# Patient Record
Sex: Male | Born: 1940 | Race: White | Hispanic: No | Marital: Married | Smoking: Former smoker
Health system: Southern US, Community
[De-identification: ages and names within clinical notes are randomized; demographics above are authoritative.]

## PROBLEM LIST (undated history)

## (undated) DIAGNOSIS — Z8739 Personal history of other diseases of the musculoskeletal system and connective tissue: Secondary | ICD-10-CM

## (undated) DIAGNOSIS — E785 Hyperlipidemia, unspecified: Secondary | ICD-10-CM

## (undated) DIAGNOSIS — N200 Calculus of kidney: Secondary | ICD-10-CM

## (undated) DIAGNOSIS — Z87442 Personal history of urinary calculi: Secondary | ICD-10-CM

## (undated) DIAGNOSIS — I10 Essential (primary) hypertension: Secondary | ICD-10-CM

## (undated) DIAGNOSIS — Z972 Presence of dental prosthetic device (complete) (partial): Secondary | ICD-10-CM

## (undated) DIAGNOSIS — R7303 Prediabetes: Secondary | ICD-10-CM

## (undated) DIAGNOSIS — N401 Enlarged prostate with lower urinary tract symptoms: Secondary | ICD-10-CM

## (undated) DIAGNOSIS — R233 Spontaneous ecchymoses: Secondary | ICD-10-CM

## (undated) DIAGNOSIS — K219 Gastro-esophageal reflux disease without esophagitis: Secondary | ICD-10-CM

## (undated) DIAGNOSIS — R972 Elevated prostate specific antigen [PSA]: Secondary | ICD-10-CM

## (undated) DIAGNOSIS — E559 Vitamin D deficiency, unspecified: Secondary | ICD-10-CM

## (undated) DIAGNOSIS — R351 Nocturia: Secondary | ICD-10-CM

## (undated) DIAGNOSIS — R238 Other skin changes: Secondary | ICD-10-CM

## (undated) HISTORY — DX: Gastro-esophageal reflux disease without esophagitis: K21.9

## (undated) HISTORY — PX: PROSTATE BIOPSY: SHX241

## (undated) HISTORY — DX: Hyperlipidemia, unspecified: E78.5

## (undated) HISTORY — DX: Essential (primary) hypertension: I10

## (undated) HISTORY — DX: Calculus of kidney: N20.0

## (undated) HISTORY — DX: Vitamin D deficiency, unspecified: E55.9

## (undated) HISTORY — DX: Prediabetes: R73.03

## (undated) HISTORY — PX: EXTRACORPOREAL SHOCK WAVE LITHOTRIPSY: SHX1557

---

## 1997-09-04 ENCOUNTER — Ambulatory Visit (HOSPITAL_COMMUNITY): Admission: RE | Admit: 1997-09-04 | Discharge: 1997-09-04 | Payer: Self-pay | Admitting: Urology

## 1998-05-01 ENCOUNTER — Encounter: Payer: Self-pay | Admitting: Internal Medicine

## 1998-05-01 ENCOUNTER — Ambulatory Visit (HOSPITAL_COMMUNITY): Admission: RE | Admit: 1998-05-01 | Discharge: 1998-05-01 | Payer: Self-pay | Admitting: Internal Medicine

## 2000-07-31 ENCOUNTER — Other Ambulatory Visit: Admission: RE | Admit: 2000-07-31 | Discharge: 2000-07-31 | Payer: Self-pay | Admitting: Urology

## 2000-07-31 ENCOUNTER — Encounter (INDEPENDENT_AMBULATORY_CARE_PROVIDER_SITE_OTHER): Payer: Self-pay

## 2006-05-23 HISTORY — PX: COLONOSCOPY: SHX174

## 2006-10-19 ENCOUNTER — Ambulatory Visit (HOSPITAL_COMMUNITY): Admission: RE | Admit: 2006-10-19 | Discharge: 2006-10-19 | Payer: Self-pay | Admitting: Urology

## 2006-11-28 ENCOUNTER — Emergency Department (HOSPITAL_COMMUNITY): Admission: EM | Admit: 2006-11-28 | Discharge: 2006-11-28 | Payer: Self-pay | Admitting: Emergency Medicine

## 2006-11-30 ENCOUNTER — Emergency Department (HOSPITAL_COMMUNITY): Admission: EM | Admit: 2006-11-30 | Discharge: 2006-11-30 | Payer: Self-pay | Admitting: Emergency Medicine

## 2007-03-05 ENCOUNTER — Ambulatory Visit: Payer: Self-pay | Admitting: Internal Medicine

## 2007-03-15 ENCOUNTER — Ambulatory Visit: Payer: Self-pay | Admitting: Internal Medicine

## 2007-03-15 LAB — HM COLONOSCOPY

## 2007-05-24 HISTORY — PX: HAND LIGAMENT RECONSTRUCTION: SHX1726

## 2009-08-10 ENCOUNTER — Emergency Department (HOSPITAL_COMMUNITY): Admission: EM | Admit: 2009-08-10 | Discharge: 2009-08-10 | Payer: Self-pay | Admitting: Emergency Medicine

## 2009-08-25 ENCOUNTER — Encounter: Admission: RE | Admit: 2009-08-25 | Discharge: 2009-08-25 | Payer: Self-pay | Admitting: General Surgery

## 2009-12-14 ENCOUNTER — Encounter: Admission: RE | Admit: 2009-12-14 | Discharge: 2010-02-12 | Payer: Self-pay | Admitting: Orthopaedic Surgery

## 2011-08-15 IMAGING — CR DG HAND COMPLETE 3+V*R*
3 series · 3 of 3 positions shown · non-contrast
Comparison: None

CLINICAL DATA: Swelling, erythema, question spider bite 10 days ago

RIGHT HAND - COMPLETE 3+ VIEW

[x hand pa right]
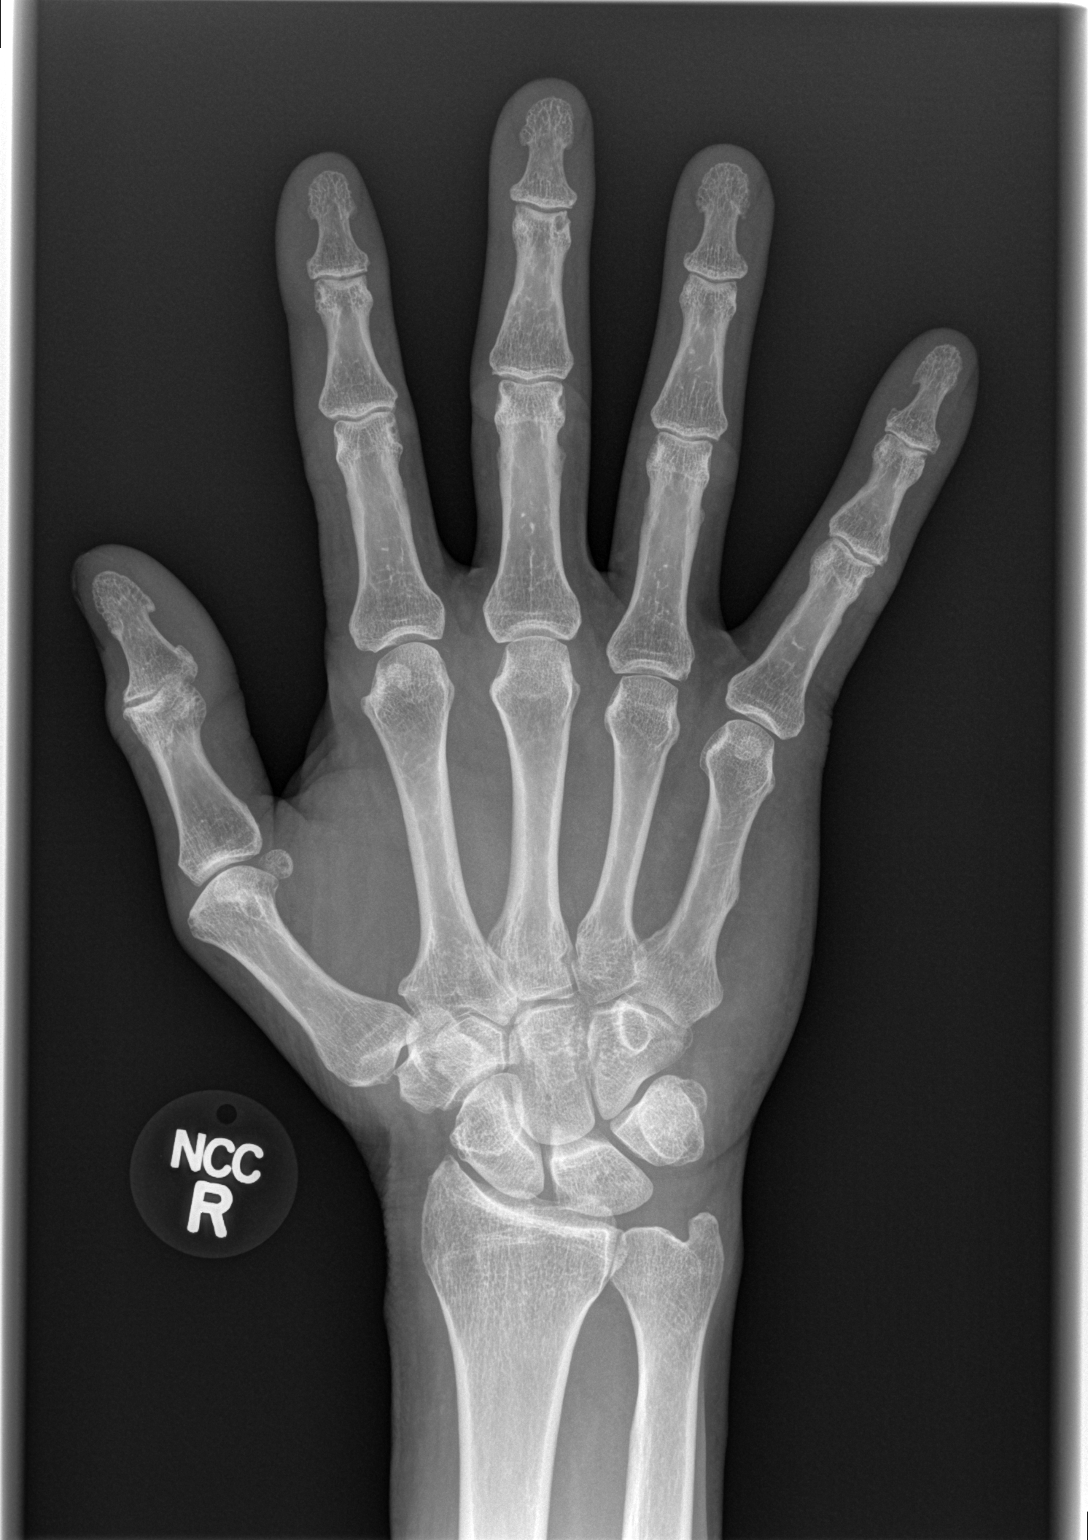

[x hand oblique right]
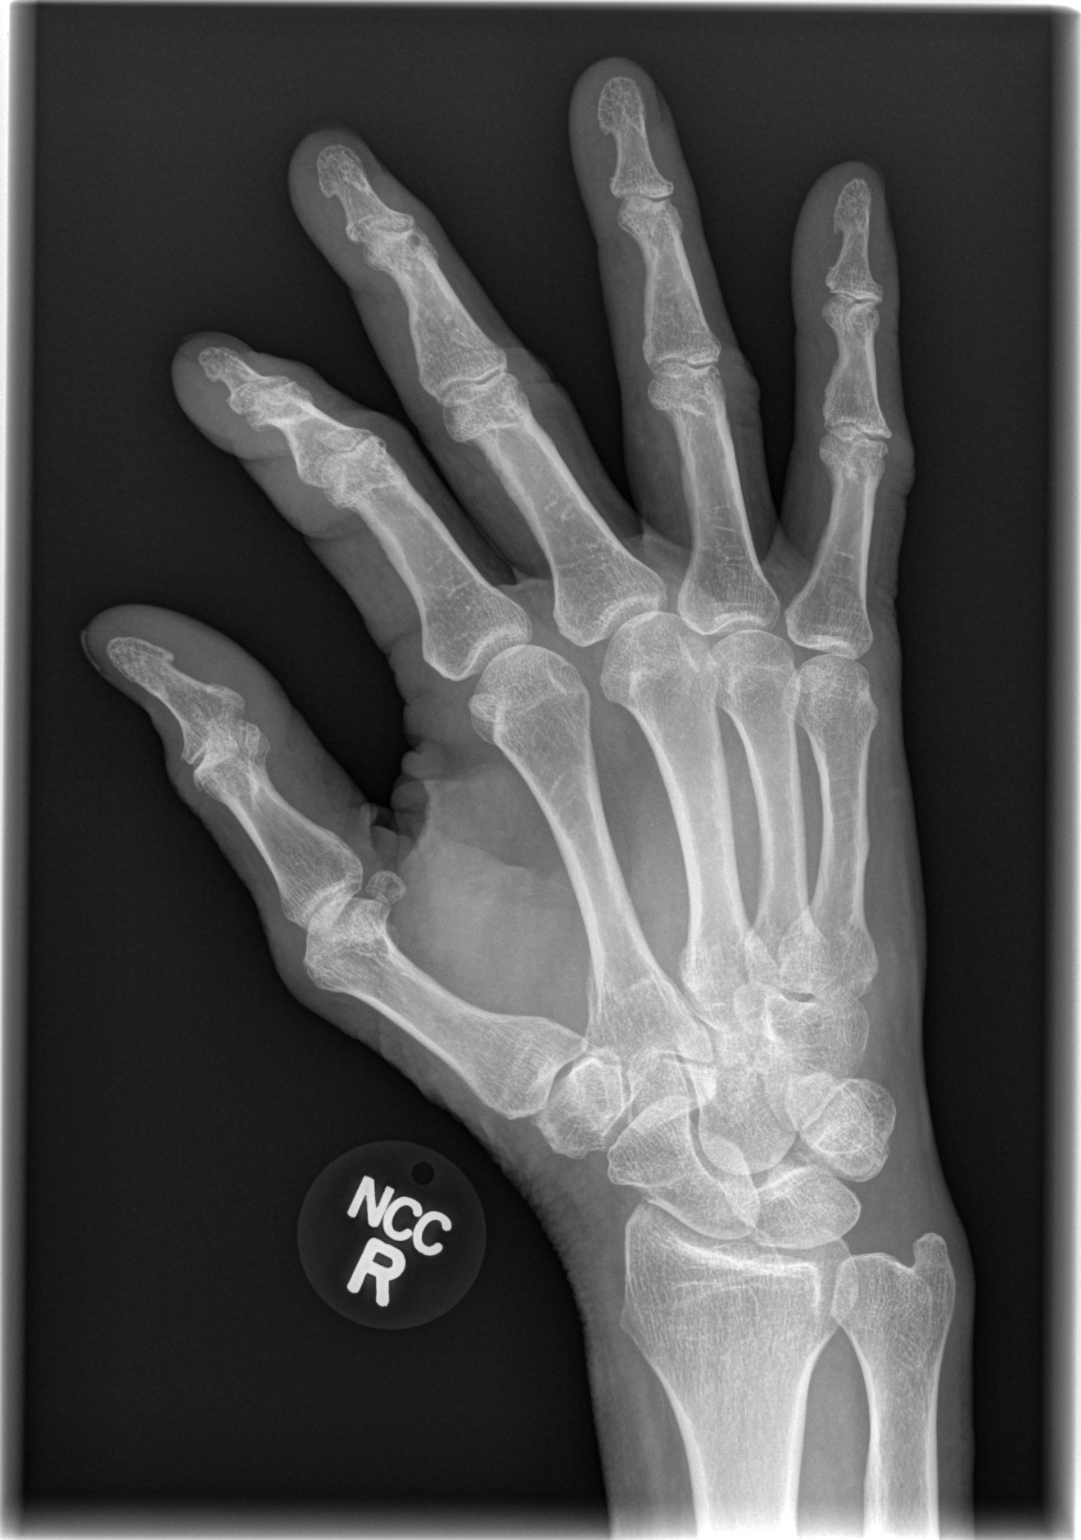

[x hand lat right]
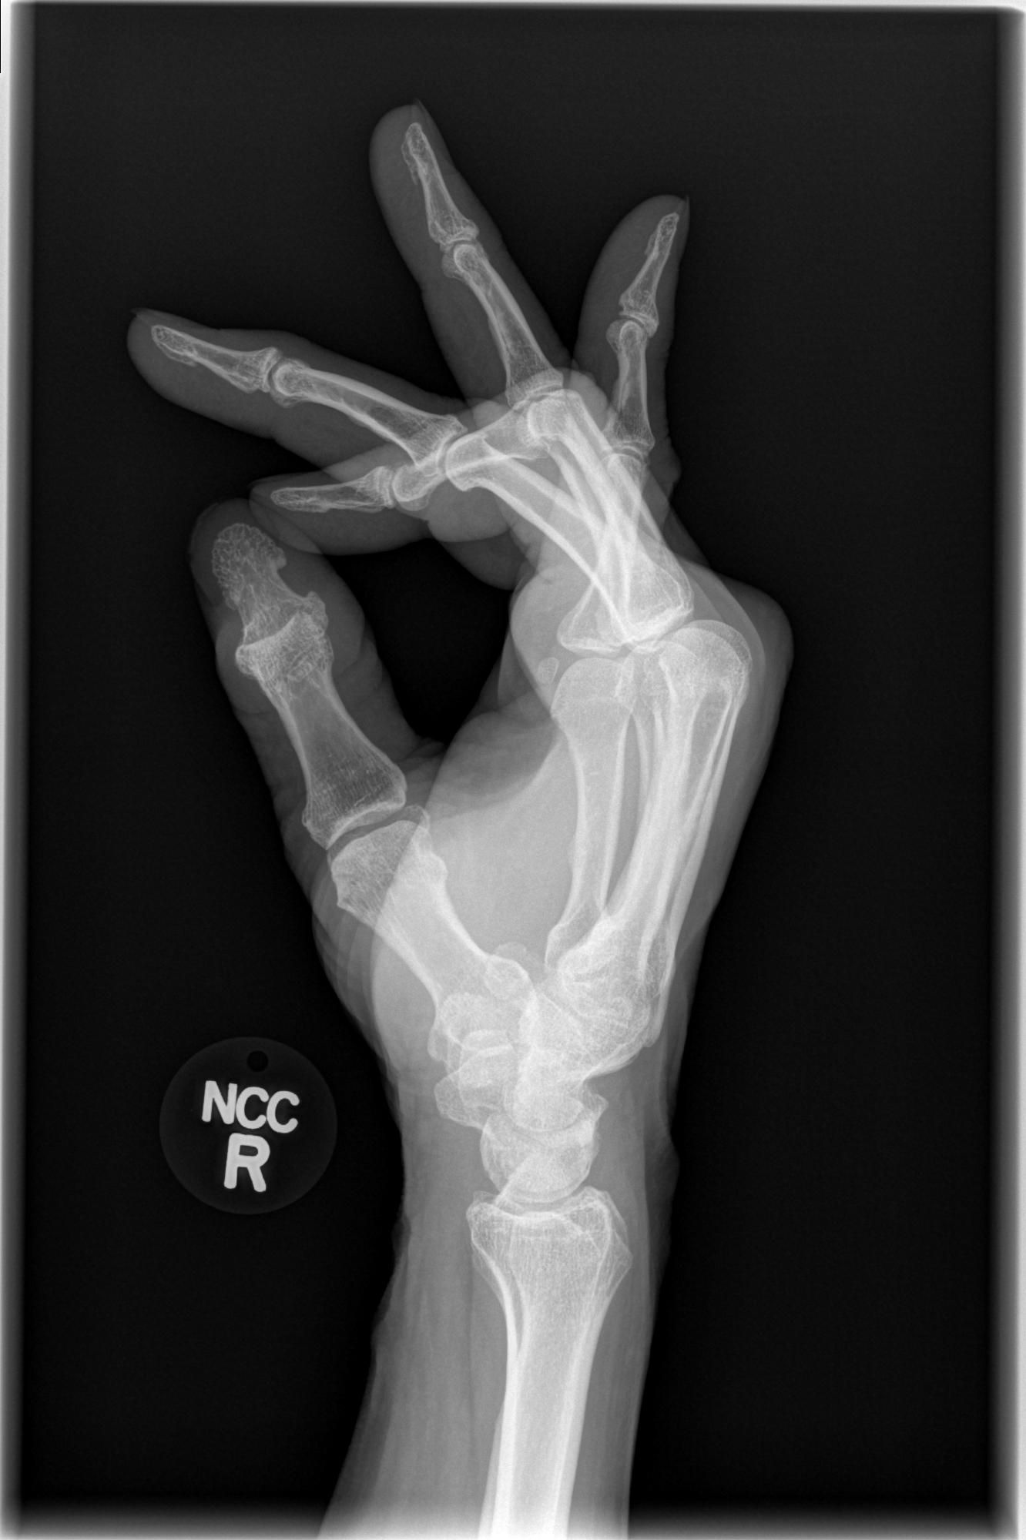

[3 of 3 positions shown; findings below may reference images not displayed]

FINDINGS: Small subchondral cystic degenerative change at DIP joint of right
middle finger.
Joint spaces preserved.
Bones appear slightly demineralized diffusely.
No acute fracture, dislocation, or acute bone destruction.
Soft tissue swelling at dorsum of right hand overlying the distal
metacarpals and metacarpal heads.
No soft tissue gas evident.
IMPRESSION: Soft tissue swelling at dorsum of right hand without acute bony
abnormalities.

## 2012-02-06 ENCOUNTER — Encounter: Payer: Self-pay | Admitting: Internal Medicine

## 2012-09-20 ENCOUNTER — Encounter: Payer: Self-pay | Admitting: Internal Medicine

## 2013-05-10 ENCOUNTER — Ambulatory Visit: Payer: Self-pay | Admitting: Physician Assistant

## 2013-05-10 ENCOUNTER — Other Ambulatory Visit: Payer: Self-pay | Admitting: Internal Medicine

## 2013-05-10 MED ORDER — ENALAPRIL MALEATE 20 MG PO TABS: 20.0000 mg | ORAL_TABLET | Freq: Two times a day (BID) | ORAL | Status: DC

## 2013-05-20 ENCOUNTER — Encounter: Payer: Self-pay | Admitting: Internal Medicine

## 2013-05-21 ENCOUNTER — Encounter (HOSPITAL_COMMUNITY): Payer: Self-pay | Admitting: Emergency Medicine

## 2013-05-21 ENCOUNTER — Emergency Department (HOSPITAL_COMMUNITY)
Admission: EM | Admit: 2013-05-21 | Discharge: 2013-05-21 | Disposition: A | Discharge status: Home / Self Care | Payer: Medicare Other | Attending: Emergency Medicine | Admitting: Emergency Medicine

## 2013-05-21 ENCOUNTER — Emergency Department (HOSPITAL_COMMUNITY): Payer: Medicare Other

## 2013-05-21 DIAGNOSIS — I10 Essential (primary) hypertension: Secondary | ICD-10-CM | POA: Insufficient documentation

## 2013-05-21 DIAGNOSIS — Z7982 Long term (current) use of aspirin: Secondary | ICD-10-CM | POA: Insufficient documentation

## 2013-05-21 DIAGNOSIS — E785 Hyperlipidemia, unspecified: Secondary | ICD-10-CM | POA: Insufficient documentation

## 2013-05-21 DIAGNOSIS — Z87442 Personal history of urinary calculi: Secondary | ICD-10-CM | POA: Insufficient documentation

## 2013-05-21 DIAGNOSIS — Z79899 Other long term (current) drug therapy: Secondary | ICD-10-CM | POA: Insufficient documentation

## 2013-05-21 DIAGNOSIS — Z8719 Personal history of other diseases of the digestive system: Secondary | ICD-10-CM | POA: Insufficient documentation

## 2013-05-21 DIAGNOSIS — N400 Enlarged prostate without lower urinary tract symptoms: Secondary | ICD-10-CM | POA: Insufficient documentation

## 2013-05-21 DIAGNOSIS — R002 Palpitations: Secondary | ICD-10-CM | POA: Insufficient documentation

## 2013-05-21 DIAGNOSIS — Z87891 Personal history of nicotine dependence: Secondary | ICD-10-CM | POA: Insufficient documentation

## 2013-05-21 DIAGNOSIS — E559 Vitamin D deficiency, unspecified: Secondary | ICD-10-CM | POA: Insufficient documentation

## 2013-05-21 LAB — CBC WITH DIFFERENTIAL/PLATELET
Basophils Relative: 0 % (ref 0–1)
Eosinophils Absolute: 0.2 10*3/uL (ref 0.0–0.7)
Eosinophils Relative: 2 % (ref 0–5)
Lymphs Abs: 2.5 10*3/uL (ref 0.7–4.0)
MCH: 32.2 pg (ref 26.0–34.0)
MCHC: 35 g/dL (ref 30.0–36.0)
MCV: 92.2 fL (ref 78.0–100.0)
Monocytes Relative: 8 % (ref 3–12)
Neutro Abs: 4.1 10*3/uL (ref 1.7–7.7)
Platelets: 174 10*3/uL (ref 150–400)
RBC: 4.9 MIL/uL (ref 4.22–5.81)
RDW: 12.8 % (ref 11.5–15.5)
WBC: 7.4 10*3/uL (ref 4.0–10.5)

## 2013-05-21 LAB — COMPREHENSIVE METABOLIC PANEL
Albumin: 4.3 g/dL (ref 3.5–5.2)
Alkaline Phosphatase: 91 U/L (ref 39–117)
BUN: 25 mg/dL — ABNORMAL HIGH (ref 6–23)
Calcium: 9.6 mg/dL (ref 8.4–10.5)
GFR calc Af Amer: >90 mL/min (ref >90)
GFR calc non Af Amer: 82 mL/min — ABNORMAL LOW (ref >90)
Glucose, Bld: 134 mg/dL — ABNORMAL HIGH (ref 70–99)
Total Protein: 7.4 g/dL (ref 6.0–8.3)

## 2013-05-21 LAB — POCT I-STAT TROPONIN I
Troponin i, poc: 0 ng/mL (ref 0.00–0.08)
Troponin i, poc: 0.01 ng/mL (ref 0.00–0.08)

## 2013-05-21 NOTE — ED Provider Notes (Signed)
72 year old male was awakened at about 10:30 PM with  gas was stuck in his epigastric area and a sense that his heart was racing. He states that the sense of gasping stuck in his epigastric area resolved about the time he arrived in ED and the sense of his heart racing resolves shortly after that. He also noted some irregularity in his heart beat. He did not check his actual heart rate. He has had similar episodes like this in the past. He had his dose of metoprolol decreased because his resting heart rate was getting too low. On exam, lungs are clear, heart has regular rate rhythm, abdomen is soft and nontender, extremities have no cyanosis or edema. Prior records are reviewed and he has no relevant ED visits or hospitalizations. At this point, it has been over 6 hours since his discomfort resolved. Initial troponin was negative. This will be repeated and he will be discharged if not showing any sign of cardiac injury. I discussed with the patient possibility of arranging to have a Holter monitor or event monitor to try and get a definitive diagnosis of his heart rhythm issue.  Medical screening examination/treatment/procedure(s) were conducted as a shared visit with non-physician practitioner(s) and myself.  I personally evaluated the patient during the encounter.  EKG Interpretation    Date/Time:  Tuesday May 21 2013 00:31:34 EST Ventricular Rate:  77 PR Interval:  172 QRS Duration: 94 QT Interval:  382 QTC Calculation: 432 R Axis:   33 Text Interpretation:  Normal sinus rhythm Normal ECG No old tracing to compare Confirmed by [facility]  MD, Kaelem Brach ([id number]) on 05/21/2013 6:52:38 AM              Dione Booze, MD 05/21/13 [phone]

## 2013-05-21 NOTE — ED Notes (Signed)
Patient resting on stretcher states he feels better and is ready to go home.

## 2013-05-21 NOTE — ED Provider Notes (Signed)
CSN: [id number]     Arrival date & time 05/21/13  0027 History   First MD Initiated Contact with Patient 05/21/13 [phone]     Chief Complaint  Patient presents with  . Palpitations   (Consider location/radiation/quality/duration/timing/severity/associated sxs/prior Treatment) HPI Patient presents to the emergency department after waking up last night with palpitations and feeling like his heart was skipping .  Patient, states, that he did not have any chest pain, shortness of breath, nausea, vomiting, headache, blurred vision, weakness, numbness, dizziness, back pain, jaw pain, fever, cough, or syncope.  The patient, states he also had some increased belching and gas.  At that time.  The patient, states she's had this similar episodes in the past.  Patient, states nothing seems make his condition, better or worse.  Patient did not have any exertional symptoms  Past Medical History  Diagnosis Date  . Hypertension   . Hyperlipidemia   . Pre-diabetes   . Vitamin D deficiency   . Kidney stones   . GERD (gastroesophageal reflux disease)   . Nephrolithiasis   . Obstructive uropathy   . BPH with elevated PSA hx  . Allergy    Past Surgical History  Procedure Laterality Date  . Lithotripsy  [phone]  . Prostate biopsy  2000, 12/2004    negative   Family History  Problem Relation Age of Onset  . Heart disease Mother   . Diabetes Mother   . Diabetes Father   . Heart disease Father   . Heart attack Father   . Diabetes Sister   . Diabetes Brother   . Diabetes Brother   . Diabetes Brother   . Heart disease Brother   . Stroke Brother   . Cancer Sister     breast  . Cancer Sister     colon   History  Substance Use Topics  . Smoking status: Former Smoker    Quit date: 05/23/1965  . Smokeless tobacco: Not on file  . Alcohol Use: No    Review of Systems All other systems negative except as documented in the HPI. All pertinent positives and negatives as reviewed in the  HPI. Allergies  Review of patient's allergies indicates no known allergies.  Home Medications   Current Outpatient Rx  Name  Route  Sig  Dispense  Refill  . ALFALFA PO   Oral   Take by mouth.         Marland Kitchen aspirin 81 MG tablet   Oral   Take 81 mg by mouth daily.         Marland Kitchen atenolol (TENORMIN) 25 MG tablet   Oral   Take 25 mg by mouth daily.         Marland Kitchen atorvastatin (LIPITOR) 40 MG tablet   Oral   Take 20 mg by mouth at bedtime.         . Cholecalciferol (VITAMIN D-3) 5000 UNITS TABS   Oral   Take 6,000 Units by mouth daily.         Marland Kitchen CRANBERRY PO   Oral   Take by mouth.         . doxazosin (CARDURA) 8 MG tablet   Oral   Take 4 mg by mouth daily.         . enalapril (VASOTEC) 20 MG tablet   Oral   Take 10 mg by mouth 2 (two) times daily.         . finasteride (PROSCAR) 5 MG tablet   Oral  Take 5 mg by mouth daily.         . Magnesium 250 MG TABS   Oral   Take 250-500 mg by mouth 2 (two) times daily.         . Omega-3 Fatty Acids (FISH OIL PO)   Oral   Take by mouth.         Marland Kitchen PRESCRIPTION MEDICATION   Oral   Take 0.5 tablets by mouth daily.          BP 126/62  Pulse 58  Temp(Src) 97 F (36.1 C) (Oral)  Resp 16  SpO2 97% Physical Exam  Nursing note and vitals reviewed. Constitutional: He is oriented to person, place, and time. He appears well-developed and well-nourished. No distress.  HENT:  Head: Normocephalic and atraumatic.  Mouth/Throat: Oropharynx is clear and moist.  Eyes: Pupils are equal, round, and reactive to light.  Neck: Normal range of motion. Neck supple.  Cardiovascular: Normal rate, regular rhythm and normal heart sounds.  Exam reveals no gallop and no friction rub.   No murmur heard. Pulmonary/Chest: Effort normal and breath sounds normal. No respiratory distress. He has no wheezes. He has no rales.  Abdominal: Soft. Normal appearance and bowel sounds are normal. He exhibits no distension, no abdominal bruit,  no pulsatile midline mass and no mass. There is no tenderness. There is no rebound and no guarding.  Neurological: He is alert and oriented to person, place, and time.  Skin: Skin is warm and dry.    ED Course  Procedures (including critical care time) Labs Review Labs Reviewed  COMPREHENSIVE METABOLIC PANEL - Abnormal; Notable for the following:    Glucose, Bld 134 (*)    BUN 25 (*)    GFR calc non Af Amer 82 (*)    All other components within normal limits  CBC WITH DIFFERENTIAL  POCT I-STAT TROPONIN I  POCT I-STAT TROPONIN I   Imaging Review Dg Chest 2 View  05/21/2013   CLINICAL DATA:  Indigestion, palpitations.  EXAM: CHEST  2 VIEW  COMPARISON:  None available for comparison at time of study interpretation. (Chest radiograph May 01, 1998 was performed though, images nor report are available).  FINDINGS: Cardiomediastinal silhouette is unremarkable. The lungs are clear without pleural effusions or focal consolidations. Mild pulmonary hyperexpansion, and scarring in the left lung base suggests COPD. Pulmonary vasculature is unremarkable. Nodular density and left perihilar region may reflect a pulmonary vessel en face. Trachea projects midline and there is no pneumothorax. Soft tissue planes and included osseous structures are nonsuspicious.  IMPRESSION: No active cardiopulmonary disease.   Electronically Signed   By: Awilda Metro   On: 05/21/2013 01:35    EKG Interpretation    Date/Time:  Tuesday May 21 2013 00:31:34 EST Ventricular Rate:  77 PR Interval:  172 QRS Duration: 94 QT Interval:  382 QTC Calculation: 432 R Axis:   33 Text Interpretation:  Normal sinus rhythm Normal ECG No old tracing to compare Confirmed by [facility]  MD, DAVID ([id number]) on 05/21/2013 6:52:38 AM           Patient is symptom-free at this time.  The patient did not have any abnormalities on his lab testing or his EKG.  Patient will be advised followup with primary care Dr. for further  evaluation and recheck.  Patient may have had some PVCs or some other arrhythmia.  We did not capture any of this here in the emergency department.  The patient is best return  here for any worsening in his condition  Carlyle Dolly, PA-C 05/21/13 1617

## 2013-05-21 NOTE — ED Notes (Signed)
Nurse first rounds : Nurse explained delay / wait time and process to pt.

## 2013-05-21 NOTE — ED Notes (Signed)
Pt. Woke up this evening with palpitations " heart skipping " , denies chest pain or SOB , pt. also stated indigestion unrelieved by OTC Mylanta .

## 2013-05-22 ENCOUNTER — Encounter: Payer: Self-pay | Admitting: Internal Medicine

## 2013-05-22 ENCOUNTER — Ambulatory Visit (INDEPENDENT_AMBULATORY_CARE_PROVIDER_SITE_OTHER): Payer: Medicare Other | Admitting: Internal Medicine

## 2013-05-22 VITALS — BP 128/78 | HR 68 | Temp 98.1°F | Resp 18 | Wt 217.4 lb

## 2013-05-22 DIAGNOSIS — M109 Gout, unspecified: Secondary | ICD-10-CM | POA: Insufficient documentation

## 2013-05-22 DIAGNOSIS — Z79899 Other long term (current) drug therapy: Secondary | ICD-10-CM

## 2013-05-22 DIAGNOSIS — I10 Essential (primary) hypertension: Secondary | ICD-10-CM

## 2013-05-22 DIAGNOSIS — N400 Enlarged prostate without lower urinary tract symptoms: Secondary | ICD-10-CM | POA: Insufficient documentation

## 2013-05-22 DIAGNOSIS — M1A00X0 Idiopathic chronic gout, unspecified site, without tophus (tophi): Secondary | ICD-10-CM | POA: Insufficient documentation

## 2013-05-22 DIAGNOSIS — R7309 Other abnormal glucose: Secondary | ICD-10-CM

## 2013-05-22 DIAGNOSIS — M1000 Idiopathic gout, unspecified site: Secondary | ICD-10-CM | POA: Insufficient documentation

## 2013-05-22 DIAGNOSIS — E790 Hyperuricemia without signs of inflammatory arthritis and tophaceous disease: Secondary | ICD-10-CM

## 2013-05-22 DIAGNOSIS — E782 Mixed hyperlipidemia: Secondary | ICD-10-CM

## 2013-05-22 DIAGNOSIS — E559 Vitamin D deficiency, unspecified: Secondary | ICD-10-CM

## 2013-05-22 DIAGNOSIS — R7303 Prediabetes: Secondary | ICD-10-CM | POA: Insufficient documentation

## 2013-05-22 LAB — CBC WITH DIFFERENTIAL/PLATELET
Basophils Absolute: 0 10*3/uL (ref 0.0–0.1)
Basophils Relative: 0 % (ref 0–1)
Eosinophils Absolute: 0.1 10*3/uL (ref 0.0–0.7)
Eosinophils Relative: 1 % (ref 0–5)
HCT: 42.9 % (ref 39.0–52.0)
MCH: 30.9 pg (ref 26.0–34.0)
MCHC: 34 g/dL (ref 30.0–36.0)
Monocytes Absolute: 0.7 10*3/uL (ref 0.1–1.0)
Monocytes Relative: 9 % (ref 3–12)
Neutro Abs: 4.8 10*3/uL (ref 1.7–7.7)
Neutrophils Relative %: 68 % (ref 43–77)
Platelets: 211 10*3/uL (ref 150–400)
RDW: 13.2 % (ref 11.5–15.5)

## 2013-05-22 LAB — BASIC METABOLIC PANEL WITH GFR
BUN: 15 mg/dL (ref 6–23)
Calcium: 9.2 mg/dL (ref 8.4–10.5)
GFR, Est African American: >89 mL/min
GFR, Est Non African American: 89 mL/min
Potassium: 4.3 mEq/L (ref 3.5–5.3)
Sodium: 139 mEq/L (ref 135–145)

## 2013-05-22 LAB — HEPATIC FUNCTION PANEL
AST: 27 U/L (ref 0–37)
Bilirubin, Direct: 0.2 mg/dL (ref 0.0–0.3)
Indirect Bilirubin: 0.7 mg/dL (ref 0.0–0.9)
Total Protein: 6.4 g/dL (ref 6.0–8.3)

## 2013-05-22 LAB — LIPID PANEL
Cholesterol: 169 mg/dL (ref 0–200)
HDL: 41 mg/dL (ref >39)
LDL Cholesterol: 96 mg/dL (ref 0–99)
Triglycerides: 158 mg/dL — ABNORMAL HIGH (ref <150)
VLDL: 32 mg/dL (ref 0–40)

## 2013-05-22 LAB — HEMOGLOBIN A1C: Mean Plasma Glucose: 126 mg/dL — ABNORMAL HIGH (ref <117)

## 2013-05-22 NOTE — Patient Instructions (Signed)

## 2013-05-22 NOTE — Progress Notes (Signed)
Patient ID: Michael Waller, male   DOB: Jun 23, 1940, 72 y.o.   MRN: [id number]   This very nice 72 y.o. [name] presents for 3 month follow up with Hypertension, Hyperlipidemia, Pre-Diabetes and Vitamin D Deficiency.    HTN predates since 62. BP has been controlled at home. Today's BP: 128/78 mmHg . Patient denies any cardiac type chest pain, palpitations, dyspnea/orthopnea/PND, dizziness, claudication, or dependent edema.   Hyperlipidemia is controlled with diet & meds. Last Cholesterol was  151, Triglycerides were 113, HDL 40 and LDL 88 in Sept.. Patient denies myalgias or other med SE's.    Also, the patient has history of PreDiabetes predating from Aug 2010 with A1c 6.1% with most recent  A1c of 5.7% in Sept.. Patient denies any symptoms of reactive hypoglycemia, diabetic polys, paresthesias or visual blurring.   Further, Patient has history of Vitamin D Deficiency in 2008 with low Vit D of 49 and most recent  Vitamin D of 86 in Sept. Patient supplements vitamin D without any suspected side-effects.   Lastly, patient has hyperuricemia  (Urate 8.1 in Sept 2013) controlled with allopurinol.  Medication Sig Dispense Refill  . ALFALFA PO Take 650 mg by mouth daily.       Marland Kitchen aspirin 81 MG tablet Take 81 mg by mouth daily.      Marland Kitchen atenolol (TENORMIN) 25 MG tablet Take 25 mg by mouth daily.      Marland Kitchen atorvastatin (LIPITOR) 40 MG tablet Take 20 mg by mouth at bedtime.      . Cholecalciferol (VITAMIN D-3) 5000 UNITS TABS Take 5,000 Units by mouth daily.       Marland Kitchen CRANBERRY PO Take by mouth.      . doxazosin (CARDURA) 8 MG tablet Take 4 mg by mouth daily.      . enalapril (VASOTEC) 20 MG tablet Take 10 mg by mouth 2 (two) times daily.      . finasteride (PROSCAR) 5 MG tablet Take 5 mg by mouth daily.      . Magnesium 250 MG TABS Take 250-500 mg by mouth 2 (two) times daily.      . Omega-3 Fatty Acids (FISH OIL PO) Take by mouth.      Marland Kitchen PRESCRIPTION MEDICATION Take 0.5 tablets by mouth daily.           No Known Allergies  PMHx:   Past Medical History  Diagnosis Date  . Hypertension   . Hyperlipidemia   . Pre-diabetes   . Vitamin D deficiency   . Kidney stones   . GERD (gastroesophageal reflux disease)   . Nephrolithiasis   . Obstructive uropathy   . BPH with elevated PSA hx  . Allergy     FHx:    Reviewed / unchanged  SHx:    Reviewed / unchanged  Systems Review: Constitutional: Denies fever, chills, wt changes, headaches, insomnia, fatigue, night sweats, change in appetite. Eyes: Denies redness, blurred vision, diplopia, discharge, itchy, watery eyes.  ENT: Denies discharge, congestion, post nasal drip, epistaxis, sore throat, earache, hearing loss, dental pain, tinnitus, vertigo, sinus pain, snoring.  CV: Denies chest pain, palpitations, irregular heartbeat, syncope, dyspnea, diaphoresis, orthopnea, PND, claudication, edema. Respiratory: denies cough, dyspnea, DOE, pleurisy, hoarseness, laryngitis, wheezing.  Gastrointestinal: Denies dysphagia, odynophagia, heartburn, reflux, water brash, abdominal pain or cramps, nausea, vomiting, bloating, diarrhea, constipation, hematemesis, melena, hematochezia,  or hemorrhoids. Genitourinary: Denies dysuria, frequency, urgency, nocturia, hesitancy, discharge, hematuria, flank pain. Musculoskeletal: Denies arthralgias, myalgias, stiffness, jt. swelling, pain, limp, strain/sprain.  Skin:  Denies pruritus, rash, hives, warts, acne, eczema, change in skin lesion(s). Neuro: No weakness, tremor, incoordination, spasms, paresthesia, or pain. Psychiatric: Denies confusion, memory loss, or sensory loss. Endo: Denies change in weight, skin, hair change.  Heme/Lymph: No excessive bleeding, bruising, orenlarged lymph nodes.  BP: 128/78  Pulse: 68  Temp: 98.1 F (36.7 C)  Resp: 18      On Exam: Appears well nourished - in no distress. Eyes: PERRLA, EOMs, conjunctiva no swelling or erythema. Sinuses: No frontal/maxillary  tenderness ENT/Mouth: EAC's clear, TM's nl w/o erythema, bulging. Nares clear w/o erythema, swelling, exudates. Oropharynx clear without erythema or exudates. Oral hygiene is good. Tongue normal, non obstructing. Hearing intact.  Neck: Supple. Thyroid nl. Car 2+/2+ without bruits, nodes or JVD. Chest: Respirations nl with BS clear & equal w/o rales, rhonchi, wheezing or stridor.  Cor: Heart sounds normal w/ regular rate and rhythm without sig. murmurs, gallops, clicks, or rubs. Peripheral pulses normal and equal  without edema.  Abdomen: Soft & bowel sounds normal. Non-tender w/o guarding, rebound, hernias, masses, or organomegaly.  Lymphatics: Unremarkable.  Musculoskeletal: Full ROM all peripheral extremities, joint stability, 5/5 strength, and normal gait.  Skin: Warm, dry without exposed rashes, lesions, ecchymosis apparent.  Neuro: Cranial nerves intact, reflexes equal bilaterally. Sensory-motor testing grossly intact. Tendon reflexes grossly intact.  Pysch: Alert & oriented x 3. Insight and judgement nl & appropriate. No ideations.  Assessment and Plan:  1. Hypertension - Continue monitor blood pressure at home. Continue diet/meds same.  2. Hyperlipidemia - Continue diet/meds, exercise,& lifestyle modifications. Continue monitor periodic cholesterol/liver & renal functions   3. Pre-diabetes/Insulin Resistance - Continue diet, exercise, lifestyle modifications. Monitor appropriate labs.  4. Vitamin D Deficiency - Continue supplementation.  5. Hyperuricemia  Recommended regular exercise, BP monitoring, weight control, and discussed med and SE's. Recommended labs to assess and monitor clinical status. Further disposition pending results of labs.

## 2013-05-23 LAB — INSULIN, FASTING: Insulin fasting, serum: 10 u[IU]/mL (ref 3–28)

## 2013-05-23 LAB — TSH: TSH: 1.28 u[IU]/mL (ref 0.350–4.500)

## 2013-05-23 LAB — VITAMIN D 25 HYDROXY (VIT D DEFICIENCY, FRACTURES): Vit D, 25-Hydroxy: 90 ng/mL — ABNORMAL HIGH (ref 30–89)

## 2013-05-24 ENCOUNTER — Telehealth: Payer: Self-pay | Admitting: *Deleted

## 2013-05-24 NOTE — Telephone Encounter (Signed)
Message copied by Emelda Brothers on Fri May 24, 2013  9:17 AM ------      Message from: Unk Pinto      Created: Thu May 23, 2013  9:12 PM       Chol 169 - excellent --- A1c 5.7% Sept -> now 6.0% back in early diabetic range - important to get on stricter diet and lose weight  - no sweets/candy - no white stuff        all else nl/ok - most important thing to do is stricter diet and lose weight ------

## 2013-06-28 ENCOUNTER — Other Ambulatory Visit: Payer: Self-pay | Admitting: Internal Medicine

## 2013-06-28 MED ORDER — ONETOUCH ULTRA SYSTEM W/DEVICE KIT: 1.0000 | PACK | Freq: Once | Status: DC

## 2013-07-01 ENCOUNTER — Other Ambulatory Visit: Payer: Self-pay | Admitting: Physician Assistant

## 2013-07-01 MED ORDER — ALLOPURINOL 300 MG PO TABS: 300.0000 mg | ORAL_TABLET | Freq: Every day | ORAL | Status: DC

## 2013-08-06 ENCOUNTER — Other Ambulatory Visit: Payer: Self-pay

## 2013-08-06 MED ORDER — ATORVASTATIN CALCIUM 40 MG PO TABS: 40.0000 mg | ORAL_TABLET | Freq: Every day | ORAL | Status: DC

## 2013-08-09 ENCOUNTER — Ambulatory Visit: Payer: Self-pay | Admitting: Internal Medicine

## 2013-08-21 ENCOUNTER — Ambulatory Visit (INDEPENDENT_AMBULATORY_CARE_PROVIDER_SITE_OTHER): Payer: Medicare Other | Admitting: Physician Assistant

## 2013-08-21 ENCOUNTER — Encounter: Payer: Self-pay | Admitting: Physician Assistant

## 2013-08-21 VITALS — BP 120/62 | HR 56 | Temp 97.9°F | Resp 16 | Ht 75.5 in | Wt 216.0 lb

## 2013-08-21 DIAGNOSIS — E559 Vitamin D deficiency, unspecified: Secondary | ICD-10-CM

## 2013-08-21 DIAGNOSIS — E790 Hyperuricemia without signs of inflammatory arthritis and tophaceous disease: Secondary | ICD-10-CM

## 2013-08-21 DIAGNOSIS — Z79899 Other long term (current) drug therapy: Secondary | ICD-10-CM

## 2013-08-21 DIAGNOSIS — R7309 Other abnormal glucose: Secondary | ICD-10-CM

## 2013-08-21 DIAGNOSIS — Z0000 Encounter for general adult medical examination without abnormal findings: Secondary | ICD-10-CM

## 2013-08-21 DIAGNOSIS — N400 Enlarged prostate without lower urinary tract symptoms: Secondary | ICD-10-CM

## 2013-08-21 DIAGNOSIS — I10 Essential (primary) hypertension: Secondary | ICD-10-CM

## 2013-08-21 DIAGNOSIS — E782 Mixed hyperlipidemia: Secondary | ICD-10-CM

## 2013-08-21 DIAGNOSIS — Z1331 Encounter for screening for depression: Secondary | ICD-10-CM

## 2013-08-21 LAB — CBC WITH DIFFERENTIAL/PLATELET
BASOS ABS: 0 10*3/uL (ref 0.0–0.1)
Basophils Relative: 0 % (ref 0–1)
EOS ABS: 0.1 10*3/uL (ref 0.0–0.7)
EOS PCT: 2 % (ref 0–5)
HCT: 42.1 % (ref 39.0–52.0)
Hemoglobin: 14.7 g/dL (ref 13.0–17.0)
Lymphocytes Relative: 26 % (ref 12–46)
Lymphs Abs: 1.6 10*3/uL (ref 0.7–4.0)
MCH: 31.5 pg (ref 26.0–34.0)
MCHC: 34.9 g/dL (ref 30.0–36.0)
MCV: 90.1 fL (ref 78.0–100.0)
MONO ABS: 0.5 10*3/uL (ref 0.1–1.0)
MONOS PCT: 8 % (ref 3–12)
Neutro Abs: 3.9 10*3/uL (ref 1.7–7.7)
Neutrophils Relative %: 64 % (ref 43–77)
Platelets: 187 10*3/uL (ref 150–400)
RBC: 4.67 MIL/uL (ref 4.22–5.81)
RDW: 13.4 % (ref 11.5–15.5)
WBC: 6.1 10*3/uL (ref 4.0–10.5)

## 2013-08-21 LAB — BASIC METABOLIC PANEL WITH GFR
BUN: 17 mg/dL (ref 6–23)
CALCIUM: 9.2 mg/dL (ref 8.4–10.5)
CO2: 27 mEq/L (ref 19–32)
CREATININE: 0.73 mg/dL (ref 0.50–1.35)
Chloride: 101 mEq/L (ref 96–112)
GLUCOSE: 104 mg/dL — AB (ref 70–99)
POTASSIUM: 4.2 meq/L (ref 3.5–5.3)
Sodium: 139 mEq/L (ref 135–145)

## 2013-08-21 LAB — TSH: TSH: 1.52 u[IU]/mL (ref 0.350–4.500)

## 2013-08-21 LAB — HEPATIC FUNCTION PANEL
ALBUMIN: 4.3 g/dL (ref 3.5–5.2)
ALT: 35 U/L (ref 0–53)
AST: 66 U/L — ABNORMAL HIGH (ref 0–37)
Alkaline Phosphatase: 66 U/L (ref 39–117)
BILIRUBIN INDIRECT: 0.7 mg/dL (ref 0.2–1.2)
Bilirubin, Direct: 0.2 mg/dL (ref 0.0–0.3)
TOTAL PROTEIN: 6.5 g/dL (ref 6.0–8.3)
Total Bilirubin: 0.9 mg/dL (ref 0.2–1.2)

## 2013-08-21 LAB — LIPID PANEL
Cholesterol: 159 mg/dL (ref 0–200)
HDL: 42 mg/dL (ref >39)
LDL Cholesterol: 96 mg/dL (ref 0–99)
TRIGLYCERIDES: 106 mg/dL (ref <150)
Total CHOL/HDL Ratio: 3.8 Ratio
VLDL: 21 mg/dL (ref 0–40)

## 2013-08-21 LAB — HEMOGLOBIN A1C
Hgb A1c MFr Bld: 5.7 % — ABNORMAL HIGH (ref <5.7)
MEAN PLASMA GLUCOSE: 117 mg/dL — AB (ref <117)

## 2013-08-21 LAB — MAGNESIUM: MAGNESIUM: 1.8 mg/dL (ref 1.5–2.5)

## 2013-08-21 NOTE — Patient Instructions (Signed)
 Bad carbs also include fruit juice, alcohol, and sweet tea. These are empty calories that do not signal to your brain that you are full.   Please remember the good carbs are still carbs which convert into sugar. So please measure them out no more than 1/2-1 cup of rice, oatmeal, pasta, and beans.  Veggies are however free foods! Pile them on.   I like lean protein at every meal such as chicken, turkey, pork chops, cottage cheese, etc. Just do not fry these meats and please center your meal around vegetable, the meats should be a side dish.   No all fruit is created equal. Please see the list below, the fruit at the bottom is higher in sugars than the fruit at the top   Preventative Care for Adults, Male       REGULAR HEALTH EXAMS:  A routine yearly physical is a good way to check in with your primary care provider about your health and preventive screening. It is also an opportunity to share updates about your health and any concerns you have, and receive a thorough all-over exam.   Most health insurance companies pay for at least some preventative services.  Check with your health plan for specific coverages.  WHAT PREVENTATIVE SERVICES DO MEN NEED?  Adult men should have their weight and blood pressure checked regularly.   Men age 35 and older should have their cholesterol levels checked regularly.  Beginning at age 50 and continuing to age 75, men should be screened for colorectal cancer.  Certain people should may need continued testing until age 85.  Other cancer screening may include exams for testicular and prostate cancer.  Updating vaccinations is part of preventative care.  Vaccinations help protect against diseases such as the flu.  Lab tests are generally done as part of preventative care to screen for anemia and blood disorders, to screen for problems with the kidneys and liver, to screen for bladder problems, to check blood sugar, and to check your cholesterol  level.  Preventative services generally include counseling about diet, exercise, avoiding tobacco, drugs, excessive alcohol consumption, and sexually transmitted infections.    GENERAL RECOMMENDATIONS FOR GOOD HEALTH:  Healthy diet:  Eat a variety of foods, including fruit, vegetables, animal or vegetable protein, such as meat, fish, chicken, and eggs, or beans, lentils, tofu, and grains, such as rice.  Drink plenty of water daily.  Decrease saturated fat in the diet, avoid lots of red meat, processed foods, sweets, fast foods, and fried foods.  Exercise:  Aerobic exercise helps maintain good heart health. At least 30-40 minutes of moderate-intensity exercise is recommended. For example, a brisk walk that increases your heart rate and breathing. This should be done on most days of the week.   Find a type of exercise or a variety of exercises that you enjoy so that it becomes a part of your daily life.  Examples are running, walking, swimming, water aerobics, and biking.  For motivation and support, explore group exercise such as aerobic class, spin class, Zumba, Yoga,or  martial arts, etc.    Set exercise goals for yourself, such as a certain weight goal, walk or run in a race such as a 5k walk/run.  Speak to your primary care provider about exercise goals.  Disease prevention:  If you smoke or chew tobacco, find out from your caregiver how to quit. It can literally save your life, no matter how long you have been a tobacco user. If you   do not use tobacco, never begin.   Maintain a healthy diet and normal weight. Increased weight leads to problems with blood pressure and diabetes.   The Body Mass Index or BMI is a way of measuring how much of your body is fat. Having a BMI above 27 increases the risk of heart disease, diabetes, hypertension, stroke and other problems related to obesity. Your caregiver can help determine your BMI and based on it develop an exercise and dietary program to  help you achieve or maintain this important measurement at a healthful level.  High blood pressure causes heart and blood vessel problems.  Persistent high blood pressure should be treated with medicine if weight loss and exercise do not work.   Fat and cholesterol leaves deposits in your arteries that can block them. This causes heart disease and vessel disease elsewhere in your body.  If your cholesterol is found to be high, or if you have heart disease or certain other medical conditions, then you may need to have your cholesterol monitored frequently and be treated with medication.   Ask if you should have a stress test if your history suggests this. A stress test is a test done on a treadmill that looks for heart disease. This test can find disease prior to there being a problem.  Avoid drinking alcohol in excess (more than two drinks per day).  Avoid use of street drugs. Do not share needles with anyone. Ask for professional help if you need assistance or instructions on stopping the use of alcohol, cigarettes, and/or drugs.  Brush your teeth twice a day with fluoride toothpaste, and floss once a day. Good oral hygiene prevents tooth decay and gum disease. The problems can be painful, unattractive, and can cause other health problems. Visit your dentist for a routine oral and dental check up and preventive care every 6-12 months.   Look at your skin regularly.  Use a mirror to look at your back. Notify your caregivers of changes in moles, especially if there are changes in shapes, colors, a size larger than a pencil eraser, an irregular border, or development of new moles.  Safety:  Use seatbelts 100% of the time, whether driving or as a passenger.  Use safety devices such as hearing protection if you work in environments with loud noise or significant background noise.  Use safety glasses when doing any work that could send debris in to the eyes.  Use a helmet if you ride a bike or motorcycle.   Use appropriate safety gear for contact sports.  Talk to your caregiver about gun safety.  Use sunscreen with a SPF (or skin protection factor) of 15 or greater.  Lighter skinned people are at a greater risk of skin cancer. Don't forget to also wear sunglasses in order to protect your eyes from too much damaging sunlight. Damaging sunlight can accelerate cataract formation.   Practice safe sex. Use condoms. Condoms are used for birth control and to help reduce the spread of sexually transmitted infections (or STIs).  Some of the STIs are gonorrhea (the clap), chlamydia, syphilis, trichomonas, herpes, HPV (human papilloma virus) and HIV (human immunodeficiency virus) which causes AIDS. The herpes, HIV and HPV are viral illnesses that have no cure. These can result in disability, cancer and death.   Keep carbon monoxide and smoke detectors in your home functioning at all times. Change the batteries every 6 months or use a model that plugs into the wall.   Vaccinations:  Stay   up to date with your tetanus shots and other required immunizations. You should have a booster for tetanus every 10 years. Be sure to get your flu shot every year, since 5%-20% of the [place] population comes down with the flu. The flu vaccine changes each year, so being vaccinated once is not enough. Get your shot in the fall, before the flu season peaks.   Other vaccines to consider:  Pneumococcal vaccine to protect against certain types of pneumonia.  This is normally recommended for adults age 65 or older.  However, adults younger than 73 years old with certain underlying conditions such as diabetes, heart or lung disease should also receive the vaccine.  Shingles vaccine to protect against Varicella Zoster if you are older than age 60, or younger than 73 years old with certain underlying illness.  Hepatitis A vaccine to protect against a form of infection of the liver by a virus acquired from food.  Hepatitis B vaccine to  protect against a form of infection of the liver by a virus acquired from blood or body fluids, particularly if you work in health care.  If you plan to travel internationally, check with your local health department for specific vaccination recommendations.  Cancer Screening:  Most routine colon cancer screening begins at the age of 50. On a yearly basis, doctors may provide special easy to use take-home tests to check for hidden blood in the stool. Sigmoidoscopy or colonoscopy can detect the earliest forms of colon cancer and is life saving. These tests use a small camera at the end of a tube to directly examine the colon. Speak to your caregiver about this at age 50, when routine screening begins (and is repeated every 5 years unless early forms of pre-cancerous polyps or small growths are found).   At the age of 50 men usually start screening for prostate cancer every year. Screening may begin at a younger age for those with higher risk. Those at higher risk include African-Americans or having a family history of prostate cancer. There are two types of tests for prostate cancer:   Prostate-specific antigen (PSA) testing. Recent studies raise questions about prostate cancer using PSA and you should discuss this with your caregiver.   Digital rectal exam (in which your doctor's lubricated and gloved finger feels for enlargement of the prostate through the anus).   Screening for testicular cancer.  Do a monthly exam of your testicles. Gently roll each testicle between your thumb and fingers, feeling for any abnormal lumps. The best time to do this is after a hot shower or bath when the tissues are looser. Notify your caregivers of any lumps, tenderness or changes in size or shape immediately.     

## 2013-08-21 NOTE — Progress Notes (Signed)
Subjective:  Michael Waller is a 73 y.o. male who presents for Medicare Annual Wellness Visit and 3 month follow up for HTN, hyperlipidemia, prediabetes, and vitamin D Def.  Date of last medicare wellness visit was is unknown.  His blood pressure has been controlled at home, today their BP is BP: 120/62 mmHg He does not workout, but he stays active in his yard and plants a garden every year that he has to tend. He denies chest pain, shortness of breath, dizziness.  He is on cholesterol medication and denies myalgias. His cholesterol is not at goal. The cholesterol last visit was:   Lab Results  Component Value Date   CHOL 169 05/22/2013   HDL 41 05/22/2013   LDLCALC 96 05/22/2013   TRIG 158* 05/22/2013   CHOLHDL 4.1 05/22/2013   He has been working on diet and exercise for prediabetes, and denies hypoglycemia , nausea, polydipsia, polyuria and visual disturbances. Last A1C in the office was:  Lab Results  Component Value Date   HGBA1C 6.0* 05/22/2013   Patient is on Vitamin D supplement.   He states he takes his sister to dialysis three times a week.  States that the Mag helped his feet cramping, no diarrhea, but he does continue to have some burning on the bottom of both of his feet.  Names of Other Physician/Practitioners you currently use: 1. [facility] here for primary care 2. Dr. Sabra Heck, eye doctor, last visit yearly 4. Dr. Jeffie Pollock 5. Dr. Ninfa Linden 6. Dr. Carlean Purl Patient Care Team: Unk Pinto, MD as PCP - General (Internal Medicine)  Medication Review: Current Outpatient Prescriptions on File Prior to Visit  Medication Sig Dispense Refill  . ALFALFA PO Take 650 mg by mouth daily.       Marland Kitchen allopurinol (ZYLOPRIM) 300 MG tablet Take 1 tablet (300 mg total) by mouth daily.  90 tablet  1  . aspirin 81 MG tablet Take 81 mg by mouth daily.      Marland Kitchen atenolol (TENORMIN) 25 MG tablet Take 25 mg by mouth daily.      Marland Kitchen atorvastatin (LIPITOR) 40  MG tablet Take 1 tablet (40 mg total) by mouth at bedtime.  30 tablet  1  . Blood Glucose Monitoring Suppl (ONE TOUCH ULTRA SYSTEM KIT) W/DEVICE KIT 1 kit by Does not apply route once. test strips and lancets  (#100) - check glucose qd - Dx 250.00 / Magnus Sinning MD / NPI [id number] /  100 each  12  . Cholecalciferol (VITAMIN D-3) 5000 UNITS TABS Take 5,000 Units by mouth daily.       Marland Kitchen CRANBERRY PO Take by mouth.      . doxazosin (CARDURA) 8 MG tablet Take 4 mg by mouth daily.      . enalapril (VASOTEC) 20 MG tablet Take 10 mg by mouth 2 (two) times daily.      . finasteride (PROSCAR) 5 MG tablet Take 5 mg by mouth daily.      . Garlic 7026 MG CAPS Take 1,000 mg by mouth daily.      . Magnesium 250 MG TABS Take 250-500 mg by mouth 3 (three) times daily.       . Omega-3 Fatty Acids (FISH OIL PO) Take by mouth.      Marland Kitchen PRESCRIPTION MEDICATION Take 0.5 tablets by mouth daily.       No current facility-administered medications on file prior to visit.    Current Problems (verified) Patient Active  Problem List   Diagnosis Date Noted  . Unspecified essential hypertension 05/22/2013  . Mixed hyperlipidemia 05/22/2013  . Other abnormal glucose 05/22/2013  . Unspecified vitamin D deficiency 05/22/2013  . BPH (benign prostatic hypertrophy) 05/22/2013  . Hyperuricemia 05/22/2013    Screening Tests Health Maintenance  Topic Date Due  . Tetanus/tdap  11/05/1959  . Zostavax  11/04/2000  . Pneumococcal Polysaccharide Vaccine Age 35 And Over  11/04/2005  . Influenza Vaccine  12/21/2012  . Colonoscopy  03/14/2017    Immunization History  Administered Date(s) Administered  . DTaP 08/31/2005    Preventative care: Last colonoscopy: 2008  Prior vaccinations: TD or Tdap: 2007  Influenza: declines  Pneumococcal: declines Shingles/Zostavax: declines  History reviewed: allergies, current medications, past family history, past medical history, past social history, past surgical history and  problem list   Risk Factors: Tobacco History  Substance Use Topics  . Smoking status: Former Smoker    Quit date: 05/23/1965  . Smokeless tobacco: Not on file  . Alcohol Use: No   He does not smoke.  Patient is a former smoker. Are there smokers in your home (other than you)?  No  Alcohol Current alcohol use: none  Caffeine Current caffeine use: denies use  Exercise Current exercise habits: The patient does not participate in regular exercise at present.  Current exercise: yard work  Nutrition/Diet Current diet: in general, a "healthy" diet    Cardiac risk factors: advanced age (older than 83 for men, 60 for women), dyslipidemia, family history of premature cardiovascular disease, hypertension, male gender and sedentary lifestyle.  Depression Screen Nurse depression screen reviewed.  (Note: if answer to either of the following is "Yes", a more complete depression screening is indicated)   Q1: Over the past two weeks, have you felt down, depressed or hopeless? No  Q2: Over the past two weeks, have you felt little interest or pleasure in doing things? No  Have you lost interest or pleasure in daily life? No  Do you often feel hopeless? No  Do you cry easily over simple problems? No  Activities of Daily Living Nurse ADLs screen reviewed.  In your present state of health, do you have any difficulty performing the following activities?:  Driving? No Managing money?  No Feeding yourself? No Getting from bed to chair? No Climbing a flight of stairs? No Preparing food and eating?: No Bathing or showering? No Getting dressed: No Getting to the toilet? No Using the toilet:No Moving around from place to place: No In the past year have you fallen or had a near fall?:Yes, he states he was picking beans and a large spider caused him to fall backwards   Are you sexually active?  No  Do you have more than one partner?  No  Vision Difficulties: No  Hearing Difficulties:  Yes but only in his right ear due to an injury/loud noise years ago Do you often ask people to speak up or repeat themselves? Yes Do you experience ringing or noises in your ears? Yes Do you have difficulty understanding soft or whispered voices? No  Cognition  Do you feel that you have a problem with memory?No  Do you often misplace items? No  Do you feel safe at home?  Yes  Advanced directives Does patient have a [place]? No Does patient have a Living Will? No   Objective:     Vision and hearing screens reviewed.   Blood pressure 120/62, pulse 56, temperature 97.9  F (36.6 C), resp. rate 16, height 6' 3.5" (1.918 m), weight 216 lb (97.977 kg). Body mass index is 26.63 kg/(m^2).  General appearance: alert, no distress, WD/WN, male Cognitive Testing  Alert? Yes  Normal Appearance?Yes  Oriented to person? Yes  Place? Yes   Time? Yes  Recall of three objects?  Yes  Can perform simple calculations? Yes  Displays appropriate judgment?Yes  Can read the correct time from a watch face?Yes  HEENT: normocephalic, sclerae anicteric, TMs pearly, nares patent, no discharge or erythema, pharynx normal Oral cavity: MMM, no lesions Neck: supple, no lymphadenopathy, no thyromegaly, no masses Heart: RRR, normal S1, S2, no murmurs Lungs: CTA bilaterally, no wheezes, rhonchi, or rales Abdomen: +bs, soft, non tender, non distended, no masses, no hepatomegaly, no splenomegaly Musculoskeletal: nontender, no swelling, no obvious deformity Extremities: 1-2 + edema, no cyanosis, no clubbing Pulses: 2+ symmetric, upper and lower extremities, normal cap refill Neurological: alert, oriented x 3, CN2-12 intact, strength normal upper extremities and lower extremities, sensation decreased only on plantar foot, DTRs 2+ throughout, no cerebellar signs, gait normal Psychiatric: normal affect, behavior normal, pleasant   Assessment:   1. Unspecified essential hypertension - CBC  with Differential - BASIC METABOLIC PANEL WITH GFR - Hepatic function panel - TSH  2. BPH (benign prostatic hypertrophy) Continue to follow up with Dr. Jeffie Pollock  3. Mixed hyperlipidemia - Lipid panel  4. Other abnormal glucose -Discussed general issues about diabetes pathophysiology and management., Educational material distributed., Suggested low cholesterol diet., Encouraged aerobic exercise., Discussed foot care., Reminded to get yearly retinal exam. - Hemoglobin A1c - HM DIABETES FOOT EXAM  5. Unspecified vitamin D deficiency - continue supplement  6. Hyperuricemia - no recent episodes  7. Encounter for long-term (current) use of other medications - Magnesium  8. Screening for depression negative   Plan:   During the course of the visit the patient was educated and counseled about appropriate screening and preventive services including:    Pneumococcal vaccine   Influenza vaccine  Td vaccine  Screening electrocardiogram  Colorectal cancer screening  Diabetes screening  Glaucoma screening  Nutrition counseling   Advanced directives: has NO advanced directive - not interested in additional information  Screening recommendations, referrals: Vaccinations: Tdap vaccine no - uptodate Influenza vaccine Patient declines, understands the risks of not getting the vaccine Pneumococcal vaccine Patient declines, understands the risks of not getting the vaccine Shingles vaccine Patient declines, understands the risks of not getting the vaccine Hep B vaccine no  Nutrition assessed and recommended  Colonoscopy yes- due 2018 Recommended yearly ophthalmology/optometry visit for glaucoma screening and checkup Recommended yearly dental visit for hygiene and checkup Advanced directives - yes  Conditions/risks identified: BMI: Discussed weight loss, diet, and increase physical activity.  Increase physical activity: AHA recommends 150 minutes of physical activity a week.   Medications reviewed Diabetes is at goal, ACE/ARB therapy: Yes. Urinary Incontinence is not an issue: discussed non pharmacology and pharmacology options.  Fall risk: low- discussed PT, home fall assessment, medications.    Medicare Attestation I have personally reviewed: The patient's medical and social history Their use of alcohol, tobacco or illicit drugs Their current medications and supplements The patient's functional ability including ADLs,fall risks, home safety risks, cognitive, and hearing and visual impairment Diet and physical activities Evidence for depression or mood disorders  The patient's weight, height, BMI, and visual acuity have been recorded in the chart.  I have made referrals, counseling, and provided education to the patient based on  review of the above and I have provided the patient with a written personalized care plan for preventive services.     Vicie Mutters, PA-C   08/21/2013

## 2013-08-23 ENCOUNTER — Encounter: Payer: Self-pay | Admitting: Internal Medicine

## 2013-10-07 ENCOUNTER — Other Ambulatory Visit: Payer: Self-pay

## 2013-10-07 MED ORDER — ATORVASTATIN CALCIUM 40 MG PO TABS: 40.0000 mg | ORAL_TABLET | Freq: Every day | ORAL | Status: DC

## 2013-10-14 ENCOUNTER — Encounter (HOSPITAL_COMMUNITY): Payer: Self-pay | Admitting: Emergency Medicine

## 2013-10-14 ENCOUNTER — Emergency Department (HOSPITAL_COMMUNITY)
Admission: EM | Admit: 2013-10-14 | Discharge: 2013-10-14 | Disposition: A | Discharge status: Home / Self Care | Payer: Medicare Other | Attending: Emergency Medicine | Admitting: Emergency Medicine

## 2013-10-14 DIAGNOSIS — Z87891 Personal history of nicotine dependence: Secondary | ICD-10-CM | POA: Insufficient documentation

## 2013-10-14 DIAGNOSIS — Z792 Long term (current) use of antibiotics: Secondary | ICD-10-CM | POA: Insufficient documentation

## 2013-10-14 DIAGNOSIS — I10 Essential (primary) hypertension: Secondary | ICD-10-CM | POA: Insufficient documentation

## 2013-10-14 DIAGNOSIS — Z7982 Long term (current) use of aspirin: Secondary | ICD-10-CM | POA: Insufficient documentation

## 2013-10-14 DIAGNOSIS — E559 Vitamin D deficiency, unspecified: Secondary | ICD-10-CM | POA: Insufficient documentation

## 2013-10-14 DIAGNOSIS — L02419 Cutaneous abscess of limb, unspecified: Secondary | ICD-10-CM | POA: Insufficient documentation

## 2013-10-14 DIAGNOSIS — Z8719 Personal history of other diseases of the digestive system: Secondary | ICD-10-CM | POA: Insufficient documentation

## 2013-10-14 DIAGNOSIS — Z87442 Personal history of urinary calculi: Secondary | ICD-10-CM | POA: Insufficient documentation

## 2013-10-14 DIAGNOSIS — L0390 Cellulitis, unspecified: Secondary | ICD-10-CM

## 2013-10-14 DIAGNOSIS — Z9283 Personal history of failed moderate sedation: Secondary | ICD-10-CM | POA: Insufficient documentation

## 2013-10-14 DIAGNOSIS — Z79899 Other long term (current) drug therapy: Secondary | ICD-10-CM | POA: Insufficient documentation

## 2013-10-14 DIAGNOSIS — L03119 Cellulitis of unspecified part of limb: Principal | ICD-10-CM

## 2013-10-14 DIAGNOSIS — E785 Hyperlipidemia, unspecified: Secondary | ICD-10-CM | POA: Insufficient documentation

## 2013-10-14 MED ORDER — SULFAMETHOXAZOLE-TMP DS 800-160 MG PO TABS: 1.0000 | ORAL_TABLET | Freq: Three times a day (TID) | ORAL | Status: DC

## 2013-10-14 NOTE — ED Notes (Signed)
Reports having a red bump or abscess to right thigh. Denies any fever/chiills. Reports burning pain to leg.

## 2013-10-14 NOTE — Discharge Instructions (Signed)
Cellulitis Cellulitis is an infection of the skin and the tissue beneath it. The infected area is usually red and tender. Cellulitis occurs most often in the arms and lower legs.  CAUSES  Cellulitis is caused by bacteria that enter the skin through cracks or cuts in the skin. The most common types of bacteria that cause cellulitis are Staphylococcus and Streptococcus. SYMPTOMS   Redness and warmth.  Swelling.  Tenderness or pain.  Fever. DIAGNOSIS  Your caregiver can usually determine what is wrong based on a physical exam. Blood tests may also be done. TREATMENT  Treatment usually involves taking an antibiotic medicine. HOME CARE INSTRUCTIONS   Take your antibiotics as directed. Finish them even if you start to feel better.  Keep the infected arm or leg elevated to reduce swelling.  Apply a warm cloth to the affected area up to 4 times per day to relieve pain.  Only take over-the-counter or prescription medicines for pain, discomfort, or fever as directed by your caregiver.  Keep all follow-up appointments as directed by your caregiver. SEEK MEDICAL CARE IF:   You notice red streaks coming from the infected area.  Your red area gets larger or turns dark in color.  Your bone or joint underneath the infected area becomes painful after the skin has healed.  Your infection returns in the same area or another area.  You notice a swollen bump in the infected area.  You develop new symptoms. SEEK IMMEDIATE MEDICAL CARE IF:   You have a fever.  You feel very sleepy.  You develop vomiting or diarrhea.  You have a general ill feeling (malaise) with muscle aches and pains. MAKE SURE YOU:   Understand these instructions.  Will watch your condition.  Will get help right away if you are not doing well or get worse. Document Released: 02/16/2005 Document Revised: 11/08/2011 Document Reviewed: 07/25/2011 [employer] Patient Information 2014 [employer].  

## 2013-10-14 NOTE — ED Provider Notes (Signed)
CSN: [id number]     Arrival date & time 10/14/13  1305 History   First MD Initiated Contact with Patient 10/14/13 1556     Chief Complaint  Patient presents with  . Abscess     (Consider location/radiation/quality/duration/timing/severity/associated sxs/prior Treatment) Patient is a 73 y.o. male presenting with abscess. The history is provided by the patient.  Abscess Associated symptoms: no fever    patient presents with tender reddened area to his right thigh. States it started 4 days ago. It started as a small area in the center. It then enlarged. No drainage. He states it is always been dark in the middle. Do not see a bite, but had been working outside. No fevers. No history of abscesses.  Past Medical History  Diagnosis Date  . Hypertension   . Hyperlipidemia   . Pre-diabetes   . Vitamin D deficiency   . Kidney stones   . GERD (gastroesophageal reflux disease)   . Nephrolithiasis   . Obstructive uropathy   . BPH with elevated PSA hx  . Allergy    Past Surgical History  Procedure Laterality Date  . Lithotripsy  [phone]  . Prostate biopsy  2000, 12/2004    negative   Family History  Problem Relation Age of Onset  . Heart disease Mother   . Diabetes Mother   . Diabetes Father   . Heart disease Father   . Heart attack Father   . Diabetes Sister   . Diabetes Brother   . Diabetes Brother   . Diabetes Brother   . Heart disease Brother   . Stroke Brother   . Cancer Sister     breast  . Cancer Sister     colon   History  Substance Use Topics  . Smoking status: Former Smoker    Quit date: 05/23/1965  . Smokeless tobacco: Not on file  . Alcohol Use: No    Review of Systems  Constitutional: Negative for fever and appetite change.  Musculoskeletal: Negative for myalgias.  Skin: Positive for color change and rash.      Allergies  Review of patient's allergies indicates no known allergies.  Home Medications   Prior to Admission medications    Medication Sig Start Date End Date Taking? Authorizing Provider  allopurinol (ZYLOPRIM) 300 MG tablet Take 1 tablet (300 mg total) by mouth daily. 07/01/13  Yes Vicie Mutters, PA-C  aspirin 81 MG tablet Take 81 mg by mouth daily.   Yes Historical Provider, MD  atenolol (TENORMIN) 25 MG tablet Take 25 mg by mouth daily.   Yes Historical Provider, MD  atorvastatin (LIPITOR) 40 MG tablet Take 1 tablet (40 mg total) by mouth at bedtime. 10/07/13  Yes Vicie Mutters, PA-C  Blood Glucose Monitoring Suppl (ONE TOUCH ULTRA SYSTEM KIT) W/DEVICE KIT 1 kit by Does not apply route once. test strips and lancets  (#100) - check glucose qd - Dx 250.00 / Magnus Sinning MD / NPI [id number] / 06/28/13 06/28/14 Yes Unk Pinto, MD  Cholecalciferol (VITAMIN D-3) 5000 UNITS TABS Take 5,000 Units by mouth daily.    Yes Historical Provider, MD  CRANBERRY PO Take by mouth.   Yes Historical Provider, MD  doxazosin (CARDURA) 8 MG tablet Take 4 mg by mouth daily.   Yes Historical Provider, MD  enalapril (VASOTEC) 20 MG tablet Take 10 mg by mouth 2 (two) times daily. 05/10/13 05/10/14 Yes Unk Pinto, MD  finasteride (PROSCAR) 5 MG tablet Take 5 mg by mouth daily.  Yes Historical Provider, MD  Garlic 4680 MG CAPS Take 1,000 mg by mouth daily.   Yes Historical Provider, MD  Magnesium 250 MG TABS Take 250-500 mg by mouth 3 (three) times daily.    Yes Historical Provider, MD  Omega-3 Fatty Acids (FISH OIL PO) Take by mouth.   Yes Historical Provider, MD  PRESCRIPTION MEDICATION Take 0.5 tablets by mouth daily.   Yes Historical Provider, MD  sulfamethoxazole-trimethoprim (BACTRIM DS) 800-160 MG per tablet Take 1 tablet by mouth 3 (three) times daily. 10/14/13   Jasper Riling. Arlynn Stare, MD   BP 162/76  Pulse 75  Temp(Src) 97.5 F (36.4 C) (Oral)  Resp 18  SpO2 97% Physical Exam  Constitutional: He appears well-developed and well-nourished.  Musculoskeletal: He exhibits tenderness.  Approximately 1 cm darker tender center area  without fluctuance on right thigh. It is from his some slightly redder erythema to around 3 cm and then a larger lighter erythema to about 5 cm he was demarcated with circles by ink pen.   Skin: There is erythema.    ED Course  Procedures (including critical care time) Labs Review Labs Reviewed - No data to display  Imaging Review No results found.   EKG Interpretation None      MDM   Final diagnoses:  Cellulitis    Patient with cellulitis versus potential spider bite on right thigh. Does not appear to be an abscess. The edges were demarcated. He'll be given antibiotics and will followup in 2 days if it is not improving.    Jasper Riling. Alvino Chapel, MD 10/14/13 1626

## 2013-10-21 ENCOUNTER — Other Ambulatory Visit: Payer: Self-pay

## 2013-10-21 MED ORDER — ENALAPRIL MALEATE 20 MG PO TABS: 20.0000 mg | ORAL_TABLET | Freq: Every day | ORAL | Status: DC

## 2013-11-11 ENCOUNTER — Ambulatory Visit: Payer: Self-pay | Admitting: Internal Medicine

## 2013-11-13 ENCOUNTER — Ambulatory Visit: Payer: Self-pay | Admitting: Internal Medicine

## 2013-11-15 ENCOUNTER — Ambulatory Visit: Payer: Self-pay | Admitting: Internal Medicine

## 2013-11-15 ENCOUNTER — Ambulatory Visit (INDEPENDENT_AMBULATORY_CARE_PROVIDER_SITE_OTHER): Payer: Medicare Other | Admitting: Internal Medicine

## 2013-11-15 ENCOUNTER — Encounter: Payer: Self-pay | Admitting: Internal Medicine

## 2013-11-15 VITALS — BP 132/64 | HR 48 | Temp 98.1°F | Resp 16 | Ht 75.5 in | Wt 213.2 lb

## 2013-11-15 DIAGNOSIS — E782 Mixed hyperlipidemia: Secondary | ICD-10-CM

## 2013-11-15 DIAGNOSIS — Z0000 Encounter for general adult medical examination without abnormal findings: Secondary | ICD-10-CM | POA: Insufficient documentation

## 2013-11-15 DIAGNOSIS — W57XXXA Bitten or stung by nonvenomous insect and other nonvenomous arthropods, initial encounter: Secondary | ICD-10-CM

## 2013-11-15 DIAGNOSIS — S90861A Insect bite (nonvenomous), right foot, initial encounter: Secondary | ICD-10-CM

## 2013-11-15 DIAGNOSIS — Z79899 Other long term (current) drug therapy: Secondary | ICD-10-CM

## 2013-11-15 DIAGNOSIS — R7309 Other abnormal glucose: Secondary | ICD-10-CM

## 2013-11-15 DIAGNOSIS — E559 Vitamin D deficiency, unspecified: Secondary | ICD-10-CM

## 2013-11-15 DIAGNOSIS — E790 Hyperuricemia without signs of inflammatory arthritis and tophaceous disease: Secondary | ICD-10-CM

## 2013-11-15 DIAGNOSIS — I10 Essential (primary) hypertension: Secondary | ICD-10-CM

## 2013-11-15 DIAGNOSIS — R7989 Other specified abnormal findings of blood chemistry: Secondary | ICD-10-CM

## 2013-11-15 DIAGNOSIS — IMO0002 Reserved for concepts with insufficient information to code with codable children: Secondary | ICD-10-CM

## 2013-11-15 LAB — BASIC METABOLIC PANEL WITH GFR
BUN: 16 mg/dL (ref 6–23)
CO2: 25 mEq/L (ref 19–32)
Calcium: 9.7 mg/dL (ref 8.4–10.5)
Chloride: 101 mEq/L (ref 96–112)
Creat: 0.75 mg/dL (ref 0.50–1.35)
Glucose, Bld: 89 mg/dL (ref 70–99)
POTASSIUM: 4 meq/L (ref 3.5–5.3)
SODIUM: 141 meq/L (ref 135–145)

## 2013-11-15 LAB — MAGNESIUM: Magnesium: 2.1 mg/dL (ref 1.5–2.5)

## 2013-11-15 LAB — CBC WITH DIFFERENTIAL/PLATELET
BASOS PCT: 0 % (ref 0–1)
Basophils Absolute: 0 10*3/uL (ref 0.0–0.1)
Eosinophils Absolute: 0.1 10*3/uL (ref 0.0–0.7)
Eosinophils Relative: 1 % (ref 0–5)
HCT: 39.8 % (ref 39.0–52.0)
HEMOGLOBIN: 14 g/dL (ref 13.0–17.0)
LYMPHS PCT: 22 % (ref 12–46)
Lymphs Abs: 1.7 10*3/uL (ref 0.7–4.0)
MCH: 32.4 pg (ref 26.0–34.0)
MCHC: 35.2 g/dL (ref 30.0–36.0)
MCV: 92.1 fL (ref 78.0–100.0)
MONOS PCT: 8 % (ref 3–12)
Monocytes Absolute: 0.6 10*3/uL (ref 0.1–1.0)
NEUTROS ABS: 5.2 10*3/uL (ref 1.7–7.7)
Neutrophils Relative %: 69 % (ref 43–77)
Platelets: 186 10*3/uL (ref 150–400)
RBC: 4.32 MIL/uL (ref 4.22–5.81)
RDW: 13.6 % (ref 11.5–15.5)
WBC: 7.6 10*3/uL (ref 4.0–10.5)

## 2013-11-15 LAB — HEPATIC FUNCTION PANEL
ALT: 24 U/L (ref 0–53)
AST: 29 U/L (ref 0–37)
Albumin: 4.5 g/dL (ref 3.5–5.2)
Alkaline Phosphatase: 65 U/L (ref 39–117)
BILIRUBIN INDIRECT: 0.6 mg/dL (ref 0.2–1.2)
Bilirubin, Direct: 0.2 mg/dL (ref 0.0–0.3)
Total Bilirubin: 0.8 mg/dL (ref 0.2–1.2)
Total Protein: 6.6 g/dL (ref 6.0–8.3)

## 2013-11-15 LAB — URIC ACID: URIC ACID, SERUM: 6 mg/dL (ref 4.0–7.8)

## 2013-11-15 LAB — TSH: TSH: 0.704 u[IU]/mL (ref 0.350–4.500)

## 2013-11-15 NOTE — Progress Notes (Signed)
Patient ID: Michael Waller, male   DOB: Jun 03, 1940, 73 y.o.   MRN: [id number]   This very nice 73 y.o.[name] presents for 3 month follow up with Hypertension, Hyperlipidemia, Pre-Diabetes and Vitamin D Deficiency. Patient also has concerns about recent  tick bites on his Rt hip & ankle areas.   HTN predates since 86. BP has been controlled at home. Today's BP: 132/64 mmHg. Patient denies any cardiac type chest pain, palpitations, dyspnea/orthopnea/PND, dizziness, claudication, or dependent edema.   Hyperlipidemia is controlled with diet & meds. Patient denies myalgias or other med SE's. Last Lipids were at goal. Lab Results  Component Value Date   CHOL 159 08/21/2013   HDL 42 08/21/2013   LDLCALC 96 08/21/2013   TRIG 106 08/21/2013   CHOLHDL 3.8 08/21/2013  .  Also, the patient has history of PreDiabetes/insulin resistance since 2010 with A1c 6.1%   and last A1c was     Lab Results  Component Value Date   HGBA1C 5.7* 08/21/2013   Patient denies any symptoms of reactive hypoglycemia, diabetic polys, paresthesias or visual blurring.   Further, Patient has history of Vitamin D Deficiency of 49 on supplements in 2008 and last vitamin D was 90 in Dec 2014. Patient supplements vitamin D without any suspected side-effects.   Medication List   allopurinol 300 MG tablet  Commonly known as:  ZYLOPRIM  Take 1 tablet (300 mg total) by mouth daily.     aspirin 81 MG tablet  Take 81 mg by mouth daily.     atenolol 25 MG tablet  Commonly known as:  TENORMIN  Take 25 mg by mouth daily.     atorvastatin 40 MG tablet  Commonly known as:  LIPITOR  Take 1 tablet (40 mg total) by mouth at bedtime.     CRANBERRY PO  Take by mouth.     doxazosin 8 MG tablet  Commonly known as:  CARDURA  Take 4 mg by mouth daily.     enalapril 20 MG tablet  Commonly known as:  VASOTEC  Take 1 tablet (20 mg total) by mouth daily.     finasteride 5 MG tablet  Commonly known as:  PROSCAR  Take 5 mg by mouth daily.     FISH OIL PO  Take by mouth.     Garlic 6948 MG Caps  Take 1,000 mg by mouth daily.     Magnesium 250 MG Tabs  Take 250-500 mg by mouth 3 (three) times daily.     ONE TOUCH ULTRA SYSTEM KIT W/DEVICE Kit  1 kit by Does not apply route once. test strips and lancets  (#100) - check glucose qd - Dx 250.00 / Magnus Sinning MD / NPI [id number] /     PRESCRIPTION MEDICATION  Take 0.5 tablets by mouth daily.     Vitamin D-3 5000 UNITS Tabs  Take 5,000 Units by mouth daily.      No Known Allergies  PMHx:   Past Medical History  Diagnosis Date  . Hypertension   . Hyperlipidemia   . Pre-diabetes   . Vitamin D deficiency   . Kidney stones   . GERD (gastroesophageal reflux disease)   . Nephrolithiasis   . Obstructive uropathy   . BPH with elevated PSA hx  . Allergy    FHx:    Reviewed / unchanged  SHx:    Reviewed / unchanged  Systems Review:  Constitutional: Denies fever, chills, wt changes, headaches, insomnia, fatigue, night sweats, change in  appetite. Eyes: Denies redness, blurred vision, diplopia, discharge, itchy, watery eyes.  ENT: Denies discharge, congestion, post nasal drip, epistaxis, sore throat, earache, hearing loss, dental pain, tinnitus, vertigo, sinus pain, snoring.  CV: Denies chest pain, palpitations, irregular heartbeat, syncope, dyspnea, diaphoresis, orthopnea, PND, claudication or edema. Respiratory: denies cough, dyspnea, DOE, pleurisy, hoarseness, laryngitis, wheezing.  Gastrointestinal: Denies dysphagia, odynophagia, heartburn, reflux, water brash, abdominal pain or cramps, nausea, vomiting, bloating, diarrhea, constipation, hematemesis, melena, hematochezia  or hemorrhoids. Genitourinary: Denies dysuria, frequency, urgency, nocturia, hesitancy, discharge, hematuria or flank pain. Musculoskeletal: Denies arthralgias, myalgias, stiffness, jt. swelling, pain, limping or strain/sprain.  Skin: Denies pruritus, rash, hives, warts, acne, eczema or change in skin  lesion(s). Neuro: No weakness, tremor, incoordination, spasms, paresthesia or pain. Psychiatric: Denies confusion, memory loss or sensory loss. Endo: Denies change in weight, skin or hair change.  Heme/Lymph: No excessive bleeding, bruising or enlarged lymph nodes.  Exam:  BP 132/64  P 48  T 98.1 F  Resp 16  Ht 6' 3.5"   Wt 213 lb 3.2 oz   BMI 26.29 kg/m2  Appears well nourished and in no distress. Eyes: PERRLA, EOMs, conjunctiva no swelling or erythema. Sinuses: No frontal/maxillary tenderness ENT/Mouth: EAC's clear, TM's nl w/o erythema, bulging. Nares clear w/o erythema, swelling, exudates. Oropharynx clear without erythema or exudates. Oral hygiene is good. Tongue normal, non obstructing. Hearing intact.  Neck: Supple. Thyroid nl. Car 2+/2+ without bruits, nodes or JVD. Chest: Respirations nl with BS clear & equal w/o rales, rhonchi, wheezing or stridor.  Cor: Heart sounds normal w/ regular rate and rhythm without sig. murmurs, gallops, clicks, or rubs. Peripheral pulses normal and equal  without edema.  Abdomen: Soft & bowel sounds normal. Non-tender w/o guarding, rebound, hernias, masses, or organomegaly.  Lymphatics: Unremarkable.  Musculoskeletal: Full ROM all peripheral extremities, joint stability, 5/5 strength, and normal gait.  Skin: Warm, dry without exposed rashes, lesions or ecchymosis apparent. Areas of alleged tick bite show 3-4 mm areas of erythematous firm thickening in the w/central eschar. Neuro: Cranial nerves intact, reflexes equal bilaterally. Sensory-motor testing grossly intact. Tendon reflexes grossly intact.  Pysch: Alert & oriented x 3. Insight and judgement nl & appropriate. No ideations.  Assessment and Plan:  1. Hypertension - Continue monitor blood pressure at home. Continue diet/meds same.  2. Hyperlipidemia - Continue diet/meds, exercise,& lifestyle modifications. Continue monitor periodic cholesterol/liver & renal functions   3. Pre-Diabetes -  Continue diet, exercise, lifestyle modifications. Monitor appropriate labs.  4. Vitamin D Deficiency - Continue supplementation.  5. Tick Bites, r/o Lyme's disease  Recommended regular exercise, BP monitoring, weight control, and discussed med and SE's. Recommended labs to assess and monitor clinical status. Further disposition pending results of labs.

## 2013-11-15 NOTE — Patient Instructions (Signed)

## 2013-11-16 LAB — VITAMIN D 25 HYDROXY (VIT D DEFICIENCY, FRACTURES): VIT D 25 HYDROXY: 97 ng/mL — AB (ref 30–89)

## 2013-11-16 LAB — INSULIN, FASTING: INSULIN FASTING, SERUM: 8 u[IU]/mL (ref 3–28)

## 2013-11-19 LAB — LYME ABY, WSTRN BLT IGG & IGM W/BANDS
B burgdorferi IgG Abs (IB): NEGATIVE
B burgdorferi IgM Abs (IB): NEGATIVE
LYME DISEASE 39 KD IGG: NONREACTIVE
LYME DISEASE 45 KD IGG: NONREACTIVE
LYME DISEASE 58 KD IGG: NONREACTIVE
Lyme Disease 18 kD IgG: NONREACTIVE
Lyme Disease 23 kD IgG: NONREACTIVE
Lyme Disease 23 kD IgM: NONREACTIVE
Lyme Disease 28 kD IgG: NONREACTIVE
Lyme Disease 30 kD IgG: NONREACTIVE
Lyme Disease 39 kD IgM: NONREACTIVE
Lyme Disease 41 kD IgG: NONREACTIVE
Lyme Disease 41 kD IgM: NONREACTIVE
Lyme Disease 66 kD IgG: NONREACTIVE
Lyme Disease 93 kD IgG: NONREACTIVE

## 2013-11-20 ENCOUNTER — Ambulatory Visit: Payer: Self-pay | Admitting: Internal Medicine

## 2013-12-31 ENCOUNTER — Encounter: Payer: Self-pay | Admitting: Physician Assistant

## 2013-12-31 ENCOUNTER — Ambulatory Visit (INDEPENDENT_AMBULATORY_CARE_PROVIDER_SITE_OTHER): Payer: Medicare Other | Admitting: Physician Assistant

## 2013-12-31 VITALS — BP 130/72 | HR 76 | Temp 100.6°F | Resp 16 | Ht 75.5 in | Wt 213.0 lb

## 2013-12-31 DIAGNOSIS — R509 Fever, unspecified: Secondary | ICD-10-CM

## 2013-12-31 DIAGNOSIS — M791 Myalgia, unspecified site: Secondary | ICD-10-CM

## 2013-12-31 DIAGNOSIS — N390 Urinary tract infection, site not specified: Secondary | ICD-10-CM

## 2013-12-31 DIAGNOSIS — IMO0001 Reserved for inherently not codable concepts without codable children: Secondary | ICD-10-CM

## 2013-12-31 LAB — BASIC METABOLIC PANEL WITH GFR
BUN: 11 mg/dL (ref 6–23)
CHLORIDE: 101 meq/L (ref 96–112)
CO2: 28 mEq/L (ref 19–32)
Calcium: 9 mg/dL (ref 8.4–10.5)
Creat: 0.75 mg/dL (ref 0.50–1.35)
GFR, Est African American: >89 mL/min
GFR, Est Non African American: >89 mL/min
Glucose, Bld: 94 mg/dL (ref 70–99)
Potassium: 4.3 mEq/L (ref 3.5–5.3)
SODIUM: 137 meq/L (ref 135–145)

## 2013-12-31 LAB — CBC WITH DIFFERENTIAL/PLATELET
Basophils Absolute: 0 10*3/uL (ref 0.0–0.1)
Basophils Relative: 0 % (ref 0–1)
Eosinophils Absolute: 0.1 10*3/uL (ref 0.0–0.7)
Eosinophils Relative: 2 % (ref 0–5)
HCT: 39.5 % (ref 39.0–52.0)
Hemoglobin: 14 g/dL (ref 13.0–17.0)
LYMPHS ABS: 0.9 10*3/uL (ref 0.7–4.0)
LYMPHS PCT: 12 % (ref 12–46)
MCH: 32 pg (ref 26.0–34.0)
MCHC: 35.4 g/dL (ref 30.0–36.0)
MCV: 90.2 fL (ref 78.0–100.0)
Monocytes Absolute: 1 10*3/uL (ref 0.1–1.0)
Monocytes Relative: 14 % — ABNORMAL HIGH (ref 3–12)
Neutro Abs: 5.2 10*3/uL (ref 1.7–7.7)
Neutrophils Relative %: 72 % (ref 43–77)
PLATELETS: 200 10*3/uL (ref 150–400)
RBC: 4.38 MIL/uL (ref 4.22–5.81)
RDW: 13.5 % (ref 11.5–15.5)
WBC: 7.2 10*3/uL (ref 4.0–10.5)

## 2013-12-31 LAB — HEPATIC FUNCTION PANEL
ALK PHOS: 73 U/L (ref 39–117)
ALT: 32 U/L (ref 0–53)
AST: 29 U/L (ref 0–37)
Albumin: 3.9 g/dL (ref 3.5–5.2)
BILIRUBIN INDIRECT: 0.7 mg/dL (ref 0.2–1.2)
Bilirubin, Direct: 0.2 mg/dL (ref 0.0–0.3)
Total Bilirubin: 0.9 mg/dL (ref 0.2–1.2)
Total Protein: 6.2 g/dL (ref 6.0–8.3)

## 2013-12-31 LAB — CK: CK TOTAL: 90 U/L (ref 7–232)

## 2013-12-31 NOTE — Patient Instructions (Signed)
Continue the claritin and tylenol.  Depending on your labs we may give you an antibiotic  Fever, Adult A fever is a higher than normal body temperature. In an adult, an oral temperature around 98.6 F (37 C) is considered normal. A temperature of 100.4 F (38 C) or higher is generally considered a fever. Mild or moderate fevers generally have no long-term effects and often do not require treatment. Extreme fever (greater than or equal to 106 F or 41.1 C) can cause seizures. The sweating that may occur with repeated or prolonged fever may cause dehydration. Elderly people can develop confusion during a fever. A measured temperature can vary with:  Age.  Time of day.  Method of measurement (mouth, underarm, rectal, or ear). The fever is confirmed by taking a temperature with a thermometer. Temperatures can be taken different ways. Some methods are accurate and some are not.  An oral temperature is used most commonly. Electronic thermometers are fast and accurate.  An ear temperature will only be accurate if the thermometer is positioned as recommended by the manufacturer.  A rectal temperature is accurate and done for those adults who have a condition where an oral temperature cannot be taken.  An underarm (axillary) temperature is not accurate and not recommended. Fever is a symptom, not a disease.  CAUSES   Infections commonly cause fever.  Some noninfectious causes for fever include:  Some arthritis conditions.  Some thyroid or adrenal gland conditions.  Some immune system conditions.  Some types of cancer.  A medicine reaction.  High doses of certain street drugs such as methamphetamine.  Dehydration.  Exposure to high outside or room temperatures.  Occasionally, the source of a fever cannot be determined. This is sometimes called a "fever of unknown origin" (FUO).  Some situations may lead to a temporary rise in body temperature that may go away on its own.  Examples are:  Childbirth.  Surgery.  Intense exercise. HOME CARE INSTRUCTIONS   Take appropriate medicines for fever. Follow dosing instructions carefully. If you use acetaminophen to reduce the fever, be careful to avoid taking other medicines that also contain acetaminophen. Do not take aspirin for a fever if you are younger than age 36. There is an association with Reye's syndrome. Reye's syndrome is a rare but potentially deadly disease.  If an infection is present and antibiotics have been prescribed, take them as directed. Finish them even if you start to feel better.  Rest as needed.  Maintain an adequate fluid intake. To prevent dehydration during an illness with prolonged or recurrent fever, you may need to drink extra fluid.Drink enough fluids to keep your urine clear or pale yellow.  Sponging or bathing with room temperature water may help reduce body temperature. Do not use ice water or alcohol sponge baths.  Dress comfortably, but do not over-bundle. SEEK MEDICAL CARE IF:   You are unable to keep fluids down.  You develop vomiting or diarrhea.  You are not feeling at least partly better after 3 days.  You develop new symptoms or problems. SEEK IMMEDIATE MEDICAL CARE IF:   You have shortness of breath or trouble breathing.  You develop excessive weakness.  You are dizzy or you faint.  You are extremely thirsty or you are making little or no urine.  You develop new pain that was not there before (such as in the head, neck, chest, back, or abdomen).  You have persistent vomiting and diarrhea for more than 1 to 2 days.  You develop a stiff neck or your eyes become sensitive to light.  You develop a skin rash.  You have a fever or persistent symptoms for more than 2 to 3 days.  You have a fever and your symptoms suddenly get worse. MAKE SURE YOU:   Understand these instructions.  Will watch your condition.  Will get help right away if you are not  doing well or get worse. Document Released: 11/02/2000 Document Revised: 09/23/2013 Document Reviewed: 03/10/2011 [facility] Patient Information 2015 [place], [place]. This information is not intended to replace advice given to you by your health care provider. Make sure you discuss any questions you have with your health care provider.

## 2013-12-31 NOTE — Progress Notes (Signed)
   Subjective:    Patient ID: Michael Waller, male    DOB: [birth year], 73 y.o.   MRN: [id number]  Fever  Associated symptoms include headaches, muscle aches and urinary pain (passed kidney stone memorial day weekend, sees Dr. Jeffie Pollock). Pertinent negatives include no abdominal pain, chest pain, congestion, coughing, diarrhea, ear pain, nausea, rash, sleepiness, sore throat, vomiting or wheezing. He has tried acetaminophen and NSAIDs for the symptoms. The treatment provided mild relief.   73 y.o. male with history of HTN, chol presents with fever or body aches. Last Thursday he started to feel warm, took temperature by mouth it was 100, he has been taking tylenol which has been controlling the fever which has been fluctuating. He has had extreme fatigue, worse with exertion/working. Has headache, bilateral shoulder pain. He has a history of tick bite in May, negative RMSF and lyme then. Feve  Wt Readings from Last 3 Encounters:  12/31/13 213 lb (96.616 kg)  11/15/13 213 lb 3.2 oz (96.707 kg)  08/21/13 216 lb (97.977 kg)    Review of Systems  Constitutional: Positive for fever, chills and fatigue. Negative for diaphoresis, activity change, appetite change and unexpected weight change.  HENT: Positive for rhinorrhea and sinus pressure. Negative for congestion, dental problem, drooling, ear discharge, ear pain, facial swelling, hearing loss, mouth sores, nosebleeds, postnasal drip, sneezing, sore throat, tinnitus, trouble swallowing and voice change.   Eyes: Negative.   Respiratory: Negative.  Negative for cough and wheezing.   Cardiovascular: Negative for chest pain, palpitations and leg swelling.  Gastrointestinal: Negative.  Negative for nausea, vomiting, abdominal pain and diarrhea.  Endocrine: Negative.   Genitourinary: Positive for dysuria and frequency.  Musculoskeletal: Positive for myalgias. Negative for arthralgias, back pain, gait problem, joint swelling, neck pain and neck stiffness.   Skin: Negative.  Negative for rash.  Neurological: Positive for headaches. Negative for dizziness, tremors, seizures, syncope, facial asymmetry, speech difficulty, weakness, light-headedness and numbness.       Objective:   Physical Exam  Constitutional: He is oriented to person, place, and time. He appears well-developed and well-nourished.  HENT:  Head: Normocephalic and atraumatic.  Right Ear: External ear normal.  Left Ear: External ear normal.  Nose: Nose normal.  Mouth/Throat: Oropharynx is clear and moist.  Eyes: Conjunctivae are normal. Pupils are equal, round, and reactive to light.  Neck: Neck supple.  Cardiovascular: Normal rate and regular rhythm.   Pulmonary/Chest: Effort normal and breath sounds normal.  Abdominal: Soft. Bowel sounds are normal.  Musculoskeletal: Normal range of motion.  Lymphadenopathy:    He has no cervical adenopathy.  Neurological: He is alert and oriented to person, place, and time.  Skin: Skin is dry. No rash noted.        Assessment & Plan:  Fever, myalgias- non specific exam- ? Viral versus bacterial, rule out leukemia/muscle DO continue tylenol, increase fluids, claritin, pending labs we will send in ABX, declines doxy and prednisone. If any CP, SOB, worsening weakness go to the ER

## 2014-01-01 ENCOUNTER — Other Ambulatory Visit: Payer: Self-pay | Admitting: Physician Assistant

## 2014-01-01 LAB — URINALYSIS, ROUTINE W REFLEX MICROSCOPIC
BILIRUBIN URINE: NEGATIVE
GLUCOSE, UA: 250 mg/dL — AB
HGB URINE DIPSTICK: NEGATIVE
Ketones, ur: NEGATIVE mg/dL
Leukocytes, UA: NEGATIVE
Nitrite: NEGATIVE
Protein, ur: NEGATIVE mg/dL
Specific Gravity, Urine: 1.009 (ref 1.005–1.030)
Urobilinogen, UA: 1 mg/dL (ref 0.0–1.0)
pH: 7 (ref 5.0–8.0)

## 2014-01-01 LAB — ROCKY MTN SPOTTED FVR ABS PNL(IGG+IGM)
RMSF IGM: 0.09 IV
RMSF IgG: 0.12 IV

## 2014-01-01 LAB — URINE CULTURE
Colony Count: NO GROWTH
Organism ID, Bacteria: NO GROWTH

## 2014-01-01 MED ORDER — PREDNISONE 20 MG PO TABS: ORAL_TABLET | ORAL | Status: DC

## 2014-01-02 LAB — LYME ABY, WSTRN BLT IGG & IGM W/BANDS

## 2014-01-02 LAB — ALDOLASE: Aldolase: 5.3 U/L (ref <=8.1)

## 2014-01-03 ENCOUNTER — Telehealth: Payer: Self-pay | Admitting: Physician Assistant

## 2014-01-03 MED ORDER — AZITHROMYCIN 250 MG PO TABS: ORAL_TABLET | ORAL | Status: AC

## 2014-01-03 NOTE — Telephone Encounter (Signed)
Patient called the office. He is feeling better, no more myalgias, but still has a temperature, flushing, and night sweats, it is better with tyelnol. He has take 4 prednisone so far. Will send in ABX, made appointment for next week if he is not feeling better will come into office for additional labs/ TB skin test/CXR. Go to the ER if any CP, SOB, nausea, dizziness, severe HA, changes vision/speech

## 2014-01-06 ENCOUNTER — Ambulatory Visit: Payer: Self-pay | Admitting: Physician Assistant

## 2014-02-12 ENCOUNTER — Encounter: Payer: Self-pay | Admitting: Internal Medicine

## 2014-03-03 ENCOUNTER — Other Ambulatory Visit: Payer: Self-pay

## 2014-03-03 MED ORDER — ATORVASTATIN CALCIUM 40 MG PO TABS: 40.0000 mg | ORAL_TABLET | Freq: Every day | ORAL | Status: DC

## 2014-03-07 ENCOUNTER — Other Ambulatory Visit: Payer: Self-pay

## 2014-03-14 ENCOUNTER — Encounter: Payer: Self-pay | Admitting: Internal Medicine

## 2014-03-14 ENCOUNTER — Ambulatory Visit (INDEPENDENT_AMBULATORY_CARE_PROVIDER_SITE_OTHER): Payer: Medicare Other | Admitting: Internal Medicine

## 2014-03-14 VITALS — BP 118/70 | HR 52 | Temp 97.5°F | Resp 16 | Ht 75.0 in | Wt 211.4 lb

## 2014-03-14 DIAGNOSIS — Z0001 Encounter for general adult medical examination with abnormal findings: Secondary | ICD-10-CM

## 2014-03-14 DIAGNOSIS — E782 Mixed hyperlipidemia: Secondary | ICD-10-CM

## 2014-03-14 DIAGNOSIS — N400 Enlarged prostate without lower urinary tract symptoms: Secondary | ICD-10-CM

## 2014-03-14 DIAGNOSIS — Z1212 Encounter for screening for malignant neoplasm of rectum: Secondary | ICD-10-CM

## 2014-03-14 DIAGNOSIS — Z125 Encounter for screening for malignant neoplasm of prostate: Secondary | ICD-10-CM

## 2014-03-14 DIAGNOSIS — R7303 Prediabetes: Secondary | ICD-10-CM

## 2014-03-14 DIAGNOSIS — E790 Hyperuricemia without signs of inflammatory arthritis and tophaceous disease: Secondary | ICD-10-CM

## 2014-03-14 DIAGNOSIS — R6889 Other general symptoms and signs: Secondary | ICD-10-CM

## 2014-03-14 DIAGNOSIS — Z79899 Other long term (current) drug therapy: Secondary | ICD-10-CM

## 2014-03-14 DIAGNOSIS — E559 Vitamin D deficiency, unspecified: Secondary | ICD-10-CM

## 2014-03-14 DIAGNOSIS — Z1331 Encounter for screening for depression: Secondary | ICD-10-CM

## 2014-03-14 DIAGNOSIS — Z9181 History of falling: Secondary | ICD-10-CM

## 2014-03-14 DIAGNOSIS — I10 Essential (primary) hypertension: Secondary | ICD-10-CM

## 2014-03-14 LAB — CBC WITH DIFFERENTIAL/PLATELET
Basophils Absolute: 0.1 K/uL (ref 0.0–0.1)
Basophils Relative: 1 % (ref 0–1)
Eosinophils Absolute: 0.1 K/uL (ref 0.0–0.7)
Eosinophils Relative: 2 % (ref 0–5)
HCT: 41.7 % (ref 39.0–52.0)
Hemoglobin: 14.3 g/dL (ref 13.0–17.0)
Lymphocytes Relative: 25 % (ref 12–46)
Lymphs Abs: 1.7 K/uL (ref 0.7–4.0)
MCH: 31.3 pg (ref 26.0–34.0)
MCHC: 34.3 g/dL (ref 30.0–36.0)
MCV: 91.2 fL (ref 78.0–100.0)
Monocytes Absolute: 0.6 K/uL (ref 0.1–1.0)
Monocytes Relative: 9 % (ref 3–12)
Neutro Abs: 4.2 K/uL (ref 1.7–7.7)
Neutrophils Relative %: 63 % (ref 43–77)
Platelets: 201 K/uL (ref 150–400)
RBC: 4.57 MIL/uL (ref 4.22–5.81)
RDW: 14 % (ref 11.5–15.5)
WBC: 6.6 K/uL (ref 4.0–10.5)

## 2014-03-14 LAB — HEMOGLOBIN A1C
HEMOGLOBIN A1C: 5.7 % — AB (ref <5.7)
MEAN PLASMA GLUCOSE: 117 mg/dL — AB (ref <117)

## 2014-03-14 NOTE — Progress Notes (Signed)
Patient ID: Michael Waller, male   DOB: 26-May-1940, 73 y.o.   MRN: [id number]  Annual Screening Comprehensive Examination  This very nice 73 y.o.[name]  presents for complete physical.  Patient has been followed for HTN,  Prediabetes, Hyperlipidemia, and Vitamin D Deficiency.   HTN predates since 110. Patient's BP has been controlled at home ranging about 120/80.Today's BP: 118/70 mmHg. Patient denies any cardiac symptoms as chest pain, palpitations, shortness of breath, dizziness or ankle swelling.   Patient's hyperlipidemia is controlled with diet and medications. Patient denies myalgias or other medication SE's. Last lipids wereTotal Chol  159; HDL  42; LDL 96; Trigl106 on  08/21/2013.   Patient has  T2_NIDDM since 2006 and had done extremely well with dietary management with FBG's ranging 88-13 mg% and keeping A1c's less than 5.7% over the last year. Patient denies reactive hypoglycemic symptoms, visual blurring, diabetic polys or paresthesias. Last A1c was 5.7% on 08/21/2013.     Finally, patient has history of Vitamin D Deficiency of 49 (on supplements)  in 2008 and last vitamin D was 97 on 11/15/2013.   Medication Sig  . allopurinol  300 MG tablet Take 1 tablet (300 mg total) by mouth daily.  Marland Kitchen aspirin 81 MG tablet Take 81 mg by mouth daily.  Marland Kitchen atenolol (TENORMIN) 25 MG tablet Take 25 mg by mouth daily.  Marland Kitchen atorvastatin (LIPITOR) 40 MG tablet Take 1 tablet (40 mg total) by mouth at bedtime.  . ONE TOUCH ULTRA  1 kit  test strips and lancets check glucose qd - Dx 250.00   . VITAMIN D 5000 UNITS TABS Take 5,000 Units by mouth daily.   Marland Kitchen CRANBERRY PO Take daily.  Marland Kitchen doxazosin  8 MG tablet Take 4 mg by mouth daily.  . enalapril  20 MG tablet Take 1 tablet (20 mg total) by mouth daily.  . finasteride  5 MG  Take 5 mg by mouth daily.  . Garlic 0998 MG CAPS Take 1,000 mg by mouth daily.  . Magnesium 250 MG TABS Take 250-500 mg by mouth 3 (three) times daily.   . Omega-3 Fatty Acids (FISH OIL PO)  Take by mouth.   No Known Allergies  Past Medical History  Diagnosis Date  . Hypertension   . Hyperlipidemia   . Pre-diabetes   . Vitamin D deficiency   . Kidney stones   . GERD (gastroesophageal reflux disease)   . Nephrolithiasis   . Obstructive uropathy   . BPH with elevated PSA hx  . Allergy    Health Maintenance  Topic Date Due  . Influenza Vaccine  12/21/2013  . Zostavax  06/08/2016 (Originally 11/04/2000)  . Pneumococcal Polysaccharide Vaccine Age 62 And Over  05/10/2017 (Originally 11/04/2005)  . Tetanus/tdap  05/03/2016  . Colonoscopy  03/14/2017   Immunization History  Administered Date(s) Administered  . DTaP 08/31/2005   Past Surgical History  Procedure Laterality Date  . Lithotripsy  [phone]  . Prostate biopsy  2000, 12/2004    negative   Family History  Problem Relation Age of Onset  . Heart disease Mother   . Diabetes Mother   . Diabetes Father   . Heart disease Father   . Heart attack Father   . Diabetes Sister   . Diabetes Brother   . Diabetes Brother   . Diabetes Brother   . Heart disease Brother   . Stroke Brother   . Cancer Sister     breast  . Cancer Sister  colon   History   Social History  . Marital Status: Married    Spouse Name: N/A    Number of Children: N/A  . Years of Education: N/A   Occupational History  . Ret 2007 after 45 yr with [employer])   Social History Main Topics  . Smoking status: Former Smoker    Quit date: 05/23/1965  . Smokeless tobacco: Not on file  . Alcohol Use: No  . Drug Use: No  . Sexual Activity: Not on file    ROS Constitutional: Denies fever, chills, weight loss/gain, headaches, insomnia, fatigue, night sweats or change in appetite. Eyes: Denies redness, blurred vision, diplopia, discharge, itchy or watery eyes.  ENT: Denies discharge, congestion, post nasal drip, epistaxis, sore throat, earache, hearing loss, dental pain, Tinnitus, Vertigo, Sinus pain or snoring.   Cardio: Denies chest pain, palpitations, irregular heartbeat, syncope, dyspnea, diaphoresis, orthopnea, PND, claudication or edema Respiratory: denies cough, dyspnea, DOE, pleurisy, hoarseness, laryngitis or wheezing.  Gastrointestinal: Denies dysphagia, heartburn, reflux, water brash, pain, cramps, nausea, vomiting, bloating, diarrhea, constipation, hematemesis, melena, hematochezia, jaundice or hemorrhoids Genitourinary: Denies dysuria, frequency, urgency, nocturia, hesitancy, discharge, hematuria or flank pain Musculoskeletal: Denies arthralgia, myalgia, stiffness, Jt. Swelling, pain, limp or strain/sprain. Denies Falls. Skin: Denies puritis, rash, hives, warts, acne, eczema or change in skin lesion Neuro: No weakness, tremor, incoordination, spasms, paresthesia or pain Psychiatric: Denies confusion, memory loss or sensory loss. Denies Depression. Endocrine: Denies change in weight, skin, hair change, nocturia, and paresthesia, diabetic polys, visual blurring or hyper / hypo glycemic episodes.  Heme/Lymph: No excessive bleeding, bruising or enlarged lymph nodes.  Physical Exam  BP 118/70  Pulse 52  Temp(Src) 97.5 F (36.4 C) (Temporal)  Resp 16  Ht 6' 3"  (1.905 m)  Wt 211 lb 6.4 oz (95.89 kg)  BMI 26.42 kg/m2  General Appearance: Well nourished, in no apparent distress. Eyes: PERRLA, EOMs, conjunctiva no swelling or erythema, normal fundi and vessels. Sinuses: No frontal/maxillary tenderness ENT/Mouth: EACs patent / TMs  nl. Nares clear without erythema, swelling, mucoid exudates. Oral hygiene is good. No erythema, swelling, or exudate. Tongue normal, non-obstructing. Tonsils not swollen or erythematous. Hearing normal.  Neck: Supple, thyroid normal. No bruits, nodes or JVD. Respiratory: Respiratory effort normal.  BS equal and clear bilateral without rales, rhonci, wheezing or stridor. Cardio: Heart sounds are normal with regular rate and rhythm and no murmurs, rubs or gallops.  Peripheral pulses are normal and equal bilaterally without edema. No aortic or femoral bruits. Chest: symmetric with normal excursions and percussion.  Abdomen: Flat, soft, with bowl sounds. Nontender, no guarding, rebound, hernias, masses, or organomegaly.  Lymphatics: Non tender without lymphadenopathy.  Genitourinary: No hernias.Testes nl. DRE - prostate nl for age - smooth & firm w/o nodules. Musculoskeletal: Full ROM all peripheral extremities, joint stability, 5/5 strength, and normal gait. Skin: Warm and dry without rashes, lesions, cyanosis, clubbing or  ecchymosis.  Neuro: Cranial nerves intact, reflexes equal bilaterally. Normal muscle tone, no cerebellar symptoms. Sensation intact.  Pysch: Awake and oriented X 3with normal affect, insight and judgment appropriate.   Assessment and Plan  1. Encounter for general adult medical examination with abnormal findings   2. Essential hypertension  - Microalbumin / creatinine urine ratio - EKG 12-Lead - [place], RETROPERITNL ABD,  LTD - TSH  3. Mixed hyperlipidemia  - Lipid panel  4. Prediabetes  - Hemoglobin A1c - Insulin, fasting  5. Vitamin D deficiency  - Vit D  25 hydroxy (rtn osteoporosis  monitoring)  6. Hyperuricemia   7. BPH (benign prostatic hypertrophy)   8. Screening for rectal cancer  - POC Hemoccult Bld/Stl (3-Cd Home Screen); Future  9. Screening for prostate cancer  - PSA  10. Medication management  - Urine Microscopic - CBC with Differential - BASIC METABOLIC PANEL WITH GFR - Hepatic function panel - Magnesium  11. At low risk for fall   12. Screening for depression  - Negative  +++++++++++++++++++++  Continue prudent diet as discussed, weight control, BP monitoring, regular exercise, and medications as discussed.  Discussed med effects and SE's. Routine screening labs and tests as requested with regular follow-up as recommended.

## 2014-03-14 NOTE — Patient Instructions (Signed)

## 2014-03-15 LAB — BASIC METABOLIC PANEL WITH GFR
BUN: 15 mg/dL (ref 6–23)
CALCIUM: 9.4 mg/dL (ref 8.4–10.5)
CO2: 30 meq/L (ref 19–32)
CREATININE: 0.76 mg/dL (ref 0.50–1.35)
Chloride: 102 mEq/L (ref 96–112)
GFR, Est African American: >89 mL/min
GFR, Est Non African American: >89 mL/min
Glucose, Bld: 86 mg/dL (ref 70–99)
Potassium: 4.3 mEq/L (ref 3.5–5.3)
Sodium: 140 mEq/L (ref 135–145)

## 2014-03-15 LAB — URINALYSIS, MICROSCOPIC ONLY
Bacteria, UA: NONE SEEN
Casts: NONE SEEN
Crystals: NONE SEEN
Squamous Epithelial / LPF: NONE SEEN

## 2014-03-15 LAB — HEPATIC FUNCTION PANEL
ALT: 24 U/L (ref 0–53)
AST: 29 U/L (ref 0–37)
Albumin: 4.3 g/dL (ref 3.5–5.2)
Alkaline Phosphatase: 57 U/L (ref 39–117)
Bilirubin, Direct: 0.2 mg/dL (ref 0.0–0.3)
Indirect Bilirubin: 0.7 mg/dL (ref 0.2–1.2)
Total Bilirubin: 0.9 mg/dL (ref 0.2–1.2)
Total Protein: 6 g/dL (ref 6.0–8.3)

## 2014-03-15 LAB — PSA: PSA: 1.36 ng/mL (ref <=4.00)

## 2014-03-15 LAB — LIPID PANEL
CHOLESTEROL: 145 mg/dL (ref 0–200)
HDL: 48 mg/dL (ref >39)
LDL Cholesterol: 84 mg/dL (ref 0–99)
TRIGLYCERIDES: 66 mg/dL (ref <150)
Total CHOL/HDL Ratio: 3 Ratio
VLDL: 13 mg/dL (ref 0–40)

## 2014-03-15 LAB — MICROALBUMIN / CREATININE URINE RATIO
CREATININE, URINE: 29.7 mg/dL
Microalb Creat Ratio: 6.7 mg/g (ref 0.0–30.0)
Microalb, Ur: 0.2 mg/dL (ref <2.0)

## 2014-03-15 LAB — VITAMIN D 25 HYDROXY (VIT D DEFICIENCY, FRACTURES): VIT D 25 HYDROXY: 94 ng/mL — AB (ref 30–89)

## 2014-03-15 LAB — INSULIN, FASTING: INSULIN FASTING, SERUM: 4.1 u[IU]/mL (ref 2.0–19.6)

## 2014-03-15 LAB — TSH: TSH: 0.996 u[IU]/mL (ref 0.350–4.500)

## 2014-03-15 LAB — MAGNESIUM: MAGNESIUM: 2 mg/dL (ref 1.5–2.5)

## 2014-03-17 ENCOUNTER — Ambulatory Visit: Payer: PPO | Admitting: *Deleted

## 2014-03-17 DIAGNOSIS — Z111 Encounter for screening for respiratory tuberculosis: Secondary | ICD-10-CM

## 2014-03-19 LAB — TB SKIN TEST
Induration: 0 mm
TB SKIN TEST: NEGATIVE

## 2014-04-09 ENCOUNTER — Other Ambulatory Visit (INDEPENDENT_AMBULATORY_CARE_PROVIDER_SITE_OTHER): Payer: Medicare Other | Admitting: *Deleted

## 2014-04-09 DIAGNOSIS — Z1212 Encounter for screening for malignant neoplasm of rectum: Secondary | ICD-10-CM

## 2014-04-09 LAB — POC HEMOCCULT BLD/STL (HOME/3-CARD/SCREEN)
Card #2 Fecal Occult Blod, POC: NEGATIVE
Card #3 Fecal Occult Blood, POC: NEGATIVE
Fecal Occult Blood, POC: NEGATIVE

## 2014-04-11 ENCOUNTER — Encounter: Payer: Self-pay | Admitting: *Deleted

## 2014-04-16 ENCOUNTER — Other Ambulatory Visit: Payer: Self-pay | Admitting: *Deleted

## 2014-04-16 MED ORDER — ATENOLOL 25 MG PO TABS: 25.0000 mg | ORAL_TABLET | Freq: Every day | ORAL | Status: DC

## 2014-07-10 ENCOUNTER — Other Ambulatory Visit: Payer: Self-pay | Admitting: Internal Medicine

## 2014-07-15 ENCOUNTER — Other Ambulatory Visit: Payer: Self-pay

## 2014-07-15 MED ORDER — ALLOPURINOL 300 MG PO TABS: 300.0000 mg | ORAL_TABLET | Freq: Every day | ORAL | Status: DC

## 2014-07-23 ENCOUNTER — Encounter: Payer: Self-pay | Admitting: Physician Assistant

## 2014-07-23 ENCOUNTER — Ambulatory Visit (INDEPENDENT_AMBULATORY_CARE_PROVIDER_SITE_OTHER): Payer: PPO | Admitting: Physician Assistant

## 2014-07-23 VITALS — BP 138/78 | HR 56 | Temp 97.7°F | Resp 16 | Ht 75.0 in | Wt 217.0 lb

## 2014-07-23 DIAGNOSIS — R7309 Other abnormal glucose: Secondary | ICD-10-CM

## 2014-07-23 DIAGNOSIS — E559 Vitamin D deficiency, unspecified: Secondary | ICD-10-CM

## 2014-07-23 DIAGNOSIS — E782 Mixed hyperlipidemia: Secondary | ICD-10-CM

## 2014-07-23 DIAGNOSIS — Z79899 Other long term (current) drug therapy: Secondary | ICD-10-CM

## 2014-07-23 DIAGNOSIS — R7303 Prediabetes: Secondary | ICD-10-CM

## 2014-07-23 DIAGNOSIS — E790 Hyperuricemia without signs of inflammatory arthritis and tophaceous disease: Secondary | ICD-10-CM

## 2014-07-23 DIAGNOSIS — I10 Essential (primary) hypertension: Secondary | ICD-10-CM

## 2014-07-23 LAB — CBC WITH DIFFERENTIAL/PLATELET
BASOS ABS: 0.1 10*3/uL (ref 0.0–0.1)
BASOS PCT: 1 % (ref 0–1)
EOS ABS: 0.1 10*3/uL (ref 0.0–0.7)
Eosinophils Relative: 2 % (ref 0–5)
HCT: 43 % (ref 39.0–52.0)
HEMOGLOBIN: 14.7 g/dL (ref 13.0–17.0)
LYMPHS PCT: 26 % (ref 12–46)
Lymphs Abs: 1.6 10*3/uL (ref 0.7–4.0)
MCH: 31.5 pg (ref 26.0–34.0)
MCHC: 34.2 g/dL (ref 30.0–36.0)
MCV: 92.3 fL (ref 78.0–100.0)
MPV: 9.6 fL (ref 8.6–12.4)
Monocytes Absolute: 0.4 10*3/uL (ref 0.1–1.0)
Monocytes Relative: 7 % (ref 3–12)
Neutro Abs: 3.9 10*3/uL (ref 1.7–7.7)
Neutrophils Relative %: 64 % (ref 43–77)
Platelets: 200 10*3/uL (ref 150–400)
RBC: 4.66 MIL/uL (ref 4.22–5.81)
RDW: 13.5 % (ref 11.5–15.5)
WBC: 6.1 10*3/uL (ref 4.0–10.5)

## 2014-07-23 LAB — BASIC METABOLIC PANEL WITH GFR
BUN: 20 mg/dL (ref 6–23)
CHLORIDE: 104 meq/L (ref 96–112)
CO2: 28 mEq/L (ref 19–32)
CREATININE: 0.82 mg/dL (ref 0.50–1.35)
Calcium: 9.4 mg/dL (ref 8.4–10.5)
GFR, EST NON AFRICAN AMERICAN: 88 mL/min
GFR, Est African American: >89 mL/min
Glucose, Bld: 104 mg/dL — ABNORMAL HIGH (ref 70–99)
Potassium: 4.5 mEq/L (ref 3.5–5.3)
Sodium: 140 mEq/L (ref 135–145)

## 2014-07-23 LAB — LIPID PANEL
Cholesterol: 160 mg/dL (ref 0–200)
HDL: 36 mg/dL — AB (ref >=40)
LDL Cholesterol: 99 mg/dL (ref 0–99)
Total CHOL/HDL Ratio: 4.4 Ratio
Triglycerides: 126 mg/dL (ref <150)
VLDL: 25 mg/dL (ref 0–40)

## 2014-07-23 LAB — HEPATIC FUNCTION PANEL
ALBUMIN: 4.1 g/dL (ref 3.5–5.2)
ALK PHOS: 64 U/L (ref 39–117)
ALT: 29 U/L (ref 0–53)
AST: 25 U/L (ref 0–37)
Bilirubin, Direct: 0.2 mg/dL (ref 0.0–0.3)
Indirect Bilirubin: 0.7 mg/dL (ref 0.2–1.2)
TOTAL PROTEIN: 6.4 g/dL (ref 6.0–8.3)
Total Bilirubin: 0.9 mg/dL (ref 0.2–1.2)

## 2014-07-23 LAB — HEMOGLOBIN A1C
HEMOGLOBIN A1C: 6 % — AB (ref <5.7)
Mean Plasma Glucose: 126 mg/dL — ABNORMAL HIGH (ref <117)

## 2014-07-23 LAB — TSH: TSH: 1.38 u[IU]/mL (ref 0.350–4.500)

## 2014-07-23 LAB — MAGNESIUM: Magnesium: 1.9 mg/dL (ref 1.5–2.5)

## 2014-07-23 NOTE — Progress Notes (Signed)
Assessment and Plan:  Hypertension: Continue medication, monitor blood pressure at home. Continue DASH diet.  Reminder to go to the ER if any CP, SOB, nausea, dizziness, severe HA, changes vision/speech, left arm numbness and tingling, and jaw pain. Cholesterol: Continue diet and exercise. Check cholesterol.  Pre-diabetes-Continue diet and exercise. Check A1C Vitamin D Def- check level and continue medications.   Continue diet and meds as discussed. Further disposition pending results of labs.  HPI 74 y.o. male  presents for 3 month follow up with hypertension, hyperlipidemia, prediabetes and vitamin D.  His blood pressure has been controlled at home, today their BP is BP: 138/78 mmHg  He does workout, will start a garden in the spring and works out in the morning, and does stationary bike.  He denies chest pain, shortness of breath, dizziness.  He is on cholesterol medication and denies myalgias. His cholesterol is at goal. The cholesterol last visit was:   Lab Results  Component Value Date   CHOL 145 03/14/2014   HDL 48 03/14/2014   LDLCALC 84 03/14/2014   TRIG 66 03/14/2014   CHOLHDL 3.0 03/14/2014   He has been working on diet and exercise for prediabetes, and denies paresthesia of the feet, polydipsia, polyuria and visual disturbances. Last A1C in the office was:  Lab Results  Component Value Date   HGBA1C 5.7* 03/14/2014  Patient is on Vitamin D supplement.   Lab Results  Component Value Date   VD25OH 13* 03/14/2014      Current Medications:  Current Outpatient Prescriptions on File Prior to Visit  Medication Sig Dispense Refill  . allopurinol (ZYLOPRIM) 300 MG tablet Take 1 tablet (300 mg total) by mouth daily. 90 tablet PRN  . aspirin 81 MG tablet Take 81 mg by mouth daily.    Marland Kitchen atenolol (TENORMIN) 25 MG tablet Take 1 tablet (25 mg total) by mouth daily. 90 tablet 1  . atorvastatin (LIPITOR) 40 MG tablet Take 1 tablet (40 mg total) by mouth at bedtime. 30 tablet 3  .  Cholecalciferol (VITAMIN D PO) Take 2,000 Units by mouth. Takes 8000 units daily.    Marland Kitchen CRANBERRY PO Take by mouth.    . doxazosin (CARDURA) 8 MG tablet TAKE ONE (1) TABLET EACH DAY 90 tablet 0  . enalapril (VASOTEC) 20 MG tablet Take 1 tablet (20 mg total) by mouth daily. 180 tablet 1  . finasteride (PROSCAR) 5 MG tablet Take 5 mg by mouth daily.    . Garlic 2297 MG CAPS Take 1,000 mg by mouth daily.    . Magnesium 250 MG TABS Take 500 mg by mouth 2 (two) times daily.     . Omega-3 Fatty Acids (FISH OIL PO) Take by mouth.    Marland Kitchen PRESCRIPTION MEDICATION Take 0.5 tablets by mouth daily.    . Blood Glucose Monitoring Suppl (ONE TOUCH ULTRA SYSTEM KIT) W/DEVICE KIT 1 kit by Does not apply route once. test strips and lancets  (#100) - check glucose qd - Dx 250.00 / Magnus Sinning MD / NPI [id number] / 100 each 12   No current facility-administered medications on file prior to visit.   Medical History:  Past Medical History  Diagnosis Date  . Hypertension   . Hyperlipidemia   . Pre-diabetes   . Vitamin D deficiency   . Kidney stones   . GERD (gastroesophageal reflux disease)   . Nephrolithiasis   . Obstructive uropathy   . BPH with elevated PSA hx  . Allergy  Allergies: No Known Allergies   Review of Systems:  Review of Systems  Constitutional: Negative.   HENT: Negative.   Eyes: Negative.   Respiratory: Negative.   Cardiovascular: Negative.   Gastrointestinal: Positive for heartburn (mylanta helps). Negative for nausea, vomiting, abdominal pain, diarrhea, constipation, blood in stool and melena.  Musculoskeletal: Negative.   Skin: Negative.   Neurological: Negative.   Psychiatric/Behavioral: Negative.     Family history- Review and unchanged Social history- Review and unchanged Physical Exam: BP 138/78 mmHg  Pulse 56  Temp(Src) 97.7 F (36.5 C)  Resp 16  Ht 6' 3"  (1.905 m)  Wt 217 lb (98.431 kg)  BMI 27.12 kg/m2 Wt Readings from Last 3 Encounters:  07/23/14 217 lb  (98.431 kg)  03/14/14 211 lb 6.4 oz (95.89 kg)  12/31/13 213 lb (96.616 kg)   General Appearance: Well nourished, in no apparent distress. Eyes: PERRLA, EOMs, conjunctiva no swelling or erythema Sinuses: No Frontal/maxillary tenderness ENT/Mouth: Ext aud canals clear, TMs without erythema, bulging. No erythema, swelling, or exudate on post pharynx.  Tonsils not swollen or erythematous. Hearing normal.  Neck: Supple, thyroid normal.  Respiratory: Respiratory effort normal, BS equal bilaterally without rales, rhonchi, wheezing or stridor.  Cardio: RRR with no MRGs. Brisk peripheral pulses without edema.  Abdomen: Soft, + BS,  Non tender, no guarding, rebound, hernias, masses. Lymphatics: Non tender without lymphadenopathy.  Musculoskeletal: Full ROM, 5/5 strength, Normal gait Skin: Warm, dry without rashes, lesions, ecchymosis.  Neuro: Cranial nerves intact. Normal muscle tone, no cerebellar symptoms. Psych: Awake and oriented X 3, normal affect, Insight and Judgment appropriate.    Vicie Mutters, PA-C 8:49 AM [facility]

## 2014-07-23 NOTE — Patient Instructions (Signed)
    Bad carbs also include fruit juice, alcohol, and sweet tea. These are empty calories that do not signal to your brain that you are full.   Please remember the good carbs are still carbs which convert into sugar. So please measure them out no more than 1/2-1 cup of rice, oatmeal, pasta, and beans  Veggies are however free foods! Pile them on.   Not all fruit is created equal. Please see the list below, the fruit at the bottom is higher in sugars than the fruit at the top. Please avoid all dried fruits.     Benefiber is good for constipation/diarrhea/irritable bowel syndrome, it helps with weight loss and can help lower your bad cholesterol. Please do 1-2 TBSP in the morning in water, coffee, or tea. It can take up to a month before you can see a difference with your bowel movements. It is cheapest from [employer], [employer], [employer].    03/14/2014: LDL (calc) 84. Your LDL is the bad cholesterol that can lead to heart attack and stroke. To lower your number you can decrease your fatty foods, red meat, cheese, milk and increase fiber like whole grains and veggies. You can also add a fiber supplement like Metamucil or Benefiber.

## 2014-07-24 LAB — VITAMIN D 25 HYDROXY (VIT D DEFICIENCY, FRACTURES): Vit D, 25-Hydroxy: 58 ng/mL (ref 30–100)

## 2014-10-09 ENCOUNTER — Other Ambulatory Visit: Payer: Self-pay | Admitting: Internal Medicine

## 2014-10-29 ENCOUNTER — Ambulatory Visit (INDEPENDENT_AMBULATORY_CARE_PROVIDER_SITE_OTHER): Payer: PPO | Admitting: Internal Medicine

## 2014-10-29 ENCOUNTER — Encounter: Payer: Self-pay | Admitting: Internal Medicine

## 2014-10-29 VITALS — BP 128/68 | HR 56 | Temp 97.7°F | Resp 16 | Ht 75.5 in | Wt 214.4 lb

## 2014-10-29 DIAGNOSIS — R6889 Other general symptoms and signs: Secondary | ICD-10-CM

## 2014-10-29 DIAGNOSIS — Z0000 Encounter for general adult medical examination without abnormal findings: Secondary | ICD-10-CM

## 2014-10-29 DIAGNOSIS — Z1331 Encounter for screening for depression: Secondary | ICD-10-CM

## 2014-10-29 DIAGNOSIS — N400 Enlarged prostate without lower urinary tract symptoms: Secondary | ICD-10-CM

## 2014-10-29 DIAGNOSIS — E782 Mixed hyperlipidemia: Secondary | ICD-10-CM

## 2014-10-29 DIAGNOSIS — E790 Hyperuricemia without signs of inflammatory arthritis and tophaceous disease: Secondary | ICD-10-CM

## 2014-10-29 DIAGNOSIS — E559 Vitamin D deficiency, unspecified: Secondary | ICD-10-CM

## 2014-10-29 DIAGNOSIS — Z0001 Encounter for general adult medical examination with abnormal findings: Secondary | ICD-10-CM

## 2014-10-29 DIAGNOSIS — Z9181 History of falling: Secondary | ICD-10-CM

## 2014-10-29 DIAGNOSIS — I10 Essential (primary) hypertension: Secondary | ICD-10-CM

## 2014-10-29 DIAGNOSIS — R7309 Other abnormal glucose: Secondary | ICD-10-CM

## 2014-10-29 DIAGNOSIS — R7303 Prediabetes: Secondary | ICD-10-CM

## 2014-10-29 DIAGNOSIS — Z79899 Other long term (current) drug therapy: Secondary | ICD-10-CM

## 2014-10-29 LAB — CBC WITH DIFFERENTIAL/PLATELET
Basophils Absolute: 0.1 10*3/uL (ref 0.0–0.1)
Basophils Relative: 1 % (ref 0–1)
EOS ABS: 0.1 10*3/uL (ref 0.0–0.7)
Eosinophils Relative: 2 % (ref 0–5)
HCT: 42.9 % (ref 39.0–52.0)
HEMOGLOBIN: 14.2 g/dL (ref 13.0–17.0)
Lymphocytes Relative: 26 % (ref 12–46)
Lymphs Abs: 1.5 10*3/uL (ref 0.7–4.0)
MCH: 31.5 pg (ref 26.0–34.0)
MCHC: 33.1 g/dL (ref 30.0–36.0)
MCV: 95.1 fL (ref 78.0–100.0)
MPV: 10.1 fL (ref 8.6–12.4)
Monocytes Absolute: 0.5 10*3/uL (ref 0.1–1.0)
Monocytes Relative: 8 % (ref 3–12)
NEUTROS ABS: 3.6 10*3/uL (ref 1.7–7.7)
NEUTROS PCT: 63 % (ref 43–77)
Platelets: 198 10*3/uL (ref 150–400)
RBC: 4.51 MIL/uL (ref 4.22–5.81)
RDW: 13.5 % (ref 11.5–15.5)
WBC: 5.7 10*3/uL (ref 4.0–10.5)

## 2014-10-29 LAB — BASIC METABOLIC PANEL WITH GFR
BUN: 15 mg/dL (ref 6–23)
CO2: 25 mEq/L (ref 19–32)
Calcium: 9.2 mg/dL (ref 8.4–10.5)
Chloride: 104 mEq/L (ref 96–112)
Creat: 0.76 mg/dL (ref 0.50–1.35)
GFR, Est African American: >89 mL/min
GLUCOSE: 100 mg/dL — AB (ref 70–99)
Potassium: 4.6 mEq/L (ref 3.5–5.3)
Sodium: 140 mEq/L (ref 135–145)

## 2014-10-29 LAB — HEPATIC FUNCTION PANEL
ALBUMIN: 4.1 g/dL (ref 3.5–5.2)
ALT: 27 U/L (ref 0–53)
AST: 31 U/L (ref 0–37)
Alkaline Phosphatase: 58 U/L (ref 39–117)
BILIRUBIN TOTAL: 0.8 mg/dL (ref 0.2–1.2)
Bilirubin, Direct: 0.2 mg/dL (ref 0.0–0.3)
Indirect Bilirubin: 0.6 mg/dL (ref 0.2–1.2)
Total Protein: 6.2 g/dL (ref 6.0–8.3)

## 2014-10-29 LAB — TSH: TSH: 1.384 u[IU]/mL (ref 0.350–4.500)

## 2014-10-29 LAB — LIPID PANEL
CHOLESTEROL: 134 mg/dL (ref 0–200)
HDL: 42 mg/dL (ref >=40)
LDL CALC: 76 mg/dL (ref 0–99)
Total CHOL/HDL Ratio: 3.2 Ratio
Triglycerides: 78 mg/dL (ref <150)
VLDL: 16 mg/dL (ref 0–40)

## 2014-10-29 LAB — MAGNESIUM: Magnesium: 1.9 mg/dL (ref 1.5–2.5)

## 2014-10-29 NOTE — Patient Instructions (Signed)

## 2014-10-29 NOTE — Progress Notes (Signed)
Patient ID: Michael Waller, male   DOB: [birth year], 74 y.o.   MRN: [id number]  MEDICARE ANNUAL WELLNESS VISIT AND OV  Assessment:   1. Essential hypertension  - TSH  2. Mixed hyperlipidemia  - Lipid panel  3. Prediabetes  - Hemoglobin A1c - Insulin, random  4. Vitamin D deficiency  - Vit D  25 hydroxy   5. Hyperuricemia   6. BPH (benign prostatic hypertrophy)   7. Depression screen   8. At low risk for fall   9. Medication management  - CBC with Differential/Platelet - BASIC METABOLIC PANEL WITH GFR - Hepatic function panel - Magnesium  10. Routine general medical examination at a health care facility   Plan:   During the course of the visit the patient was educated and counseled about appropriate screening and preventive services including:    Pneumococcal vaccine   Influenza vaccine  Td vaccine  Screening electrocardiogram  Bone densitometry screening  Colorectal cancer screening  Diabetes screening  Glaucoma screening  Nutrition counseling   Advanced directives: requested  Screening recommendations, referrals: Vaccinations: Immunization History  Administered Date(s) Administered  . DTaP 08/31/2005  . PPD Test 03/17/2014  Influenza vaccine declined Pneumococcal vaccine declined Prevnar vaccine declined Shingles vaccine declined Hep B vaccine declined  Nutrition assessed and recommended  Colonoscopy 2008 Recommended yearly ophthalmology/optometry visit for glaucoma screening and checkup Recommended yearly dental visit for hygiene and checkup Advanced directives -  Offered forms, but declined  Conditions/risks identified: BMI: Discussed weight loss, diet, and increase physical activity.  Increase physical activity: AHA recommends 150 minutes of physical activity a week.  Medications reviewed PreDiabetes is not at goal, ACE/ARB therapy: Yes. Urinary Incontinence is not an issue: discussed non pharmacology and pharmacology  options.  Fall risk: low- discussed PT, home fall assessment, medications.   Subjective:    Michael Waller presents for Medicare Annual Wellness Visit and complete physical.  Date of last medicare wellness visit was 08/21/2013. This very nice 74 y.o. [name] presents also for 3 month follow up with Hypertension, Hyperlipidemia, Pre-Diabetes and Vitamin D Deficiency.    Patient is treated for HTN since 1986& BP has been controlled at home. Today's BP: 128/68 mmHg. Patient has had no complaints of any cardiac type chest pain, palpitations, dyspnea/orthopnea/PND, dizziness, claudication, or dependent edema.   Hyperlipidemia is controlled with diet & meds. Patient denies myalgias or other med SE's. Last Lipids were at goal - Chol 160; HDL 36; LDL  99; Trig 126 on 07/23/2014.   Also, the patient has history of PreDiabetes and has had no symptoms of reactive hypoglycemia, diabetic polys, paresthesias or visual blurring.  Last A1c was 6.0% on  07/23/2014.   Further, the patient also has history of Vitamin D Deficiency and supplements vitamin D without any suspected side-effects. Last vitamin D was 58 on 07/23/2014.  Names of Other Physician/Practitioners you currently use: 1. [facility] here for primary care 2. Dr Marica Otter, OD, eye doctor, last visit 03/25/2014 3. [facility], dentist, last visit was 2 weeks ago.  Patient Care Team: Unk Pinto, MD as PCP - General (Internal Medicine) Devra Dopp, MD as Referring Physician (Dermatology) Gatha Mayer, MD as Consulting Physician (Gastroenterology) Irine Seal, MD as Attending Physician (Urology) Marica Otter, OD (Optometry)  Medication Review: Medication Sig  . allopurinol (ZYLOPRIM) 300 MG tablet Take 1 tablet (300 mg total) by mouth daily.  Marland Kitchen aspirin 81 MG tablet Take 81 mg by mouth daily.  Marland Kitchen  atenolol (TENORMIN) 25 MG tablet TAKE 1 TABLET BY MOUTH DAILY  . atorvastatin (LIPITOR)  40 MG tablet Take 1 tablet (40 mg total) by mouth at bedtime.  . Cholecalciferol (VITAMIN D PO) Take 2,000 Units by mouth. Takes 8000 units daily.  Marland Kitchen CRANBERRY PO Take by mouth.  . doxazosin (CARDURA) 8 MG tablet TAKE ONE (1) TABLET EACH DAY  . enalapril (VASOTEC) 20 MG tablet Take 1 tablet (20 mg total) by mouth daily.  . finasteride (PROSCAR) 5 MG tablet Take 5 mg by mouth daily.  . Garlic 6579 MG CAPS Take 1,000 mg by mouth daily.  . Magnesium 250 MG TABS Take 500 mg by mouth 2 (two) times daily.   . Omega-3 Fatty Acids (FISH OIL PO) Take by mouth.   Current Problems (verified) Patient Active Problem List   Diagnosis Date Noted  . Medication management 11/15/2013  . Essential hypertension 05/22/2013  . Mixed hyperlipidemia 05/22/2013  . Prediabetes 05/22/2013  . Vitamin D deficiency 05/22/2013  . BPH (benign prostatic hypertrophy) 05/22/2013  . Hyperuricemia 05/22/2013   Screening Tests Health Maintenance  Topic Date Due  . PNA vac Low Risk Adult (1 of 2 - PCV13) 11/04/2005  . ZOSTAVAX  06/08/2016 (Originally 11/04/2000)  . INFLUENZA VACCINE  12/22/2014  . TETANUS/TDAP  05/03/2016  . COLONOSCOPY  03/14/2017    Immunization History  Administered Date(s) Administered  . DTaP 08/31/2005  . PPD Test 03/17/2014    Preventative care: Last colonoscopy: 2008  History reviewed: allergies, current medications, past family history, past medical history, past social history, past surgical history and problem list  Risk Factors: Tobacco History  Substance Use Topics  . Smoking status: Former Smoker    Quit date: 05/23/1965  . Smokeless tobacco: Not on file  . Alcohol Use: No   He does not smoke.  Patient is a former smoker. Are there smokers in your home (other than you)?  No  Alcohol Current alcohol use: none  Caffeine Current caffeine use: coffee 3 cups /day and caffeinated soft drinks 1 soda /day  Exercise Current exercise: gardening, walking and yard  work  Nutrition/Diet Current diet: in general, a "healthy" diet    Cardiac risk factors: advanced age (older than 64 for men, 21 for women), dyslipidemia, hypertension, male gender, microalbuminuria, sedentary lifestyle and smoking/ tobacco exposure.  Depression Screen (Note: if answer to either of the following is "Yes", a more complete depression screening is indicated)   Q1: Over the past two weeks, have you felt down, depressed or hopeless? No  Q2: Over the past two weeks, have you felt little interest or pleasure in doing things? No  Have you lost interest or pleasure in daily life? No  Do you often feel hopeless? No  Do you cry easily over simple problems? No  Activities of Daily Living In your present state of health, do you have any difficulty performing the following activities?:  Driving? No Managing money?  No Feeding yourself? No Getting from bed to chair? No Climbing a flight of stairs? No Preparing food and eating?: No Bathing or showering? No Getting dressed: No Getting to the toilet? No Using the toilet:No Moving around from place to place: No In the past year have you fallen or had a near fall?:No   Are you sexually active?  No  Do you have more than one partner?  No  Vision Difficulties: No  Hearing Difficulties: No Do you often ask people to speak up or repeat themselves? No Do  you experience ringing or noises in your ears? No Do you have difficulty understanding soft or whispered voices? No  Cognition  Do you feel that you have a problem with memory?No  Do you often misplace items? No  Do you feel safe at home?  Yes  Advanced directives Does patient have a Ingram? No Does patient have a Living Will? No  Past Medical History  Diagnosis Date  . Hypertension   . Hyperlipidemia   . Pre-diabetes   . Vitamin D deficiency   . Kidney stones   . GERD (gastroesophageal reflux disease)   . Nephrolithiasis   . Obstructive  uropathy   . BPH with elevated PSA hx  . Allergy    Past Surgical History  Procedure Laterality Date  . Lithotripsy  [phone]  . Prostate biopsy  2000, 12/2004    negative   ROS: Constitutional: Denies fever, chills, weight loss/gain, headaches, insomnia, fatigue, night sweats or change in appetite. Eyes: Denies redness, blurred vision, diplopia, discharge, itchy or watery eyes.  ENT: Denies discharge, congestion, post nasal drip, epistaxis, sore throat, earache, hearing loss, dental pain, Tinnitus, Vertigo, Sinus pain or snoring.  Cardio: Denies chest pain, palpitations, irregular heartbeat, syncope, dyspnea, diaphoresis, orthopnea, PND, claudication or edema Respiratory: denies cough, dyspnea, DOE, pleurisy, hoarseness, laryngitis or wheezing.  Gastrointestinal: Denies dysphagia, heartburn, reflux, water brash, pain, cramps, nausea, vomiting, bloating, diarrhea, constipation, hematemesis, melena, hematochezia, jaundice or hemorrhoids Genitourinary: Denies dysuria, frequency, urgency, nocturia, hesitancy, discharge, hematuria or flank pain Musculoskeletal: Denies arthralgia, myalgia, stiffness, Jt. Swelling, pain, limp or strain/sprain. Denies Falls. Skin: Denies puritis, rash, hives, warts, acne, eczema or change in skin lesion Neuro: No weakness, tremor, incoordination, spasms, paresthesia or pain Psychiatric: Denies confusion, memory loss or sensory loss. Denies Depression. Endocrine: Denies change in weight, skin, hair change, nocturia, and paresthesia, diabetic polys, visual blurring or hyper / hypo glycemic episodes.  Heme/Lymph: No excessive bleeding, bruising or enlarged lymph nodes.  Objective:     BP 128/68   Pulse 56  Temp 97.7 F   Resp 16  Ht 6' 3.5"   Wt 214 lb 6.4 oz     BMI 26.44  General Appearance:  Alert  WD/WN, male  in no apparent distress. Eyes: PERRLA, EOMs nl, conjunctiva normal, normal fundi and vessels. Sinuses: No frontal/maxillary  tenderness ENT/Mouth: EACs patent / TMs  nl. Nares clear without erythema, swelling, mucoid exudates. Oral hygiene is good. No erythema, swelling, or exudate. Tongue normal, non-obstructing. Tonsils not swollen or erythematous. Hearing normal.  Neck: Supple, thyroid normal. No bruits, nodes or JVD. Respiratory: Respiratory effort normal.  BS equal and clear bilateral without rales, rhonci, wheezing or stridor. Cardio: Heart sounds are normal with regular rate and rhythm and no murmurs, rubs or gallops. Peripheral pulses are normal and equal bilaterally without edema. No aortic or femoral bruits. Chest: symmetric with normal excursions and percussion.  Abdomen: Flat, soft, with nl bowel sounds. Nontender, no guarding, rebound, hernias, masses, or organomegaly.  Lymphatics: Non tender without lymphadenopathy.  Genitourinary: No hernias.Testes nl. DRE - prostate nl for age - smooth & firm w/o nodules. Musculoskeletal: Full ROM all peripheral extremities, joint stability, 5/5 strength, and normal gait. Skin: Warm and dry without rashes, lesions, cyanosis, clubbing or  ecchymosis.  Neuro: Cranial nerves intact, reflexes equal bilaterally. Normal muscle tone, no cerebellar symptoms. Sensation intact.  Pysch: Alert and oriented X 3 with normal affect, insight and judgment appropriate.   Cognitive Testing  Alert? Yes  Normal Appearance? Yes  Oriented to person? Yes  Place? Yes   Time? Yes  Recall of three objects?  Yes  Can perform simple calculations? Yes  Displays appropriate judgment? Yes  Can read the correct time from a watch/clock? Yes  Medicare Attestation I have personally reviewed: The patient's medical and social history Their use of alcohol, tobacco or illicit drugs Their current medications and supplements The patient's functional ability including ADLs,fall risks, home safety risks, cognitive, and hearing and visual impairment Diet and physical activities Evidence for depression or  mood disorders  The patient's weight, height, BMI, and visual acuity have been recorded in the chart.  I have made referrals, counseling, and provided education to the patient based on review of the above and I have provided the patient with a written personalized care plan for preventive services.  Over 40 minutes of exam, counseling, chart review was performed.  Mykira Hofmeister DAVID, MD   10/29/2014

## 2014-10-30 LAB — HEMOGLOBIN A1C
Hgb A1c MFr Bld: 5.8 % — ABNORMAL HIGH (ref <5.7)
Mean Plasma Glucose: 120 mg/dL — ABNORMAL HIGH (ref <117)

## 2014-10-30 LAB — VITAMIN D 25 HYDROXY (VIT D DEFICIENCY, FRACTURES): Vit D, 25-Hydroxy: 74 ng/mL (ref 30–100)

## 2014-10-30 LAB — INSULIN, RANDOM: INSULIN: 5 u[IU]/mL (ref 2.0–19.6)

## 2014-11-06 ENCOUNTER — Other Ambulatory Visit: Payer: Self-pay | Admitting: Internal Medicine

## 2014-11-17 ENCOUNTER — Other Ambulatory Visit: Payer: Self-pay

## 2014-11-20 ENCOUNTER — Other Ambulatory Visit: Payer: Self-pay

## 2014-11-20 MED ORDER — ATORVASTATIN CALCIUM 40 MG PO TABS: 40.0000 mg | ORAL_TABLET | Freq: Every day | ORAL | Status: DC

## 2014-12-25 ENCOUNTER — Other Ambulatory Visit: Payer: Self-pay | Admitting: Internal Medicine

## 2015-01-07 ENCOUNTER — Other Ambulatory Visit: Payer: Self-pay | Admitting: Internal Medicine

## 2015-02-04 ENCOUNTER — Ambulatory Visit: Payer: Self-pay | Admitting: Internal Medicine

## 2015-02-09 ENCOUNTER — Encounter: Payer: Self-pay | Admitting: Internal Medicine

## 2015-02-09 ENCOUNTER — Ambulatory Visit (INDEPENDENT_AMBULATORY_CARE_PROVIDER_SITE_OTHER): Payer: PPO | Admitting: Internal Medicine

## 2015-02-09 VITALS — BP 126/60 | HR 60 | Temp 98.2°F | Resp 16 | Ht 75.5 in | Wt 210.0 lb

## 2015-02-09 DIAGNOSIS — R7303 Prediabetes: Secondary | ICD-10-CM

## 2015-02-09 DIAGNOSIS — E782 Mixed hyperlipidemia: Secondary | ICD-10-CM

## 2015-02-09 DIAGNOSIS — E559 Vitamin D deficiency, unspecified: Secondary | ICD-10-CM | POA: Diagnosis not present

## 2015-02-09 DIAGNOSIS — R7309 Other abnormal glucose: Secondary | ICD-10-CM | POA: Diagnosis not present

## 2015-02-09 DIAGNOSIS — Z79899 Other long term (current) drug therapy: Secondary | ICD-10-CM | POA: Diagnosis not present

## 2015-02-09 DIAGNOSIS — I10 Essential (primary) hypertension: Secondary | ICD-10-CM

## 2015-02-09 LAB — BASIC METABOLIC PANEL WITH GFR
BUN: 22 mg/dL (ref 7–25)
CALCIUM: 9.3 mg/dL (ref 8.6–10.3)
CO2: 27 mmol/L (ref 20–31)
Chloride: 104 mmol/L (ref 98–110)
Creat: 0.8 mg/dL (ref 0.70–1.18)
GFR, EST NON AFRICAN AMERICAN: 88 mL/min (ref >=60)
Glucose, Bld: 105 mg/dL — ABNORMAL HIGH (ref 65–99)
Potassium: 4.6 mmol/L (ref 3.5–5.3)
SODIUM: 142 mmol/L (ref 135–146)

## 2015-02-09 LAB — CBC WITH DIFFERENTIAL/PLATELET
Basophils Absolute: 0 10*3/uL (ref 0.0–0.1)
Basophils Relative: 0 % (ref 0–1)
Eosinophils Absolute: 0.1 10*3/uL (ref 0.0–0.7)
Eosinophils Relative: 2 % (ref 0–5)
HCT: 41.2 % (ref 39.0–52.0)
HEMOGLOBIN: 13.9 g/dL (ref 13.0–17.0)
Lymphocytes Relative: 28 % (ref 12–46)
Lymphs Abs: 1.7 10*3/uL (ref 0.7–4.0)
MCH: 31.5 pg (ref 26.0–34.0)
MCHC: 33.7 g/dL (ref 30.0–36.0)
MCV: 93.4 fL (ref 78.0–100.0)
MONO ABS: 0.4 10*3/uL (ref 0.1–1.0)
MPV: 10.2 fL (ref 8.6–12.4)
Monocytes Relative: 7 % (ref 3–12)
NEUTROS ABS: 3.8 10*3/uL (ref 1.7–7.7)
Neutrophils Relative %: 63 % (ref 43–77)
Platelets: 196 10*3/uL (ref 150–400)
RBC: 4.41 MIL/uL (ref 4.22–5.81)
RDW: 13.6 % (ref 11.5–15.5)
WBC: 6 10*3/uL (ref 4.0–10.5)

## 2015-02-09 LAB — HEPATIC FUNCTION PANEL
ALT: 25 U/L (ref 9–46)
AST: 24 U/L (ref 10–35)
Albumin: 4 g/dL (ref 3.6–5.1)
Alkaline Phosphatase: 58 U/L (ref 40–115)
BILIRUBIN DIRECT: 0.1 mg/dL (ref <=0.2)
BILIRUBIN INDIRECT: 0.5 mg/dL (ref 0.2–1.2)
BILIRUBIN TOTAL: 0.6 mg/dL (ref 0.2–1.2)
Total Protein: 6.2 g/dL (ref 6.1–8.1)

## 2015-02-09 LAB — LIPID PANEL
CHOL/HDL RATIO: 3.1 ratio (ref <=5.0)
CHOLESTEROL: 157 mg/dL (ref 125–200)
HDL: 51 mg/dL (ref >=40)
LDL Cholesterol: 90 mg/dL (ref <130)
TRIGLYCERIDES: 80 mg/dL (ref <150)
VLDL: 16 mg/dL (ref <30)

## 2015-02-09 LAB — HEMOGLOBIN A1C
Hgb A1c MFr Bld: 5.8 % — ABNORMAL HIGH (ref <5.7)
Mean Plasma Glucose: 120 mg/dL — ABNORMAL HIGH (ref <117)

## 2015-02-09 LAB — TSH: TSH: 1.307 u[IU]/mL (ref 0.350–4.500)

## 2015-02-09 LAB — MAGNESIUM: Magnesium: 1.9 mg/dL (ref 1.5–2.5)

## 2015-02-09 NOTE — Progress Notes (Signed)
Patient ID: Michael Waller, male   DOB: 04-Oct-1940, 74 y.o.   MRN: [id number]  Assessment and Plan:  Hypertension:  -Continue medication,  -monitor blood pressure at home.  -Continue DASH diet.   -Reminder to go to the ER if any CP, SOB, nausea, dizziness, severe HA, changes vision/speech, left arm numbness and tingling, and jaw pain.  Cholesterol: -Continue diet and exercise.  -Check cholesterol.   Pre-diabetes: -Continue diet and exercise.  -Check A1C  Vitamin D Def: -check level -continue medications.   Continue diet and meds as discussed. Further disposition pending results of labs.  HPI 74 y.o. male  presents for 3 month follow up with hypertension, hyperlipidemia, prediabetes and vitamin D.   His blood pressure has been controlled at home, today their BP is BP: 126/60 mmHg.   He does workout. He denies chest pain, shortness of breath, dizziness.   He is on cholesterol medication and denies myalgias. His cholesterol is at goal. The cholesterol last visit was:   Lab Results  Component Value Date   CHOL 134 10/29/2014   HDL 42 10/29/2014   LDLCALC 76 10/29/2014   TRIG 78 10/29/2014   CHOLHDL 3.2 10/29/2014     He has been working on diet and exercise for prediabetes, and denies foot ulcerations, hyperglycemia, hypoglycemia , increased appetite, nausea, paresthesia of the feet, polydipsia, polyuria, visual disturbances, vomiting and weight loss. Last A1C in the office was:  Lab Results  Component Value Date   HGBA1C 5.8* 10/29/2014    Patient is on Vitamin D supplement.  Lab Results  Component Value Date   VD25OH 74 10/29/2014     Of note patient has been following with urology and has had a normal PSA this year.  He is followed there for BPH and also for recurrent kidney stones.    Current Medications:  Current Outpatient Prescriptions on File Prior to Visit  Medication Sig Dispense Refill  . allopurinol (ZYLOPRIM) 300 MG tablet Take 1 tablet (300 mg total) by  mouth daily. 90 tablet PRN  . aspirin 81 MG tablet Take 81 mg by mouth daily.    Marland Kitchen atenolol (TENORMIN) 25 MG tablet TAKE 1 TABLET BY MOUTH DAILY 90 tablet 1  . atorvastatin (LIPITOR) 40 MG tablet Take 1 tablet (40 mg total) by mouth at bedtime. 30 tablet 3  . Cholecalciferol (VITAMIN D PO) Take 2,000 Units by mouth. Takes 8000 units daily.    Marland Kitchen CRANBERRY PO Take by mouth.    . doxazosin (CARDURA) 8 MG tablet TAKE 1 TABLET BY MOUTH DAILY 90 tablet 3  . enalapril (VASOTEC) 20 MG tablet TAKE 1 TABLET BY MOUTH DAILY 180 tablet 0  . finasteride (PROSCAR) 5 MG tablet Take 5 mg by mouth daily.    . Garlic 4098 MG CAPS Take 1,000 mg by mouth daily.    . Magnesium 250 MG TABS Take 500 mg by mouth 2 (two) times daily.     . Omega-3 Fatty Acids (FISH OIL PO) Take by mouth.    . ONE TOUCH ULTRA TEST test strip TEST BLOOD SUGAR ONCE A DAY 50 each 2  . PRESCRIPTION MEDICATION Take 0.5 tablets by mouth daily.    . Blood Glucose Monitoring Suppl (ONE TOUCH ULTRA SYSTEM KIT) W/DEVICE KIT 1 kit by Does not apply route once. test strips and lancets  (#100) - check glucose qd - Dx 250.00 / Magnus Sinning MD / NPI [id number] / 100 each 12   No current facility-administered medications  on file prior to visit.    Medical History:  Past Medical History  Diagnosis Date  . Hypertension   . Hyperlipidemia   . Pre-diabetes   . Vitamin D deficiency   . Kidney stones   . GERD (gastroesophageal reflux disease)   . Nephrolithiasis   . Obstructive uropathy   . BPH with elevated PSA hx  . Allergy     Allergies: No Known Allergies   Review of Systems:  Review of Systems  Constitutional: Negative for fever, chills and malaise/fatigue.  HENT: Negative for congestion and sore throat.   Respiratory: Negative for cough, shortness of breath, wheezing and stridor.   Cardiovascular: Negative for chest pain, palpitations and leg swelling.  Gastrointestinal: Negative for heartburn.  Genitourinary: Negative.   Skin:  Negative.   Neurological: Negative for dizziness, sensory change, loss of consciousness and headaches.  Psychiatric/Behavioral: Negative for depression. The patient is not nervous/anxious and does not have insomnia.     Family history- Review and unchanged  Social history- Review and unchanged  Physical Exam: BP 126/60 mmHg  Pulse 60  Temp(Src) 98.2 F (36.8 C) (Temporal)  Resp 16  Ht 6' 3.5" (1.918 m)  Wt 210 lb (95.255 kg)  BMI 25.89 kg/m2 Wt Readings from Last 3 Encounters:  02/09/15 210 lb (95.255 kg)  10/29/14 214 lb 6.4 oz (97.251 kg)  07/23/14 217 lb (98.431 kg)    General Appearance: Well nourished well developed, in no apparent distress. Eyes: PERRLA, EOMs, conjunctiva no swelling or erythema ENT/Mouth: Ear canals normal without obstruction, swelling, erythma, discharge.  TMs normal bilaterally.  Oropharynx moist, clear, without exudate, or postoropharyngeal swelling. Neck: Supple, thyroid normal,no cervical adenopathy  Respiratory: Respiratory effort normal, Breath sounds clear A&P without rhonchi, wheeze, or rale.  No retractions, no accessory usage. Cardio: RRR with no MRGs. Brisk peripheral pulses without edema.  Abdomen: Soft, + BS,  Non tender, no guarding, rebound, hernias, masses. Musculoskeletal: Full ROM, 5/5 strength, Normal gait Skin: Warm, dry without rashes, lesions, ecchymosis.  Neuro: Awake and oriented X 3, Cranial nerves intact. Normal muscle tone, no cerebellar symptoms. Psych: Normal affect, Insight and Judgment appropriate.    Starlyn Skeans, PA-C 8:48 AM [facility]

## 2015-02-10 LAB — INSULIN, RANDOM: Insulin: 7.6 u[IU]/mL (ref 2.0–19.6)

## 2015-02-10 LAB — VITAMIN D 25 HYDROXY (VIT D DEFICIENCY, FRACTURES): Vit D, 25-Hydroxy: 81 ng/mL (ref 30–100)

## 2015-02-28 ENCOUNTER — Encounter: Payer: Self-pay | Admitting: *Deleted

## 2015-03-25 ENCOUNTER — Encounter: Payer: Self-pay | Admitting: Internal Medicine

## 2015-03-27 ENCOUNTER — Other Ambulatory Visit: Payer: Self-pay | Admitting: *Deleted

## 2015-03-27 MED ORDER — ENALAPRIL MALEATE 20 MG PO TABS: 20.0000 mg | ORAL_TABLET | Freq: Every day | ORAL | Status: DC

## 2015-04-02 ENCOUNTER — Encounter: Payer: Self-pay | Admitting: Internal Medicine

## 2015-04-03 ENCOUNTER — Other Ambulatory Visit: Payer: Self-pay

## 2015-04-03 MED ORDER — ATENOLOL 25 MG PO TABS: 25.0000 mg | ORAL_TABLET | Freq: Every day | ORAL | Status: DC

## 2015-05-06 ENCOUNTER — Other Ambulatory Visit: Payer: Self-pay

## 2015-05-06 MED ORDER — ENALAPRIL MALEATE 20 MG PO TABS: 20.0000 mg | ORAL_TABLET | Freq: Every day | ORAL | Status: DC

## 2015-05-15 ENCOUNTER — Other Ambulatory Visit: Payer: Self-pay | Admitting: Internal Medicine

## 2015-05-29 ENCOUNTER — Encounter: Payer: Self-pay | Admitting: Internal Medicine

## 2015-05-29 ENCOUNTER — Ambulatory Visit (INDEPENDENT_AMBULATORY_CARE_PROVIDER_SITE_OTHER): Payer: PPO | Admitting: Internal Medicine

## 2015-05-29 VITALS — BP 132/78 | HR 56 | Temp 98.1°F | Resp 16 | Ht 75.75 in | Wt 204.0 lb

## 2015-05-29 DIAGNOSIS — R7303 Prediabetes: Secondary | ICD-10-CM

## 2015-05-29 DIAGNOSIS — N320 Bladder-neck obstruction: Secondary | ICD-10-CM | POA: Diagnosis not present

## 2015-05-29 DIAGNOSIS — Z0001 Encounter for general adult medical examination with abnormal findings: Secondary | ICD-10-CM

## 2015-05-29 DIAGNOSIS — I10 Essential (primary) hypertension: Secondary | ICD-10-CM

## 2015-05-29 DIAGNOSIS — Z125 Encounter for screening for malignant neoplasm of prostate: Secondary | ICD-10-CM

## 2015-05-29 DIAGNOSIS — R7309 Other abnormal glucose: Secondary | ICD-10-CM | POA: Diagnosis not present

## 2015-05-29 DIAGNOSIS — E782 Mixed hyperlipidemia: Secondary | ICD-10-CM | POA: Diagnosis not present

## 2015-05-29 DIAGNOSIS — E559 Vitamin D deficiency, unspecified: Secondary | ICD-10-CM | POA: Diagnosis not present

## 2015-05-29 DIAGNOSIS — E790 Hyperuricemia without signs of inflammatory arthritis and tophaceous disease: Secondary | ICD-10-CM

## 2015-05-29 DIAGNOSIS — Z79899 Other long term (current) drug therapy: Secondary | ICD-10-CM

## 2015-05-29 DIAGNOSIS — Z0000 Encounter for general adult medical examination without abnormal findings: Secondary | ICD-10-CM

## 2015-05-29 DIAGNOSIS — R799 Abnormal finding of blood chemistry, unspecified: Secondary | ICD-10-CM | POA: Diagnosis not present

## 2015-05-29 DIAGNOSIS — Z1212 Encounter for screening for malignant neoplasm of rectum: Secondary | ICD-10-CM

## 2015-05-29 DIAGNOSIS — N400 Enlarged prostate without lower urinary tract symptoms: Secondary | ICD-10-CM

## 2015-05-29 LAB — CBC WITH DIFFERENTIAL/PLATELET
BASOS ABS: 0 10*3/uL (ref 0.0–0.1)
Basophils Relative: 0 % (ref 0–1)
EOS ABS: 0.1 10*3/uL (ref 0.0–0.7)
EOS PCT: 1 % (ref 0–5)
HEMATOCRIT: 45.7 % (ref 39.0–52.0)
Hemoglobin: 15.9 g/dL (ref 13.0–17.0)
LYMPHS ABS: 1.7 10*3/uL (ref 0.7–4.0)
LYMPHS PCT: 25 % (ref 12–46)
MCH: 32 pg (ref 26.0–34.0)
MCHC: 34.8 g/dL (ref 30.0–36.0)
MCV: 92 fL (ref 78.0–100.0)
MONO ABS: 0.4 10*3/uL (ref 0.1–1.0)
MONOS PCT: 6 % (ref 3–12)
MPV: 10 fL (ref 8.6–12.4)
Neutro Abs: 4.6 10*3/uL (ref 1.7–7.7)
Neutrophils Relative %: 68 % (ref 43–77)
PLATELETS: 193 10*3/uL (ref 150–400)
RBC: 4.97 MIL/uL (ref 4.22–5.81)
RDW: 13.6 % (ref 11.5–15.5)
WBC: 6.8 10*3/uL (ref 4.0–10.5)

## 2015-05-29 LAB — HEPATIC FUNCTION PANEL
ALBUMIN: 4.5 g/dL (ref 3.6–5.1)
ALK PHOS: 68 U/L (ref 40–115)
ALT: 29 U/L (ref 9–46)
AST: 25 U/L (ref 10–35)
Bilirubin, Direct: 0.2 mg/dL (ref <=0.2)
Indirect Bilirubin: 0.9 mg/dL (ref 0.2–1.2)
TOTAL PROTEIN: 6.7 g/dL (ref 6.1–8.1)
Total Bilirubin: 1.1 mg/dL (ref 0.2–1.2)

## 2015-05-29 LAB — BASIC METABOLIC PANEL WITH GFR
BUN: 15 mg/dL (ref 7–25)
CO2: 32 mmol/L — ABNORMAL HIGH (ref 20–31)
CREATININE: 0.89 mg/dL (ref 0.70–1.18)
Calcium: 9.6 mg/dL (ref 8.6–10.3)
Chloride: 100 mmol/L (ref 98–110)
GFR, Est Non African American: 84 mL/min (ref >=60)
GLUCOSE: 105 mg/dL — AB (ref 65–99)
POTASSIUM: 4.5 mmol/L (ref 3.5–5.3)
Sodium: 139 mmol/L (ref 135–146)

## 2015-05-29 LAB — LIPID PANEL
Cholesterol: 157 mg/dL (ref 125–200)
HDL: 54 mg/dL (ref >=40)
LDL Cholesterol: 87 mg/dL (ref <130)
TRIGLYCERIDES: 78 mg/dL (ref <150)
Total CHOL/HDL Ratio: 2.9 Ratio (ref <=5.0)
VLDL: 16 mg/dL (ref <30)

## 2015-05-29 LAB — HEMOGLOBIN A1C
HEMOGLOBIN A1C: 5.8 % — AB (ref <5.7)
Mean Plasma Glucose: 120 mg/dL — ABNORMAL HIGH (ref <117)

## 2015-05-29 LAB — MAGNESIUM: MAGNESIUM: 1.9 mg/dL (ref 1.5–2.5)

## 2015-05-29 LAB — URIC ACID: Uric Acid, Serum: 6 mg/dL (ref 4.0–7.8)

## 2015-05-29 NOTE — Progress Notes (Signed)
Patient ID: Michael Waller, male   DOB: 04-Jul-1940, 75 y.o.   MRN: [id number]  Annual  Screening/Preventative Visit And Comprehensive Evaluation & Examination  This very nice 75 y.o. [name] presents for presents for a Wellness/Preventative Visit & comprehensive evaluation and management of multiple medical co-morbidities.  Patient has been followed for HTN, Prediabetes, Hyperlipidemia and Vitamin D Deficiency.   HTN predates since 27. Patient's BP has been controlled at home.Today's BP: 132/78 mmHg. Patient denies any cardiac symptoms as chest pain, palpitations, shortness of breath, dizziness or ankle swelling.   Patient's hyperlipidemia is controlled with diet and medications. Patient denies myalgias or other medication SE's. Last lipids were at goal with Cholesterol 157; HDL 51; LDL 90; Triglycerides 80 on 02/09/2015.   Patient has prediabetes since 2006 with A1c 5.8% and patient denies reactive hypoglycemic symptoms, visual blurring, diabetic polys or paresthesias. He reports CBG's range betw 90-110 mg%. Last A1c was 5.8% on   02/09/2015.   Finally, patient has history of Vitamin D Deficiency  and last vitamin D was 81 on 02/09/2015.    Medication Sig  . allopurinol 300 MG Take 1 tab daily.  Marland Kitchen aspirin 81 MG  Take 81 mgdaily.  Marland Kitchen atenolol  25 MG Take 1 tab daily.  Marland Kitchen atorvastatin  40 MG Take 1 tab at bedtime.  Marland Kitchen VITAMIN D Takes 8000 units daily.  Marland Kitchen CRANBERRY PO Take by mouth.  . doxazosin  8 MG  TAKE 1 TAB DAILY  . enalapril  20 MG  Take 1 tab daily.  . finasteride  5 MG Take 5 mg  daily.  . Garlic 7867 MG  Take  daily.  . Magnesium 250 MG  Take 2  times daily.   . Omega-3 FISH OIL  Take by mouth.   No Known Allergies   Past Medical History  Diagnosis Date  . Hypertension   . Hyperlipidemia   . Pre-diabetes   . Vitamin D deficiency   . Kidney stones   . GERD (gastroesophageal reflux disease)   . Nephrolithiasis   . Obstructive uropathy   . BPH with elevated PSA hx  . Allergy     Health Maintenance  Topic Date Due  . PNA vac Low Risk Adult (1 of 2 - PCV13) 11/04/2005  . INFLUENZA VACCINE  12/22/2014  . ZOSTAVAX  06/08/2016 (Originally 11/04/2000)  . TETANUS/TDAP  05/03/2016  . COLONOSCOPY  03/14/2017   Immunization History  Administered Date(s) Administered  . DTaP 08/31/2005  . PPD Test 03/17/2014   Past Surgical History  Procedure Laterality Date  . Lithotripsy  [phone]  . Prostate biopsy  2000, 12/2004    negative   Family History  Problem Relation Age of Onset  . Heart disease Mother   . Diabetes Mother   . Diabetes Father   . Heart disease Father   . Heart attack Father   . Diabetes Sister   . Diabetes Brother   . Diabetes Brother   . Diabetes Brother   . Heart disease Brother   . Stroke Brother   . Cancer Sister     breast  . Cancer Sister     colon   Social History   Social History  . Marital Status: Married    Spouse Name: N/A  . Number of Children: N/A  . Years of Education: N/A   Occupational History  . 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    Social History Main Topics  . Smoking status: Former Smoker  Quit date: 05/23/1965  . Smokeless tobacco: Not on file  . Alcohol Use: No  . Drug Use: No  . Sexual Activity: Not on file    ROS Constitutional: Denies fever, chills, weight loss/gain, headaches, insomnia,  night sweats or change in appetite. Does c/o fatigue. Eyes: Denies redness, blurred vision, diplopia, discharge, itchy or watery eyes.  ENT: Denies discharge, congestion, post nasal drip, epistaxis, sore throat, earache, hearing loss, dental pain, Tinnitus, Vertigo, Sinus pain or snoring.  Cardio: Denies chest pain, palpitations, irregular heartbeat, syncope, dyspnea, diaphoresis, orthopnea, PND, claudication or edema Respiratory: denies cough, dyspnea, DOE, pleurisy, hoarseness, laryngitis or wheezing.  Gastrointestinal: Denies dysphagia, heartburn, reflux, water brash, pain, cramps, nausea, vomiting,  bloating, diarrhea, constipation, hematemesis, melena, hematochezia, jaundice or hemorrhoids Genitourinary: Denies dysuria, frequency, urgency, nocturia, hesitancy, discharge, hematuria or flank pain Musculoskeletal: Denies arthralgia, myalgia, stiffness, Jt. Swelling, pain, limp or strain/sprain. Denies Falls. Skin: Denies puritis, rash, hives, warts, acne, eczema or change in skin lesion Neuro: No weakness, tremor, incoordination, spasms, paresthesia or pain Psychiatric: Denies confusion, memory loss or sensory loss. Denies Depression. Endocrine: Denies change in weight, skin, hair change, nocturia, and paresthesia, diabetic polys, visual blurring or hyper / hypo glycemic episodes.  Heme/Lymph: No excessive bleeding, bruising or enlarged lymph nodes.  Physical Exam  BP 132/78 mmHg  Pulse 56  Temp(Src) 98.1 F (36.7 C)  Resp 16  Ht 6' 3.75" (1.924 m)  Wt 204 lb (92.534 kg)  BMI 25.00 kg/m2  General Appearance: Well nourished, in no apparent distress.  Eyes: PERRLA, EOMs, conjunctiva no swelling or erythema, normal fundi and vessels. Sinuses: No frontal/maxillary tenderness ENT/Mouth: EACs patent / TMs  nl. Nares clear without erythema, swelling, mucoid exudates. Oral hygiene is good. No erythema, swelling, or exudate. Tongue normal, non-obstructing. Tonsils not swollen or erythematous. Hearing normal.  Neck: Supple, thyroid normal. No bruits, nodes or JVD. Respiratory: Respiratory effort normal.  BS equal and clear bilateral without rales, rhonci, wheezing or stridor. Cardio: Heart sounds are normal with regular rate and rhythm and no murmurs, rubs or gallops. Peripheral pulses are normal and equal bilaterally without edema. No aortic or femoral bruits. Chest: symmetric with normal excursions and percussion.  Abdomen: Soft, with Nl bowel sounds. Nontender, no guarding, rebound, hernias, masses, or organomegaly.  Lymphatics: Non tender without lymphadenopathy.  Genitourinary:  DRE  -Deferred (had by Dr Jeffie Pollock 6 mo ago)  Musculoskeletal: Full ROM all peripheral extremities, joint stability, 5/5 strength, and normal gait. Skin: Warm and dry without rashes, lesions, cyanosis, clubbing or  ecchymosis.  Neuro: Cranial nerves intact, reflexes equal bilaterally. Normal muscle tone, no cerebellar symptoms. Sensation intact.  Pysch: Alert and oriented X 3 with normal affect, insight and judgment appropriate.   Assessment and Plan  1. Annual Preventative/Screening Exam    2. Essential hypertension  - Microalbumin / creatinine urine ratio - EKG 12-Lead - [place], RETROPERITNL ABD,  LTD - TSH  3. Mixed hyperlipidemia  - Lipid panel - TSH  4. Prediabetes  - Hemoglobin A1c - Insulin, random  5. Vitamin D deficiency  - VITAMIN D 25 Hydroxy   6. Hyperuricemia  - Uric acid  7. BPH (benign prostatic hypertrophy)   8. Screening for rectal cancer  - POC Hemoccult Bld/Stl   9. Prostate cancer screening  - PSA  10. Medication management  - Urinalysis, Routine w reflex microscopic  - CBC with Differential/Platelet - BASIC METABOLIC PANEL WITH GFR - Hepatic function panel - Magnesium   Continue prudent diet as discussed,  weight control, BP monitoring, regular exercise, and medications as discussed.  Discussed med effects and SE's. Routine screening labs and tests as requested with regular follow-up as recommended. Over 40 minutes of exam, counseling, chart review and high complex critical decision making was performed

## 2015-05-29 NOTE — Patient Instructions (Signed)

## 2015-05-30 LAB — URINALYSIS, ROUTINE W REFLEX MICROSCOPIC
BILIRUBIN URINE: NEGATIVE
GLUCOSE, UA: NEGATIVE
Hgb urine dipstick: NEGATIVE
KETONES UR: NEGATIVE
Leukocytes, UA: NEGATIVE
NITRITE: NEGATIVE
Protein, ur: NEGATIVE
SPECIFIC GRAVITY, URINE: 1.009 (ref 1.001–1.035)
pH: 7.5 (ref 5.0–8.0)

## 2015-05-30 LAB — MICROALBUMIN / CREATININE URINE RATIO
CREATININE, URINE: 61 mg/dL (ref 20–370)
Microalb Creat Ratio: 7 mcg/mg creat (ref <30)
Microalb, Ur: 0.4 mg/dL

## 2015-05-30 LAB — VITAMIN D 25 HYDROXY (VIT D DEFICIENCY, FRACTURES): Vit D, 25-Hydroxy: 86 ng/mL (ref 30–100)

## 2015-05-30 LAB — PSA: PSA: 1.68 ng/mL (ref <=4.00)

## 2015-05-30 LAB — INSULIN, RANDOM: Insulin: 6.4 u[IU]/mL (ref 2.0–19.6)

## 2015-05-30 LAB — TSH: TSH: 1.208 u[IU]/mL (ref 0.350–4.500)

## 2015-06-05 ENCOUNTER — Other Ambulatory Visit: Payer: Self-pay

## 2015-06-05 MED ORDER — ENALAPRIL MALEATE 20 MG PO TABS: 20.0000 mg | ORAL_TABLET | Freq: Every day | ORAL | Status: DC

## 2015-06-08 ENCOUNTER — Other Ambulatory Visit: Payer: Self-pay | Admitting: *Deleted

## 2015-06-08 MED ORDER — ENALAPRIL MALEATE 20 MG PO TABS: 20.0000 mg | ORAL_TABLET | Freq: Every day | ORAL | Status: DC

## 2015-07-06 ENCOUNTER — Other Ambulatory Visit: Payer: Self-pay | Admitting: *Deleted

## 2015-07-06 DIAGNOSIS — Z1212 Encounter for screening for malignant neoplasm of rectum: Secondary | ICD-10-CM

## 2015-07-06 LAB — POC HEMOCCULT BLD/STL (HOME/3-CARD/SCREEN)
Card #3 Fecal Occult Blood, POC: NEGATIVE
FECAL OCCULT BLD: NEGATIVE
FECAL OCCULT BLD: NEGATIVE

## 2015-07-22 ENCOUNTER — Other Ambulatory Visit: Payer: Self-pay

## 2015-07-22 MED ORDER — ATORVASTATIN CALCIUM 40 MG PO TABS: 40.0000 mg | ORAL_TABLET | Freq: Every day | ORAL | Status: DC

## 2015-09-18 ENCOUNTER — Ambulatory Visit (INDEPENDENT_AMBULATORY_CARE_PROVIDER_SITE_OTHER): Payer: PPO | Admitting: Internal Medicine

## 2015-09-18 ENCOUNTER — Encounter: Payer: Self-pay | Admitting: Internal Medicine

## 2015-09-18 VITALS — BP 136/62 | HR 58 | Temp 97.8°F | Resp 16 | Ht 75.75 in | Wt 200.0 lb

## 2015-09-18 DIAGNOSIS — I10 Essential (primary) hypertension: Secondary | ICD-10-CM | POA: Diagnosis not present

## 2015-09-18 DIAGNOSIS — R6889 Other general symptoms and signs: Secondary | ICD-10-CM | POA: Diagnosis not present

## 2015-09-18 DIAGNOSIS — E790 Hyperuricemia without signs of inflammatory arthritis and tophaceous disease: Secondary | ICD-10-CM | POA: Diagnosis not present

## 2015-09-18 DIAGNOSIS — E782 Mixed hyperlipidemia: Secondary | ICD-10-CM

## 2015-09-18 DIAGNOSIS — R7303 Prediabetes: Secondary | ICD-10-CM | POA: Diagnosis not present

## 2015-09-18 DIAGNOSIS — Z79899 Other long term (current) drug therapy: Secondary | ICD-10-CM | POA: Diagnosis not present

## 2015-09-18 DIAGNOSIS — Z0001 Encounter for general adult medical examination with abnormal findings: Secondary | ICD-10-CM

## 2015-09-18 DIAGNOSIS — N400 Enlarged prostate without lower urinary tract symptoms: Secondary | ICD-10-CM

## 2015-09-18 DIAGNOSIS — W57XXXA Bitten or stung by nonvenomous insect and other nonvenomous arthropods, initial encounter: Secondary | ICD-10-CM

## 2015-09-18 DIAGNOSIS — Z0000 Encounter for general adult medical examination without abnormal findings: Secondary | ICD-10-CM

## 2015-09-18 DIAGNOSIS — E559 Vitamin D deficiency, unspecified: Secondary | ICD-10-CM | POA: Diagnosis not present

## 2015-09-18 DIAGNOSIS — R7309 Other abnormal glucose: Secondary | ICD-10-CM | POA: Diagnosis not present

## 2015-09-18 LAB — CBC WITH DIFFERENTIAL/PLATELET
BASOS ABS: 0 {cells}/uL (ref 0–200)
Basophils Relative: 0 %
EOS ABS: 126 {cells}/uL (ref 15–500)
Eosinophils Relative: 2 %
HCT: 44.8 % (ref 38.5–50.0)
Hemoglobin: 14.9 g/dL (ref 13.2–17.1)
LYMPHS PCT: 22 %
Lymphs Abs: 1386 cells/uL (ref 850–3900)
MCH: 31.3 pg (ref 27.0–33.0)
MCHC: 33.3 g/dL (ref 32.0–36.0)
MCV: 94.1 fL (ref 80.0–100.0)
MONOS PCT: 8 %
MPV: 10.1 fL (ref 7.5–12.5)
Monocytes Absolute: 504 cells/uL (ref 200–950)
Neutro Abs: 4284 cells/uL (ref 1500–7800)
Neutrophils Relative %: 68 %
PLATELETS: 198 10*3/uL (ref 140–400)
RBC: 4.76 MIL/uL (ref 4.20–5.80)
RDW: 13.6 % (ref 11.0–15.0)
WBC: 6.3 10*3/uL (ref 3.8–10.8)

## 2015-09-18 LAB — HEPATIC FUNCTION PANEL
ALBUMIN: 4.4 g/dL (ref 3.6–5.1)
ALT: 23 U/L (ref 9–46)
AST: 21 U/L (ref 10–35)
Alkaline Phosphatase: 67 U/L (ref 40–115)
BILIRUBIN DIRECT: 0.2 mg/dL (ref <=0.2)
BILIRUBIN TOTAL: 0.8 mg/dL (ref 0.2–1.2)
Indirect Bilirubin: 0.6 mg/dL (ref 0.2–1.2)
Total Protein: 6.8 g/dL (ref 6.1–8.1)

## 2015-09-18 LAB — BASIC METABOLIC PANEL WITH GFR
BUN: 14 mg/dL (ref 7–25)
CHLORIDE: 101 mmol/L (ref 98–110)
CO2: 28 mmol/L (ref 20–31)
CREATININE: 0.87 mg/dL (ref 0.70–1.18)
Calcium: 9.5 mg/dL (ref 8.6–10.3)
GFR, Est Non African American: 85 mL/min (ref >=60)
Glucose, Bld: 106 mg/dL — ABNORMAL HIGH (ref 65–99)
Potassium: 4.5 mmol/L (ref 3.5–5.3)
Sodium: 141 mmol/L (ref 135–146)

## 2015-09-18 LAB — LIPID PANEL
CHOL/HDL RATIO: 2.9 ratio (ref <=5.0)
Cholesterol: 144 mg/dL (ref 125–200)
HDL: 49 mg/dL (ref >=40)
LDL Cholesterol: 77 mg/dL (ref <130)
Triglycerides: 89 mg/dL (ref <150)
VLDL: 18 mg/dL (ref <30)

## 2015-09-18 LAB — TSH: TSH: 1.2 mIU/L (ref 0.40–4.50)

## 2015-09-18 LAB — HEMOGLOBIN A1C
Hgb A1c MFr Bld: 5.7 % — ABNORMAL HIGH (ref <5.7)
Mean Plasma Glucose: 117 mg/dL

## 2015-09-18 MED ORDER — TRIAMCINOLONE ACETONIDE 0.1 % EX CREA: 1.0000 "application " | TOPICAL_CREAM | Freq: Four times a day (QID) | CUTANEOUS | Status: DC | PRN

## 2015-09-18 MED ORDER — DOXYCYCLINE HYCLATE 100 MG PO CAPS: 100.0000 mg | ORAL_CAPSULE | Freq: Two times a day (BID) | ORAL | Status: DC

## 2015-09-18 NOTE — Progress Notes (Signed)
MEDICARE ANNUAL WELLNESS VISIT AND FOLLOW UP Assessment:    1. Essential hypertension -BP well controlled -cont to monitor at home -diet and exercise -cont meds - TSH  2. Mixed hyperlipidemia -diet and exercise -cont lipitor - Lipid panel  3. Prediabetes -diet and exercise - Hemoglobin A1c  4. Vitamin D deficiency -cont supplement  5. BPH (benign prostatic hypertrophy) -cont doxazosin  6. Hyperuricemia -cont allopurinol  7. Medication management  - CBC with Differential/Platelet - BASIC METABOLIC PANEL WITH GFR - Hepatic function panel  8. Routine general medical examination at a health care facility -due next year  9. Tick bite -doxycycline 100 mg BID x 14 days -kenalog cream -unknown exposure length to tick.    Over 30 minutes of exam, counseling, chart review, and critical decision making was performed  Plan:   During the course of the visit the patient was educated and counseled about appropriate screening and preventive services including:    Pneumococcal vaccine   Influenza vaccine  Prevnar 13  Td vaccine  Screening electrocardiogram  Colorectal cancer screening  Diabetes screening  Glaucoma screening  Nutrition counseling   Conditions/risks identified: BMI: Discussed weight loss, diet, and increase physical activity.  Increase physical activity: AHA recommends 150 minutes of physical activity a week.  Medications reviewed Diabetes is at goal, ACE/ARB therapy: Yes. Urinary Incontinence is not an issue: discussed non pharmacology and pharmacology options.  Fall risk: low- discussed PT, home fall assessment, medications.    Subjective:  Michael Waller is a 75 y.o. male who presents for Medicare Annual Wellness Visit and 3 month follow up for HTN, hyperlipidemia, prediabetes, and vitamin D Def.  Date of last medicare wellness visit was is unknown.  His blood pressure has been controlled at home, today their BP is BP: 136/62 mmHg He  does not workout. He denies chest pain, shortness of breath, dizziness.  He does try to get some walking in.    He is on cholesterol medication and denies myalgias. His cholesterol is at goal. The cholesterol last visit was:   Lab Results  Component Value Date   CHOL 157 05/29/2015   HDL 54 05/29/2015   LDLCALC 87 05/29/2015   TRIG 78 05/29/2015   CHOLHDL 2.9 05/29/2015   He has been working on diet and exercise for prediabetes, and denies foot ulcerations, hyperglycemia, hypoglycemia , increased appetite, nausea, paresthesia of the feet, polydipsia, polyuria, visual disturbances, vomiting and weight loss. Last A1C in the office was:  Lab Results  Component Value Date   HGBA1C 5.8* 05/29/2015   Last GFR Lab Results  Component Value Date   GFRNONAA 84 05/29/2015     Lab Results  Component Value Date   GFRAA >89 05/29/2015   Patient is on Vitamin D supplement.   Lab Results  Component Value Date   VD25OH 15 05/29/2015     Patient reports that he was bit by a tick on his left ankle on Tuesday.  Not sre how long the tick was on there.  He was able to remove it.   Medication Review: Current Outpatient Prescriptions on File Prior to Visit  Medication Sig Dispense Refill  . allopurinol (ZYLOPRIM) 300 MG tablet Take 1 tablet (300 mg total) by mouth daily. 90 tablet PRN  . aspirin 81 MG tablet Take 81 mg by mouth daily.    Marland Kitchen atenolol (TENORMIN) 25 MG tablet Take 1 tablet (25 mg total) by mouth daily. 90 tablet 1  . atorvastatin (LIPITOR) 40 MG tablet  Take 1 tablet (40 mg total) by mouth at bedtime. 30 tablet 3  . Blood Glucose Monitoring Suppl (ONE TOUCH ULTRA SYSTEM KIT) W/DEVICE KIT 1 kit by Does not apply route once. test strips and lancets  (#100) - check glucose qd - Dx 250.00 / Magnus Sinning MD / NPI [id number] / 100 each 12  . Cholecalciferol (VITAMIN D PO) Take 2,000 Units by mouth. Takes 8000 units daily.    Marland Kitchen CRANBERRY PO Take by mouth.    . doxazosin (CARDURA) 8 MG tablet  TAKE 1 TABLET BY MOUTH DAILY 90 tablet 3  . enalapril (VASOTEC) 20 MG tablet Take 1 tablet (20 mg total) by mouth daily. 90 tablet 1  . finasteride (PROSCAR) 5 MG tablet Take 5 mg by mouth daily.    . Garlic 2706 MG CAPS Take 1,000 mg by mouth daily.    . Magnesium 250 MG TABS Take 500 mg by mouth 2 (two) times daily.     . Omega-3 Fatty Acids (FISH OIL PO) Take by mouth.    . ONE TOUCH ULTRA TEST test strip USE TO CHECK BLOOD SUGAR ONCE DAILY OR AS DIRECTED 50 each 0  . PRESCRIPTION MEDICATION Take 0.5 tablets by mouth daily.     No current facility-administered medications on file prior to visit.    Current Problems (verified) Patient Active Problem List   Diagnosis Date Noted  . Medication management 11/15/2013  . Essential hypertension 05/22/2013  . Mixed hyperlipidemia 05/22/2013  . Prediabetes 05/22/2013  . Vitamin D deficiency 05/22/2013  . BPH (benign prostatic hypertrophy) 05/22/2013  . Hyperuricemia 05/22/2013    Screening Tests Immunization History  Administered Date(s) Administered  . DTaP 08/31/2005  . PPD Test 03/17/2014    Preventative care: Last colonoscopy: 2018  Prior vaccinations: TD or Tdap: 2007  Influenza: Declined  Pneumococcal: Declined Prevnar13: Declined Shingles/Zostavax: Declined  Names of Other Physician/Practitioners you currently use: 1. [facility] here for primary care 2. Dr. Sabra Heck, eye doctor, last visit 2016 3. Does not go regularly  Patient Care Team: Unk Pinto, MD as PCP - General (Internal Medicine) Devra Dopp, MD as Referring Physician (Dermatology) Gatha Mayer, MD as Consulting Physician (Gastroenterology) Irine Seal, MD as Attending Physician (Urology) Marica Otter, OD (Optometry)  Past Surgical History  Procedure Laterality Date  . Lithotripsy  [phone]  . Prostate biopsy  2000, 12/2004    negative   Family History  Problem Relation Age of Onset  .  Heart disease Mother   . Diabetes Mother   . Diabetes Father   . Heart disease Father   . Heart attack Father   . Diabetes Sister   . Diabetes Brother   . Diabetes Brother   . Diabetes Brother   . Heart disease Brother   . Stroke Brother   . Cancer Sister     breast  . Cancer Sister     colon   Social History  Substance Use Topics  . Smoking status: Former Smoker    Quit date: 05/23/1965  . Smokeless tobacco: None  . Alcohol Use: No   No Known Allergies  MEDICARE WELLNESS OBJECTIVES: Tobacco use: He does not smoke.  Patient is a former smoker. If yes, counseling given Alcohol Current alcohol use: none Diet: well balanced Physical activity: Current Exercise Habits: Home exercise routine, Type of exercise: walking, Time (Minutes): 25, Frequency (Times/Week): 6, Weekly Exercise (Minutes/Week): 150, Intensity: Mild Cardiac risk factors: Cardiac Risk Factors include: advanced  age (>20mn, >>46women);hypertension;male gender;smoking/ tobacco exposure Depression/mood screen:   [facility]18/[facility]  Decreased Interest 0  Down, Depressed, Hopeless 0  PHQ - 2 Score 0    ADLs:  In your present state of health, do you have any difficulty performing the following activities: 09/18/2015 05/30/2015  Hearing? N N  Vision? N N  Difficulty concentrating or making decisions? N N  Walking or climbing stairs? N N  Dressing or bathing? N N  Doing errands, shopping? N N  Preparing Food and eating ? N -  Using the Toilet? N -  In the past six months, have you accidently leaked urine? N -  Do you have problems with loss of bowel control? N -  Managing your Medications? N -  Managing your Finances? N -  Housekeeping or managing your Housekeeping? N -     Cognitive Testing  Alert? Yes  Normal Appearance?Yes  Oriented to person? Yes  Place? Yes   Time? Yes  Recall of three objects?  Yes  Can perform simple calculations? Yes  Displays appropriate judgment?Yes  Can read  the correct time from a watch face?Yes  EOL planning: Does patient have an advance directive?: No Would patient like information on creating an advanced directive?: Yes - Educational materials given   Objective:   Today's Vitals   09/18/15 0837  BP: 136/62  Pulse: 58  Temp: 97.8 F (36.6 C)  TempSrc: Temporal  Resp: 16  Height: 6' 3.75" (1.924 m)  Weight: 200 lb (90.719 kg)   Body mass index is 24.51 kg/(m^2).  General appearance: alert, no distress, WD/WN, male HEENT: normocephalic, sclerae anicteric, TMs pearly, nares patent, no discharge or erythema, pharynx normal Oral cavity: MMM, no lesions Neck: supple, no lymphadenopathy, no thyromegaly, no masses Heart: RRR, normal S1, S2, no murmurs Lungs: CTA bilaterally, no wheezes, rhonchi, or rales Abdomen: +bs, soft, non tender, non distended, no masses, no hepatomegaly, no splenomegaly Musculoskeletal: nontender, no swelling, no obvious deformity Extremities: no edema, no cyanosis, no clubbing Pulses: 2+ symmetric, upper and lower extremities, normal cap refill Neurological: alert, oriented x 3, CN2-12 intact, strength normal upper extremities and lower extremities, sensation normal throughout, DTRs 2+ throughout, no cerebellar signs, gait normal Psychiatric: normal affect, behavior normal, pleasant   Medicare Attestation I have personally reviewed: The patient's medical and social history Their use of alcohol, tobacco or illicit drugs Their current medications and supplements The patient's functional ability including ADLs,fall risks, home safety risks, cognitive, and hearing and visual impairment Diet and physical activities Evidence for depression or mood disorders  The patient's weight, height, BMI, and visual acuity have been recorded in the chart.  I have made referrals, counseling, and provided education to the patient based on review of the above and I have provided the patient with a written personalized care plan  for preventive services.     CStarlyn Skeans PA-C   09/18/2015

## 2015-09-23 ENCOUNTER — Other Ambulatory Visit: Payer: Self-pay

## 2015-09-23 MED ORDER — ATENOLOL 25 MG PO TABS: 25.0000 mg | ORAL_TABLET | Freq: Every day | ORAL | Status: DC

## 2015-10-14 DIAGNOSIS — Z0000 Encounter for general adult medical examination without abnormal findings: Secondary | ICD-10-CM | POA: Diagnosis not present

## 2015-10-14 DIAGNOSIS — N200 Calculus of kidney: Secondary | ICD-10-CM | POA: Diagnosis not present

## 2015-10-21 DIAGNOSIS — Z0000 Encounter for general adult medical examination without abnormal findings: Secondary | ICD-10-CM | POA: Diagnosis not present

## 2015-10-21 DIAGNOSIS — N200 Calculus of kidney: Secondary | ICD-10-CM | POA: Diagnosis not present

## 2015-12-18 ENCOUNTER — Encounter: Payer: Self-pay | Admitting: Internal Medicine

## 2015-12-18 ENCOUNTER — Ambulatory Visit (INDEPENDENT_AMBULATORY_CARE_PROVIDER_SITE_OTHER): Payer: PPO | Admitting: Internal Medicine

## 2015-12-18 VITALS — BP 126/80 | HR 52 | Temp 97.3°F | Resp 16 | Ht 75.75 in | Wt 200.8 lb

## 2015-12-18 DIAGNOSIS — E559 Vitamin D deficiency, unspecified: Secondary | ICD-10-CM

## 2015-12-18 DIAGNOSIS — I10 Essential (primary) hypertension: Secondary | ICD-10-CM | POA: Diagnosis not present

## 2015-12-18 DIAGNOSIS — Z79899 Other long term (current) drug therapy: Secondary | ICD-10-CM

## 2015-12-18 DIAGNOSIS — E782 Mixed hyperlipidemia: Secondary | ICD-10-CM | POA: Diagnosis not present

## 2015-12-18 DIAGNOSIS — E790 Hyperuricemia without signs of inflammatory arthritis and tophaceous disease: Secondary | ICD-10-CM | POA: Diagnosis not present

## 2015-12-18 DIAGNOSIS — R7303 Prediabetes: Secondary | ICD-10-CM

## 2015-12-18 LAB — CBC WITH DIFFERENTIAL/PLATELET
BASOS PCT: 1 %
Basophils Absolute: 60 cells/uL (ref 0–200)
EOS PCT: 2 %
Eosinophils Absolute: 120 cells/uL (ref 15–500)
HEMATOCRIT: 44.2 % (ref 38.5–50.0)
Hemoglobin: 14.8 g/dL (ref 13.2–17.1)
LYMPHS PCT: 28 %
Lymphs Abs: 1680 cells/uL (ref 850–3900)
MCH: 31.5 pg (ref 27.0–33.0)
MCHC: 33.5 g/dL (ref 32.0–36.0)
MCV: 94 fL (ref 80.0–100.0)
MPV: 10 fL (ref 7.5–12.5)
Monocytes Absolute: 420 cells/uL (ref 200–950)
Monocytes Relative: 7 %
NEUTROS PCT: 62 %
Neutro Abs: 3720 cells/uL (ref 1500–7800)
PLATELETS: 178 10*3/uL (ref 140–400)
RBC: 4.7 MIL/uL (ref 4.20–5.80)
RDW: 13.3 % (ref 11.0–15.0)
WBC: 6 10*3/uL (ref 3.8–10.8)

## 2015-12-18 LAB — HEPATIC FUNCTION PANEL
ALBUMIN: 4.3 g/dL (ref 3.6–5.1)
ALT: 21 U/L (ref 9–46)
AST: 25 U/L (ref 10–35)
Alkaline Phosphatase: 61 U/L (ref 40–115)
BILIRUBIN DIRECT: 0.2 mg/dL (ref <=0.2)
BILIRUBIN TOTAL: 0.9 mg/dL (ref 0.2–1.2)
Indirect Bilirubin: 0.7 mg/dL (ref 0.2–1.2)
Total Protein: 6.7 g/dL (ref 6.1–8.1)

## 2015-12-18 LAB — LIPID PANEL
CHOLESTEROL: 154 mg/dL (ref 125–200)
HDL: 57 mg/dL (ref >=40)
LDL Cholesterol: 85 mg/dL (ref <130)
TRIGLYCERIDES: 58 mg/dL (ref <150)
Total CHOL/HDL Ratio: 2.7 Ratio (ref <=5.0)
VLDL: 12 mg/dL (ref <30)

## 2015-12-18 LAB — BASIC METABOLIC PANEL WITH GFR
BUN: 16 mg/dL (ref 7–25)
CALCIUM: 9.5 mg/dL (ref 8.6–10.3)
CO2: 28 mmol/L (ref 20–31)
Chloride: 100 mmol/L (ref 98–110)
Creat: 0.85 mg/dL (ref 0.70–1.18)
GFR, EST NON AFRICAN AMERICAN: 85 mL/min (ref >=60)
GLUCOSE: 94 mg/dL (ref 65–99)
POTASSIUM: 4.6 mmol/L (ref 3.5–5.3)
SODIUM: 140 mmol/L (ref 135–146)

## 2015-12-18 LAB — HEMOGLOBIN A1C
Hgb A1c MFr Bld: 5.7 % — ABNORMAL HIGH (ref <5.7)
Mean Plasma Glucose: 117 mg/dL

## 2015-12-18 LAB — MAGNESIUM: MAGNESIUM: 2 mg/dL (ref 1.5–2.5)

## 2015-12-18 LAB — TSH: TSH: 1.21 m[IU]/L (ref 0.40–4.50)

## 2015-12-18 NOTE — Progress Notes (Signed)
[facility]                       Unk Pinto, M.D.        Uvaldo Bristle. Silverio Lay, P.A.-C       Starlyn Skeans, P.A.-C   [facility]                [address] [place], [place] [address] Telephone [phone] Telefax [phone] ______________________________________________________________________     This very nice 75 y.o. [name] presents for 6 month follow up with Hypertension, Hyperlipidemia, Pre-Diabetes, Gout and Vitamin D Deficiency.      Patient is treated for HTN circa 1986 & BP has been controlled at home. Today's BP: 126/80. Patient has had no complaints of any cardiac type chest pain, palpitations, dyspnea/orthopnea/PND, dizziness, claudication, or dependent edema.     Hyperlipidemia is controlled with diet & meds. Patient denies myalgias or other med SE's. Last Lipids were  Lab Results  Component Value Date   CHOL 154 12/18/2015   HDL 57 12/18/2015   LDLCALC 85 12/18/2015   TRIG 58 12/18/2015   CHOLHDL 2.7 12/18/2015      Also, the patient has history of PreDiabetes with A1c 5.8% in 2006 and he  reports  FBG's are running <120's. A He denies symptoms of reactive hypoglycemia, diabetic polys, paresthesias or visual blurring.  Last A1c was  Lab Results  Component Value Date   HGBA1C 5.7 (H) 12/18/2015      Patient has hx/o Gout apparently controlled with diet & medications. Further, the patient also has history of Vitamin D Deficiency of 49 on treatment in 2008 and supplements vitamin D without any suspected side-effects. Last vitamin D was   Lab Results  Component Value Date   VD25OH 66 12/18/2015   Current Outpatient Prescriptions on File Prior to Visit  Medication Sig  . allopurinol (ZYLOPRIM) 300 MG tablet Take 1 tablet (300 mg total) by mouth daily.  Marland Kitchen aspirin 81 MG tablet Take 81 mg by mouth daily.  Marland Kitchen atenolol (TENORMIN) 25 MG tablet Take 1 tablet (25 mg total) by mouth  daily.  Marland Kitchen atorvastatin (LIPITOR) 40 MG tablet Take 1 tablet (40 mg total) by mouth at bedtime.  . Cholecalciferol (VITAMIN D PO) Take 2,000 Units by mouth. Takes 8000 units daily.  Marland Kitchen CRANBERRY PO Take by mouth.  . doxazosin (CARDURA) 8 MG tablet TAKE 1 TABLET BY MOUTH DAILY  . enalapril (VASOTEC) 20 MG tablet Take 1 tablet (20 mg total) by mouth daily.  . finasteride (PROSCAR) 5 MG tablet Take 5 mg by mouth daily.  . Garlic 7209 MG CAPS Take 1,000 mg by mouth daily.  . Magnesium 250 MG TABS Take 500 mg by mouth 2 (two) times daily.   . Omega-3 Fatty Acids (FISH OIL PO) Take by mouth.  . ONE TOUCH ULTRA TEST test strip USE TO CHECK BLOOD SUGAR ONCE DAILY OR AS DIRECTED  . PRESCRIPTION MEDICATION Take 0.5 tablets by mouth daily.  . Blood Glucose Monitoring Suppl (ONE TOUCH ULTRA SYSTEM KIT) W/DEVICE KIT 1 kit by Does not apply route once. test strips and lancets  (#100) - check glucose qd - Dx 250.00 / Magnus Sinning MD / NPI [id number] /   No current facility-administered medications on file prior to visit.    No Known Allergies  PMHx:  Past Medical History:  Diagnosis Date  . Allergy   . BPH with elevated PSA hx  . GERD (gastroesophageal reflux disease)   . Hyperlipidemia   . Hypertension   . Kidney stones   . Nephrolithiasis   . Obstructive uropathy   . Pre-diabetes   . Vitamin D deficiency    Immunization History  Administered Date(s) Administered  . DTaP 08/31/2005  . PPD Test 03/17/2014   Past Surgical History:  Procedure Laterality Date  . LITHOTRIPSY  [phone]  . PROSTATE BIOPSY  2000, 12/2004   negative   FHx:    Reviewed / unchanged  SHx:    Reviewed / unchanged  Systems Review:  Constitutional: Denies fever, chills, wt changes, headaches, insomnia, fatigue, night sweats, change in appetite. Eyes: Denies redness, blurred vision, diplopia, discharge, itchy, watery eyes.  ENT: Denies discharge, congestion, post nasal drip, epistaxis, sore throat, earache,  hearing loss, dental pain, tinnitus, vertigo, sinus pain, snoring.  CV: Denies chest pain, palpitations, irregular heartbeat, syncope, dyspnea, diaphoresis, orthopnea, PND, claudication or edema. Respiratory: denies cough, dyspnea, DOE, pleurisy, hoarseness, laryngitis, wheezing.  Gastrointestinal: Denies dysphagia, odynophagia, heartburn, reflux, water brash, abdominal pain or cramps, nausea, vomiting, bloating, diarrhea, constipation, hematemesis, melena, hematochezia  or hemorrhoids. Genitourinary: Denies dysuria, frequency, urgency, nocturia, hesitancy, discharge, hematuria or flank pain. Musculoskeletal: Denies arthralgias, myalgias, stiffness, jt. swelling, pain, limping or strain/sprain.  Skin: Denies pruritus, rash, hives, warts, acne, eczema or change in skin lesion(s). Neuro: No weakness, tremor, incoordination, spasms, paresthesia or pain. Psychiatric: Denies confusion, memory loss or sensory loss. Endo: Denies change in weight, skin or hair change.  Heme/Lymph: No excessive bleeding, bruising or enlarged lymph nodes.  Physical Exam  BP 126/80   Pulse (!) 52   Temp 97.3 F (36.3 C)   Resp 16   Ht 6' 3.75" (1.924 m)   Wt 200 lb 12.8 oz (91.1 kg)   BMI 24.60 kg/m   Appears well nourished and in no distress. Eyes: PERRLA, EOMs, conjunctiva no swelling or erythema. Sinuses: No frontal/maxillary tenderness ENT/Mouth: EAC's clear, TM's nl w/o erythema, bulging. Nares clear w/o erythema, swelling, exudates. Oropharynx clear without erythema or exudates. Oral hygiene is good. Tongue normal, non obstructing. Hearing intact.  Neck: Supple. Thyroid nl. Car 2+/2+ without bruits, nodes or JVD. Chest: Respirations nl with BS clear & equal w/o rales, rhonchi, wheezing or stridor.  Cor: Heart sounds normal w/ regular rate and rhythm without sig. murmurs, gallops, clicks, or rubs. Peripheral pulses normal and equal  without edema.  Abdomen: Soft & bowel sounds normal. Non-tender w/o  guarding, rebound, hernias, masses, or organomegaly.  Lymphatics: Unremarkable.  Musculoskeletal: Full ROM all peripheral extremities, joint stability, 5/5 strength, and normal gait.  Skin: Warm, dry without exposed rashes, lesions or ecchymosis apparent.  Neuro: Cranial nerves intact, reflexes equal bilaterally. Sensory-motor testing grossly intact. Tendon reflexes grossly intact.  Pysch: Alert & oriented x 3.  Insight and judgement nl & appropriate. No ideations.  Assessment and Plan:  1. Essential hypertension  - Continue medication, monitor blood pressure at home. Continue DASH diet. Reminder to go to the ER if any CP, SOB, nausea, dizziness, severe HA, changes vision/speech, left arm numbness and tingling and jaw pain.  - TSH  2. Mixed hyperlipidemia  - Continue diet/meds, exercise,& lifestyle modifications. Continue monitor periodic cholesterol/liver & renal functions  - Lipid panel - TSH  3. Prediabetes  - Continue diet, exercise, lifestyle modifications. Monitor appropriate labs. - Hemoglobin A1c - Insulin, random  4. Vitamin  D deficiency  - Continue supplementation. - VITAMIN D 25 Hydroxy   5. Hyperuricemia   6. Medication management  - CBC with Differential/Platelet - BASIC METABOLIC PANEL WITH GFR - Hepatic function panel - Magnesium   Recommended regular exercise, BP monitoring, weight control, and discussed med and SE's. Recommended labs to assess and monitor clinical status. Further disposition pending results of labs. Over 30 minutes of exam, counseling, chart review was performed

## 2015-12-18 NOTE — Patient Instructions (Signed)

## 2015-12-19 ENCOUNTER — Encounter: Payer: Self-pay | Admitting: Internal Medicine

## 2015-12-19 LAB — VITAMIN D 25 HYDROXY (VIT D DEFICIENCY, FRACTURES): VIT D 25 HYDROXY: 85 ng/mL (ref 30–100)

## 2015-12-19 LAB — INSULIN, RANDOM: INSULIN: 4.5 u[IU]/mL (ref 2.0–19.6)

## 2016-01-09 ENCOUNTER — Other Ambulatory Visit: Payer: Self-pay | Admitting: Internal Medicine

## 2016-01-11 ENCOUNTER — Other Ambulatory Visit: Payer: Self-pay | Admitting: *Deleted

## 2016-01-11 MED ORDER — ALLOPURINOL 300 MG PO TABS: 300.0000 mg | ORAL_TABLET | Freq: Every day | ORAL | 4 refills | Status: DC

## 2016-01-13 DIAGNOSIS — R972 Elevated prostate specific antigen [PSA]: Secondary | ICD-10-CM | POA: Diagnosis not present

## 2016-01-13 DIAGNOSIS — R351 Nocturia: Secondary | ICD-10-CM | POA: Diagnosis not present

## 2016-01-13 DIAGNOSIS — N200 Calculus of kidney: Secondary | ICD-10-CM | POA: Diagnosis not present

## 2016-01-13 DIAGNOSIS — N401 Enlarged prostate with lower urinary tract symptoms: Secondary | ICD-10-CM | POA: Diagnosis not present

## 2016-01-26 ENCOUNTER — Other Ambulatory Visit: Payer: Self-pay | Admitting: *Deleted

## 2016-01-26 MED ORDER — ENALAPRIL MALEATE 20 MG PO TABS: 20.0000 mg | ORAL_TABLET | Freq: Every day | ORAL | 1 refills | Status: DC

## 2016-02-17 DIAGNOSIS — Z23 Encounter for immunization: Secondary | ICD-10-CM | POA: Diagnosis not present

## 2016-02-17 DIAGNOSIS — L814 Other melanin hyperpigmentation: Secondary | ICD-10-CM | POA: Diagnosis not present

## 2016-02-17 DIAGNOSIS — L570 Actinic keratosis: Secondary | ICD-10-CM | POA: Diagnosis not present

## 2016-02-17 DIAGNOSIS — D1801 Hemangioma of skin and subcutaneous tissue: Secondary | ICD-10-CM | POA: Diagnosis not present

## 2016-02-17 DIAGNOSIS — D225 Melanocytic nevi of trunk: Secondary | ICD-10-CM | POA: Diagnosis not present

## 2016-02-17 DIAGNOSIS — L820 Inflamed seborrheic keratosis: Secondary | ICD-10-CM | POA: Diagnosis not present

## 2016-02-17 DIAGNOSIS — L821 Other seborrheic keratosis: Secondary | ICD-10-CM | POA: Diagnosis not present

## 2016-03-14 DIAGNOSIS — N401 Enlarged prostate with lower urinary tract symptoms: Secondary | ICD-10-CM | POA: Diagnosis not present

## 2016-03-14 DIAGNOSIS — N200 Calculus of kidney: Secondary | ICD-10-CM | POA: Diagnosis not present

## 2016-03-14 DIAGNOSIS — R351 Nocturia: Secondary | ICD-10-CM | POA: Diagnosis not present

## 2016-03-14 DIAGNOSIS — R102 Pelvic and perineal pain: Secondary | ICD-10-CM | POA: Diagnosis not present

## 2016-03-14 DIAGNOSIS — R972 Elevated prostate specific antigen [PSA]: Secondary | ICD-10-CM | POA: Diagnosis not present

## 2016-03-16 ENCOUNTER — Other Ambulatory Visit: Payer: Self-pay

## 2016-03-16 MED ORDER — ATORVASTATIN CALCIUM 40 MG PO TABS: 40.0000 mg | ORAL_TABLET | Freq: Every day | ORAL | 3 refills | Status: DC

## 2016-03-18 ENCOUNTER — Encounter: Payer: Self-pay | Admitting: Physician Assistant

## 2016-03-18 ENCOUNTER — Ambulatory Visit (INDEPENDENT_AMBULATORY_CARE_PROVIDER_SITE_OTHER): Payer: PPO | Admitting: Physician Assistant

## 2016-03-18 VITALS — BP 120/64 | HR 83 | Temp 97.5°F | Resp 14 | Ht 75.75 in | Wt 201.0 lb

## 2016-03-18 DIAGNOSIS — R7303 Prediabetes: Secondary | ICD-10-CM | POA: Diagnosis not present

## 2016-03-18 DIAGNOSIS — E559 Vitamin D deficiency, unspecified: Secondary | ICD-10-CM

## 2016-03-18 DIAGNOSIS — E790 Hyperuricemia without signs of inflammatory arthritis and tophaceous disease: Secondary | ICD-10-CM

## 2016-03-18 DIAGNOSIS — E782 Mixed hyperlipidemia: Secondary | ICD-10-CM | POA: Diagnosis not present

## 2016-03-18 DIAGNOSIS — Z79899 Other long term (current) drug therapy: Secondary | ICD-10-CM | POA: Diagnosis not present

## 2016-03-18 DIAGNOSIS — I10 Essential (primary) hypertension: Secondary | ICD-10-CM

## 2016-03-18 DIAGNOSIS — N401 Enlarged prostate with lower urinary tract symptoms: Secondary | ICD-10-CM

## 2016-03-18 LAB — HEPATIC FUNCTION PANEL
ALBUMIN: 4.3 g/dL (ref 3.6–5.1)
ALT: 22 U/L (ref 9–46)
AST: 26 U/L (ref 10–35)
Alkaline Phosphatase: 59 U/L (ref 40–115)
BILIRUBIN TOTAL: 0.9 mg/dL (ref 0.2–1.2)
Bilirubin, Direct: 0.2 mg/dL (ref <=0.2)
Indirect Bilirubin: 0.7 mg/dL (ref 0.2–1.2)
TOTAL PROTEIN: 6.5 g/dL (ref 6.1–8.1)

## 2016-03-18 LAB — TSH: TSH: 1.17 m[IU]/L (ref 0.40–4.50)

## 2016-03-18 LAB — MAGNESIUM: MAGNESIUM: 1.9 mg/dL (ref 1.5–2.5)

## 2016-03-18 LAB — CBC WITH DIFFERENTIAL/PLATELET
BASOS ABS: 0 {cells}/uL (ref 0–200)
Basophils Relative: 0 %
EOS ABS: 65 {cells}/uL (ref 15–500)
Eosinophils Relative: 1 %
HCT: 43.6 % (ref 38.5–50.0)
Hemoglobin: 14.7 g/dL (ref 13.2–17.1)
Lymphocytes Relative: 26 %
Lymphs Abs: 1690 cells/uL (ref 850–3900)
MCH: 31.6 pg (ref 27.0–33.0)
MCHC: 33.7 g/dL (ref 32.0–36.0)
MCV: 93.8 fL (ref 80.0–100.0)
MONOS PCT: 9 %
MPV: 10.4 fL (ref 7.5–12.5)
Monocytes Absolute: 585 cells/uL (ref 200–950)
NEUTROS ABS: 4160 {cells}/uL (ref 1500–7800)
Neutrophils Relative %: 64 %
PLATELETS: 189 10*3/uL (ref 140–400)
RBC: 4.65 MIL/uL (ref 4.20–5.80)
RDW: 13.8 % (ref 11.0–15.0)
WBC: 6.5 10*3/uL (ref 3.8–10.8)

## 2016-03-18 LAB — BASIC METABOLIC PANEL WITH GFR
BUN: 16 mg/dL (ref 7–25)
CHLORIDE: 102 mmol/L (ref 98–110)
CO2: 31 mmol/L (ref 20–31)
CREATININE: 0.79 mg/dL (ref 0.70–1.18)
Calcium: 9.3 mg/dL (ref 8.6–10.3)
GFR, Est African American: >89 mL/min (ref >=60)
GFR, Est Non African American: 88 mL/min (ref >=60)
GLUCOSE: 84 mg/dL (ref 65–99)
Potassium: 4.3 mmol/L (ref 3.5–5.3)
Sodium: 140 mmol/L (ref 135–146)

## 2016-03-18 LAB — LIPID PANEL
Cholesterol: 135 mg/dL (ref 125–200)
HDL: 52 mg/dL (ref >=40)
LDL Cholesterol: 71 mg/dL (ref <130)
Total CHOL/HDL Ratio: 2.6 Ratio (ref <=5.0)
Triglycerides: 58 mg/dL (ref <150)
VLDL: 12 mg/dL (ref <30)

## 2016-03-18 LAB — HEMOGLOBIN A1C
HEMOGLOBIN A1C: 5.4 % (ref <5.7)
Mean Plasma Glucose: 108 mg/dL

## 2016-03-18 NOTE — Patient Instructions (Signed)
Interstitial Cystitis Interstitial cystitis is a condition that causes inflammation of the bladder. The bladder is a hollow organ in the lower part of your abdomen. It stores urine after the urine is made by your kidneys. With interstitial cystitis, you may have pain in the bladder area. You may also have a frequent and urgent need to urinate. The severity of interstitial cystitis can vary from person to person. You may have flare-ups of the condition, and then it may go away for a while. For many people who have this condition, it becomes a long-term problem. CAUSES The cause of this condition is not known. RISK FACTORS This condition is more likely to develop in women. SYMPTOMS Symptoms of interstitial cystitis vary, and they can change over time. Symptoms may include:  Discomfort or pain in the bladder area. This can range from mild to severe. The pain may change in intensity as the bladder fills with urine or as it empties.  Pelvic pain.  An urgent need to urinate.  Frequent urination.  Pain during sexual intercourse.  Pinpoint bleeding on the bladder wall. For women, the symptoms often get worse during menstruation. DIAGNOSIS This condition is diagnosed by evaluating your symptoms and ruling out other causes. A physical exam will be done. Various tests may be done to rule out other conditions. Common tests include:  Urine tests.  Cystoscopy. In this test, a tool that is like a very thin telescope is used to look into your bladder.  Biopsy. This involves taking a sample of tissue from the bladder wall to be examined under a microscope. TREATMENT There is no cure for interstitial cystitis, but treatment methods are available to control your symptoms. Work closely with your health care provider to find the treatments that will be most effective for you. Treatment options may include:  Medicines to relieve pain and to help reduce the number of times that you feel the need to  urinate.  Bladder training. This involves learning ways to control when you urinate, such as:  Urinating at scheduled times.  Training yourself to delay urination.  Doing exercises (Kegel exercises) to strengthen the muscles that control urine flow.  Lifestyle changes, such as changing your diet or taking steps to control stress.  Use of a device that provides electrical stimulation in order to reduce pain.  A procedure that stretches your bladder by filling it with air or fluid.  Surgery. This is rare. It is only done for extreme cases if other treatments do not help. HOME CARE INSTRUCTIONS  Take medicines only as directed by your health care provider.  Use bladder training techniques as directed.  Keep a bladder diary to find out which foods, liquids, or activities make your symptoms worse.  Use your bladder diary to schedule bathroom trips. If you are away from home, plan to be near a bathroom at each of your scheduled times.  Make sure you urinate just before you leave the house and just before you go to bed.  Do Kegel exercises as directed by your health care provider.  Do not drink alcohol.  Do not use any tobacco products, including cigarettes, chewing tobacco, or electronic cigarettes. If you need help quitting, ask your health care provider.  Make dietary changes as directed by your health care provider. You may need to avoid spicy foods and foods that contain a high amount of potassium.  Limit your drinking of beverages that stimulate urination. These include soda, coffee, and tea.  Keep all follow-up   visits as directed by your health care provider. This is important. SEEK MEDICAL CARE IF:  Your symptoms do not get better after treatment.  Your pain and discomfort are getting worse.  You have more frequent urges to urinate.  You have a fever. SEEK IMMEDIATE MEDICAL CARE IF:  You are not able to control your bladder at all.   This information is not  intended to replace advice given to you by your health care provider. Make sure you discuss any questions you have with your health care provider.   Document Released: 01/08/2004 Document Revised: 05/30/2014 Document Reviewed: 01/14/2014 Elsevier Interactive Patient Education 2016 [employer]  

## 2016-03-18 NOTE — Progress Notes (Signed)
Patient ID: RAHMON HEIGL, male   DOB: [birth year], 75 y.o.   MRN: [id number]  Assessment and Plan:  Hypertension:  -Continue medication,  -monitor blood pressure at home.  -Continue DASH diet.   -Reminder to go to the ER if any CP, SOB, nausea, dizziness, severe HA, changes vision/speech, left arm numbness and tingling, and jaw pain.  Cholesterol: -Continue diet and exercise.  -Check cholesterol.   Pre-diabetes: -Continue diet and exercise.  -Check A1C  Vitamin D Def: -check level -continue medications.   Continue diet and meds as discussed. Further disposition pending results of labs.  HPI 75 y.o. male  presents for 3 month follow up with hypertension, hyperlipidemia, prediabetes and vitamin D.   His blood pressure has been controlled at home, today their BP is BP: 120/64.   He does workout. He denies chest pain, shortness of breath, dizziness. Patient was concerned his heart rate was 83, no symptoms, recheck heart rate in the 60's. Explained likely from his walking to the room and this is normal.   Has history of kidney stones, follows with Dr. Jeffie Pollock.    He is on cholesterol medication and denies myalgias. His cholesterol is at goal. The cholesterol last visit was:   Lab Results  Component Value Date   CHOL 154 12/18/2015   HDL 57 12/18/2015   LDLCALC 85 12/18/2015   TRIG 58 12/18/2015   CHOLHDL 2.7 12/18/2015    He has been working on diet and exercise for prediabetes, and denies foot ulcerations, hyperglycemia, hypoglycemia , increased appetite, nausea, paresthesia of the feet, polydipsia, polyuria, visual disturbances, vomiting and weight loss. Last A1C in the office was:  Lab Results  Component Value Date   HGBA1C 5.7 (H) 12/18/2015   Patient is on Vitamin D supplement.  Lab Results  Component Value Date   VD25OH 18 12/18/2015      Current Medications:  Current Outpatient Prescriptions on File Prior to Visit  Medication Sig Dispense Refill  . allopurinol  (ZYLOPRIM) 300 MG tablet Take 1 tablet (300 mg total) by mouth daily. 90 tablet 4  . aspirin 81 MG tablet Take 81 mg by mouth daily.    Marland Kitchen atenolol (TENORMIN) 25 MG tablet Take 1 tablet (25 mg total) by mouth daily. 90 tablet 1  . atorvastatin (LIPITOR) 40 MG tablet Take 1 tablet (40 mg total) by mouth at bedtime. 30 tablet 3  . Cholecalciferol (VITAMIN D PO) Take 2,000 Units by mouth. Takes 8000 units daily.    Marland Kitchen doxazosin (CARDURA) 8 MG tablet TAKE 1 TABLET BY MOUTH DAILY 90 tablet 1  . enalapril (VASOTEC) 20 MG tablet Take 1 tablet (20 mg total) by mouth daily. 90 tablet 1  . finasteride (PROSCAR) 5 MG tablet Take 5 mg by mouth daily.    . Garlic 2706 MG CAPS Take 1,000 mg by mouth daily.    . Magnesium 250 MG TABS Take 500 mg by mouth 2 (two) times daily.     . Omega-3 Fatty Acids (FISH OIL PO) Take by mouth.    . ONE TOUCH ULTRA TEST test strip USE TO CHECK BLOOD SUGAR ONCE DAILY OR AS DIRECTED 50 each 0  . PRESCRIPTION MEDICATION Take 0.5 tablets by mouth daily.    . Blood Glucose Monitoring Suppl (ONE TOUCH ULTRA SYSTEM KIT) W/DEVICE KIT 1 kit by Does not apply route once. test strips and lancets  (#100) - check glucose qd - Dx 250.00 / Magnus Sinning MD / NPI [id number] / 100  each 12   No current facility-administered medications on file prior to visit.     Medical History:  Past Medical History:  Diagnosis Date  . Allergy   . BPH with elevated PSA hx  . GERD (gastroesophageal reflux disease)   . Hyperlipidemia   . Hypertension   . Kidney stones   . Nephrolithiasis   . Obstructive uropathy   . Pre-diabetes   . Vitamin D deficiency    Allergies: No Known Allergies   Review of Systems:  Review of Systems  Constitutional: Negative for chills, fever and malaise/fatigue.  HENT: Negative for congestion and sore throat.   Respiratory: Negative for cough, shortness of breath, wheezing and stridor.   Cardiovascular: Negative for chest pain, palpitations and leg swelling.   Gastrointestinal: Negative for heartburn.  Genitourinary: Negative.   Skin: Negative.   Neurological: Negative for dizziness, sensory change, loss of consciousness and headaches.  Psychiatric/Behavioral: Negative for depression. The patient is not nervous/anxious and does not have insomnia.     Family history- Review and unchanged  Social history- Review and unchanged  Physical Exam: BP 120/64   Pulse 83   Temp 97.5 F (36.4 C)   Resp 14   Ht 6' 3.75" (1.924 m)   Wt 201 lb (91.2 kg)   SpO2 97%   BMI 24.63 kg/m  Wt Readings from Last 3 Encounters:  03/18/16 201 lb (91.2 kg)  12/18/15 200 lb 12.8 oz (91.1 kg)  09/18/15 200 lb (90.7 kg)    General Appearance: Well nourished well developed, in no apparent distress. Eyes: PERRLA, EOMs, conjunctiva no swelling or erythema ENT/Mouth: Ear canals normal without obstruction, swelling, erythma, discharge.  TMs normal bilaterally.  Oropharynx moist, clear, without exudate, or postoropharyngeal swelling. Neck: Supple, thyroid normal,no cervical adenopathy  Respiratory: Respiratory effort normal, Breath sounds clear A&P without rhonchi, wheeze, or rale.  No retractions, no accessory usage. Cardio: RRR with no MRGs. Brisk peripheral pulses without edema.  Abdomen: Soft, + BS,  Non tender, no guarding, rebound, hernias, masses. Musculoskeletal: Full ROM, 5/5 strength, Normal gait Skin: Warm, dry without rashes, lesions, ecchymosis.  Neuro: Awake and oriented X 3, Cranial nerves intact. Normal muscle tone, no cerebellar symptoms. Psych: Normal affect, Insight and Judgment appropriate.    Vicie Mutters, PA-C 8:59 AM [facility]

## 2016-03-19 LAB — VITAMIN D 25 HYDROXY (VIT D DEFICIENCY, FRACTURES): VIT D 25 HYDROXY: 86 ng/mL (ref 30–100)

## 2016-03-30 ENCOUNTER — Other Ambulatory Visit: Payer: Self-pay

## 2016-03-30 MED ORDER — ATENOLOL 25 MG PO TABS: 25.0000 mg | ORAL_TABLET | Freq: Every day | ORAL | 1 refills | Status: DC

## 2016-04-04 DIAGNOSIS — N202 Calculus of kidney with calculus of ureter: Secondary | ICD-10-CM | POA: Diagnosis not present

## 2016-04-04 DIAGNOSIS — R102 Pelvic and perineal pain: Secondary | ICD-10-CM | POA: Diagnosis not present

## 2016-04-06 ENCOUNTER — Other Ambulatory Visit: Payer: Self-pay | Admitting: Urology

## 2016-04-08 ENCOUNTER — Encounter (HOSPITAL_BASED_OUTPATIENT_CLINIC_OR_DEPARTMENT_OTHER): Payer: Self-pay | Admitting: *Deleted

## 2016-04-11 ENCOUNTER — Encounter (HOSPITAL_BASED_OUTPATIENT_CLINIC_OR_DEPARTMENT_OTHER): Payer: Self-pay | Admitting: *Deleted

## 2016-04-11 NOTE — Progress Notes (Signed)
NPO AFTER MN.  ARRIVE AT 0745.  NEEDS ISTAT.  CURRENT EKG IN CHART AND EPIC.

## 2016-04-18 NOTE — H&P (Signed)
CC/HPI: Perineal discomfort.   Mr. Michael Waller returns today in f/u. He has some increased discomfort in the perineum. The pain started just prior to his visit in late October. His PSA was down then as well. He pain has gotten worse. He also has some mild to moderate discomfort in his LLQ. He has had no gross hematuria but may have passed something a few days ago. His symptoms are similar to his prior stones. He has had no nausea. He has a clear urine today. He has no difficulty urinating.     ALLERGIES: No Allergies    MEDICATIONS: Allopurinol 300 mg tablet  Finasteride 5 mg tablet 1 tablet PO Daily  Lipitor 40 mg tablet  Alfalfa 650 mg tablet  Aspir 81  Atenolol 100 mg tablet  Doxazosin Mesylate 8 mg tablet  Enalapril Maleate 20 mg tablet  Fish Oil  Garlic [id number] mg capsule  Magnesium 250 mg tablet  Multi-Day Vitamins  Vitamin D  Zantac     GU PSH: Biopsy Skin Lesion - 2013 Renal ESWL - 2013      [facility] Notes: Hand Surgery, Lithotripsy, Biopsy Skin   NON-GU PSH: None   GU PMH: BPH w/LUTS (Stable), He is voiding well on current therapy. - 03/14/2016, Benign prostatic hyperplasia with urinary obstruction, - 12/30/2014 Kidney Stone, Stable LLP stones. - 03/14/2016, (Stable), He has a stable cluster of LLP stones and has had some intermittent symptoms but it is difficult to say if they are from stones or musculoskeletal. , - 01/13/2016, Nephrolithiasis, - 10/21/2015 Pelvic and perineal pain, mild, intermittent in the perineum and anal areas. - 03/14/2016 Elevated PSA, Elevated prostate specific antigen (PSA) - 12/30/2014 Nocturia, Nocturia - 12/30/2014 Calculus Ureter, Ureteral Stone - 2014    NON-GU PMH: Encounter for general adult medical examination without abnormal findings, Encounter for preventive health examination - 10/21/2015 Personal history of other diseases of the circulatory system, History of hypertension - 2014 Personal history of other endocrine, nutritional and metabolic  disease, History of hypercholesterolemia - 2014, History of diabetes mellitus, - 2014    FAMILY HISTORY: Acute Myocardial Infarction - Runs In Family Cancer - Runs In Family Hypertension - Runs In Family   SOCIAL HISTORY: None    Notes: Former smoker, Tobacco Use, Alcohol Use, Marital History - Currently Married, Occupation:   REVIEW OF SYSTEMS:    GU Review Male:   Patient reports frequent urination and get up at night to urinate. Patient denies hard to postpone urination, burning/ pain with urination, leakage of urine, stream starts and stops, trouble starting your stream, have to strain to urinate , erection problems, and penile pain.  Gastrointestinal (Upper):   Patient denies nausea, vomiting, and indigestion/ heartburn.  Gastrointestinal (Lower):   Patient denies diarrhea and constipation.  Constitutional:   Patient denies fever, night sweats, weight loss, and fatigue.  Skin:   Patient denies skin rash/ lesion and itching.  Eyes:   Patient denies blurred vision and double vision.  Ears/ Nose/ Throat:   Patient reports sinus problems. Patient denies sore throat.  Hematologic/Lymphatic:   Patient denies swollen glands and easy bruising.  Cardiovascular:   Patient denies leg swelling and chest pains.  Respiratory:   Patient denies cough and shortness of breath.  Endocrine:   Patient denies excessive thirst.  Musculoskeletal:   Patient denies back pain and joint pain.  Neurological:   Patient denies headaches and dizziness.  Psychologic:   Patient denies anxiety and depression.   VITAL SIGNS:  04/04/2016 12:32 PM  Weight 205 lb / 92.99 kg  Height 75.5 in / 191.77 cm  BP 126/68 mmHg  Pulse 54 /min  Temperature 97.6 F / 36 C  BMI 25.3 kg/m   MULTI-SYSTEM PHYSICAL EXAMINATION:    Constitutional: Well-nourished. No physical deformities. Normally developed. Good grooming.  Gastrointestinal: No mass, mild LLQ tenderness, no CVAT, no rigidity, non obese abdomen.      PAST DATA  REVIEWED:  Source Of History:  Patient  Urine Test Review:   Urinalysis  X-Ray Review: C.T. Stone Protocol: Reviewed Films. there are small left renal stones with mild hydronephrosis. He has a possible tiny LUVJ stone and there appears to be a possible bladder stone.     03/08/16 01/13/16 05/29/15 12/30/14 10/28/10 10/28/09 04/29/09 11/17/08  PSA  Total PSA 1.08 ng/dl 2.12 ng/dl 1.68 ng/dl 1.32  1.49  2.49  2.69  2.26     PROCEDURES:         C.T. Urogram - [id number]               Urinalysis Dipstick Dipstick Cont'd  Color: Yellow Bilirubin: Neg  Appearance: Clear Ketones: Neg  Specific Gravity: 1.010 Blood: Neg  pH: 7.0 Protein: Neg  Glucose: Neg Urobilinogen: 0.2    Nitrites: Neg    Leukocyte Esterase: Neg    ASSESSMENT:      ICD-10 Details  1 GU:   Kidney Stone - N20.0 Left  2   Calculus Ureter - N20.1 Right  3   Pelvic and perineal pain - R10.2 Worsening   PLAN:            Medications Stop Meds: Cinnamon  Discontinue: 04/04/2016  - Reason: The medication cycle was completed.  Cranberry  Discontinue: 04/04/2016  - Reason: The medication cycle was completed.            Orders X-Rays: C.T. Stone Protocol Without Contrast  X-Ray Notes: History:  Hematuria: Yes/No  Patient to see MD after exam: Yes/No  Previous exam: CT / IVP/ US/ KUB/ None  When:  Where:  Diabetic: Yes/ No  BUN/ Creatinine:  Date of last BUN Creatinine:  Weight in pounds:  Allergy- IV Contrast: Yes/ No  Conflicting diabetic meds: Yes/ No  Diabetic Meds:  Prior Authorization #: [id number]          Schedule Return Visit: Next Available Appointment - Schedule Surgery          Document Letter(s):  Created for Patient: Clinical Summary         Notes:   He has a possible 7x 56mm RUVJ stone without obstruction that could be causing his symptoms. The stone may have been present since before August.   I am going to get him set up for cystoscopy with bilateral RTG's and possible  right ureteroscopy and stenting. I have reviewed the risks of bleeding, infection, ureteral injury, need for a stent or secondary procedures, thrombotic events and anesthetic complications.   CC: Dr. Unk Pinto.

## 2016-04-19 ENCOUNTER — Encounter (HOSPITAL_BASED_OUTPATIENT_CLINIC_OR_DEPARTMENT_OTHER)
Admission: RE | Disposition: A | Discharge status: Home / Self Care | Payer: Self-pay | Source: Ambulatory Visit | Attending: Urology

## 2016-04-19 ENCOUNTER — Ambulatory Visit (HOSPITAL_BASED_OUTPATIENT_CLINIC_OR_DEPARTMENT_OTHER)
Admission: RE | Admit: 2016-04-19 | Discharge: 2016-04-19 | Disposition: A | Discharge status: Home / Self Care | Payer: PPO | Source: Ambulatory Visit | Attending: Urology | Admitting: Urology

## 2016-04-19 ENCOUNTER — Ambulatory Visit (HOSPITAL_BASED_OUTPATIENT_CLINIC_OR_DEPARTMENT_OTHER): Payer: PPO | Admitting: Anesthesiology

## 2016-04-19 ENCOUNTER — Encounter (HOSPITAL_BASED_OUTPATIENT_CLINIC_OR_DEPARTMENT_OTHER): Payer: Self-pay | Admitting: *Deleted

## 2016-04-19 ENCOUNTER — Ambulatory Visit (HOSPITAL_COMMUNITY): Payer: PPO

## 2016-04-19 DIAGNOSIS — K219 Gastro-esophageal reflux disease without esophagitis: Secondary | ICD-10-CM | POA: Diagnosis not present

## 2016-04-19 DIAGNOSIS — Z7982 Long term (current) use of aspirin: Secondary | ICD-10-CM | POA: Insufficient documentation

## 2016-04-19 DIAGNOSIS — N132 Hydronephrosis with renal and ureteral calculous obstruction: Secondary | ICD-10-CM | POA: Insufficient documentation

## 2016-04-19 DIAGNOSIS — E119 Type 2 diabetes mellitus without complications: Secondary | ICD-10-CM | POA: Insufficient documentation

## 2016-04-19 DIAGNOSIS — Z7984 Long term (current) use of oral hypoglycemic drugs: Secondary | ICD-10-CM | POA: Diagnosis not present

## 2016-04-19 DIAGNOSIS — I10 Essential (primary) hypertension: Secondary | ICD-10-CM | POA: Diagnosis not present

## 2016-04-19 DIAGNOSIS — N202 Calculus of kidney with calculus of ureter: Secondary | ICD-10-CM | POA: Diagnosis not present

## 2016-04-19 DIAGNOSIS — Z79899 Other long term (current) drug therapy: Secondary | ICD-10-CM | POA: Insufficient documentation

## 2016-04-19 DIAGNOSIS — E7800 Pure hypercholesterolemia, unspecified: Secondary | ICD-10-CM | POA: Diagnosis not present

## 2016-04-19 DIAGNOSIS — N400 Enlarged prostate without lower urinary tract symptoms: Secondary | ICD-10-CM | POA: Diagnosis not present

## 2016-04-19 DIAGNOSIS — Z87891 Personal history of nicotine dependence: Secondary | ICD-10-CM | POA: Insufficient documentation

## 2016-04-19 DIAGNOSIS — N201 Calculus of ureter: Secondary | ICD-10-CM | POA: Diagnosis not present

## 2016-04-19 DIAGNOSIS — R7303 Prediabetes: Secondary | ICD-10-CM | POA: Diagnosis not present

## 2016-04-19 DIAGNOSIS — N210 Calculus in bladder: Secondary | ICD-10-CM | POA: Diagnosis not present

## 2016-04-19 HISTORY — PX: CYSTOSCOPY WITH RETROGRADE PYELOGRAM, URETEROSCOPY AND STENT PLACEMENT: SHX5789

## 2016-04-19 HISTORY — DX: Personal history of other diseases of the musculoskeletal system and connective tissue: Z87.39

## 2016-04-19 HISTORY — DX: Nocturia: R35.1

## 2016-04-19 HISTORY — DX: Personal history of urinary calculi: Z87.442

## 2016-04-19 HISTORY — DX: Spontaneous ecchymoses: R23.3

## 2016-04-19 HISTORY — DX: Elevated prostate specific antigen (PSA): R97.20

## 2016-04-19 HISTORY — DX: Benign prostatic hyperplasia with lower urinary tract symptoms: N40.1

## 2016-04-19 HISTORY — DX: Presence of dental prosthetic device (complete) (partial): Z97.2

## 2016-04-19 HISTORY — DX: Other skin changes: R23.8

## 2016-04-19 LAB — POCT I-STAT 4, (NA,K, GLUC, HGB,HCT)
Glucose, Bld: 126 mg/dL — ABNORMAL HIGH (ref 65–99)
HEMATOCRIT: 43 % (ref 39.0–52.0)
HEMOGLOBIN: 14.6 g/dL (ref 13.0–17.0)
Potassium: 4.1 mmol/L (ref 3.5–5.1)
Sodium: 142 mmol/L (ref 135–145)

## 2016-04-19 SURGERY — CYSTOURETEROSCOPY, WITH RETROGRADE PYELOGRAM AND STENT INSERTION
Anesthesia: General | Site: Bladder | Laterality: Bilateral

## 2016-04-19 MED ORDER — FENTANYL CITRATE (PF) 100 MCG/2ML IJ SOLN
25.0000 ug | INTRAMUSCULAR | Status: DC | PRN
  Filled 2016-04-19: qty 1

## 2016-04-19 MED ORDER — EPHEDRINE 5 MG/ML INJ
INTRAVENOUS | Status: AC
  Filled 2016-04-19: qty 10

## 2016-04-19 MED ORDER — ONDANSETRON HCL 4 MG/2ML IJ SOLN
INTRAMUSCULAR | Status: DC | PRN
  Administered 2016-04-19: 4 mg via INTRAVENOUS

## 2016-04-19 MED ORDER — HYDROCODONE-ACETAMINOPHEN 5-325 MG PO TABS
ORAL_TABLET | ORAL | Status: AC
  Filled 2016-04-19: qty 1

## 2016-04-19 MED ORDER — EPHEDRINE SULFATE-NACL 50-0.9 MG/10ML-% IV SOSY
PREFILLED_SYRINGE | INTRAVENOUS | Status: DC | PRN
Start: 2016-04-19 — End: 2016-04-19
  Administered 2016-04-19 (×2): 5 mg via INTRAVENOUS
  Administered 2016-04-19: 10 mg via INTRAVENOUS

## 2016-04-19 MED ORDER — SODIUM CHLORIDE 0.9 % IR SOLN
Status: DC | PRN
  Administered 2016-04-19 (×2): 1

## 2016-04-19 MED ORDER — ONDANSETRON HCL 4 MG/2ML IJ SOLN
INTRAMUSCULAR | Status: AC
  Filled 2016-04-19: qty 2

## 2016-04-19 MED ORDER — PROPOFOL 10 MG/ML IV BOLUS
INTRAVENOUS | Status: AC
  Filled 2016-04-19: qty 20

## 2016-04-19 MED ORDER — ACETAMINOPHEN 650 MG RE SUPP
650.0000 mg | RECTAL | Status: DC | PRN
  Filled 2016-04-19: qty 1

## 2016-04-19 MED ORDER — LACTATED RINGERS IV SOLN
INTRAVENOUS | Status: DC
  Administered 2016-04-19 (×2): via INTRAVENOUS
  Filled 2016-04-19: qty 1000

## 2016-04-19 MED ORDER — SODIUM CHLORIDE 0.9% FLUSH
3.0000 mL | INTRAVENOUS | Status: DC | PRN
  Filled 2016-04-19: qty 3

## 2016-04-19 MED ORDER — FENTANYL CITRATE (PF) 100 MCG/2ML IJ SOLN
INTRAMUSCULAR | Status: DC | PRN
  Administered 2016-04-19: 25 ug via INTRAVENOUS

## 2016-04-19 MED ORDER — CEFAZOLIN SODIUM-DEXTROSE 2-4 GM/100ML-% IV SOLN
INTRAVENOUS | Status: AC
  Filled 2016-04-19: qty 100

## 2016-04-19 MED ORDER — CEFAZOLIN SODIUM-DEXTROSE 2-4 GM/100ML-% IV SOLN
2.0000 g | INTRAVENOUS | Status: AC
  Administered 2016-04-19: 2 g via INTRAVENOUS
  Filled 2016-04-19: qty 100

## 2016-04-19 MED ORDER — OXYCODONE HCL 5 MG PO TABS
5.0000 mg | ORAL_TABLET | ORAL | Status: DC | PRN
  Filled 2016-04-19: qty 2

## 2016-04-19 MED ORDER — IOHEXOL 300 MG/ML  SOLN
INTRAMUSCULAR | Status: DC | PRN
  Administered 2016-04-19: 20 mL

## 2016-04-19 MED ORDER — HYDROCODONE-ACETAMINOPHEN 5-325 MG PO TABS
1.0000 | ORAL_TABLET | Freq: Once | ORAL | Status: AC
  Administered 2016-04-19: 1 via ORAL
  Filled 2016-04-19: qty 1

## 2016-04-19 MED ORDER — LIDOCAINE 2% (20 MG/ML) 5 ML SYRINGE
INTRAMUSCULAR | Status: AC
  Filled 2016-04-19: qty 5

## 2016-04-19 MED ORDER — LIDOCAINE HCL (CARDIAC) 20 MG/ML IV SOLN
INTRAVENOUS | Status: DC | PRN
  Administered 2016-04-19: 50 mg via INTRAVENOUS

## 2016-04-19 MED ORDER — PROPOFOL 10 MG/ML IV BOLUS
INTRAVENOUS | Status: DC | PRN
  Administered 2016-04-19: 150 mg via INTRAVENOUS
  Administered 2016-04-19: 20 mg via INTRAVENOUS

## 2016-04-19 MED ORDER — DEXAMETHASONE SODIUM PHOSPHATE 10 MG/ML IJ SOLN
INTRAMUSCULAR | Status: AC
  Filled 2016-04-19: qty 1

## 2016-04-19 MED ORDER — ACETAMINOPHEN 325 MG PO TABS
650.0000 mg | ORAL_TABLET | ORAL | Status: DC | PRN
  Filled 2016-04-19: qty 2

## 2016-04-19 MED ORDER — SODIUM CHLORIDE 0.9 % IV SOLN
250.0000 mL | INTRAVENOUS | Status: DC | PRN
  Filled 2016-04-19: qty 250

## 2016-04-19 MED ORDER — FENTANYL CITRATE (PF) 100 MCG/2ML IJ SOLN
INTRAMUSCULAR | Status: AC
  Filled 2016-04-19: qty 2

## 2016-04-19 MED ORDER — DEXAMETHASONE SODIUM PHOSPHATE 4 MG/ML IJ SOLN
INTRAMUSCULAR | Status: DC | PRN
  Administered 2016-04-19: 5 mg via INTRAVENOUS

## 2016-04-19 MED ORDER — SODIUM CHLORIDE 0.9% FLUSH
3.0000 mL | Freq: Two times a day (BID) | INTRAVENOUS | Status: DC
  Filled 2016-04-19: qty 3

## 2016-04-19 MED ORDER — HYDROCODONE-ACETAMINOPHEN 5-325 MG PO TABS: 1.0000 | ORAL_TABLET | Freq: Four times a day (QID) | ORAL | 0 refills | Status: DC | PRN

## 2016-04-19 SURGICAL SUPPLY — 35 items
BAG DRAIN URO-CYSTO SKYTR STRL (DRAIN) ×3 IMPLANT
BAG DRN UROCATH (DRAIN) ×1
BASKET LASER NITINOL 1.9FR (BASKET) IMPLANT
BASKET ZERO TIP NITINOL 2.4FR (BASKET) ×2 IMPLANT
BSKT STON RTRVL 120 1.9FR (BASKET)
BSKT STON RTRVL ZERO TP 2.4FR (BASKET) ×1
CATH URET 5FR 28IN CONE TIP (BALLOONS)
CATH URET 5FR 28IN OPEN ENDED (CATHETERS) IMPLANT
CATH URET 5FR 70CM CONE TIP (BALLOONS) IMPLANT
CLOTH BEACON ORANGE TIMEOUT ST (SAFETY) ×3 IMPLANT
ELECT REM PT RETURN 9FT ADLT (ELECTROSURGICAL)
ELECTRODE REM PT RTRN 9FT ADLT (ELECTROSURGICAL) IMPLANT
FIBER LASER FLEXIVA 1000 (UROLOGICAL SUPPLIES) IMPLANT
FIBER LASER FLEXIVA 365 (UROLOGICAL SUPPLIES) IMPLANT
FIBER LASER FLEXIVA 550 (UROLOGICAL SUPPLIES) IMPLANT
FIBER LASER TRAC TIP (UROLOGICAL SUPPLIES) IMPLANT
GLOVE SURG SS PI 8.0 STRL IVOR (GLOVE) ×3 IMPLANT
GOWN STRL REUS W/ TWL LRG LVL3 (GOWN DISPOSABLE) IMPLANT
GOWN STRL REUS W/ TWL XL LVL3 (GOWN DISPOSABLE) ×1 IMPLANT
GOWN STRL REUS W/TWL LRG LVL3 (GOWN DISPOSABLE)
GOWN STRL REUS W/TWL XL LVL3 (GOWN DISPOSABLE) ×3
GUIDEWIRE 0.038 PTFE COATED (WIRE) IMPLANT
GUIDEWIRE ANG ZIPWIRE 038X150 (WIRE) ×2 IMPLANT
GUIDEWIRE STR DUAL SENSOR (WIRE) ×3 IMPLANT
IV NS IRRIG 3000ML ARTHROMATIC (IV SOLUTION) ×3 IMPLANT
KIT BALLIN UROMAX 15FX10 (LABEL) IMPLANT
KIT BALLN UROMAX 15FX4 (MISCELLANEOUS) IMPLANT
KIT BALLN UROMAX 26 75X4 (MISCELLANEOUS)
KIT ROOM TURNOVER WOR (KITS) ×3 IMPLANT
MANIFOLD NEPTUNE II (INSTRUMENTS) IMPLANT
PACK CYSTO (CUSTOM PROCEDURE TRAY) ×3 IMPLANT
SET HIGH PRES BAL DIL (LABEL)
SHEATH ACCESS URETERAL 38CM (SHEATH) ×2 IMPLANT
TUBE CONNECTING 12'X1/4 (SUCTIONS)
TUBE CONNECTING 12X1/4 (SUCTIONS) IMPLANT

## 2016-04-19 NOTE — Discharge Instructions (Addendum)
CYSTOSCOPY HOME CARE INSTRUCTIONS  Activity: Rest for the remainder of the day.  Do not drive or operate equipment today.  You may resume normal activities in one to two days as instructed by your physician.   Meals: Drink plenty of liquids and eat light foods such as gelatin or soup this evening.  You may return to a normal meal plan tomorrow.  Return to Work: You may return to work in one to two days or as instructed by your physician.  Special Instructions / Symptoms: Call your physician if any of these symptoms occur:   -persistent or heavy bleeding  -bleeding which continues after first few urination  -large blood clots that are difficult to pass  -urine stream diminishes or stops completely  -fever equal to or higher than 101 degrees Farenheit.  -cloudy urine with a strong, foul odor  -severe pain  Females should always wipe from front to back after elimination.  You may feel some burning pain when you urinate.  This should disappear with time.  Applying moist heat to the lower abdomen or a hot tub bath may help relieve the pain. \  Please bring your stone to the office for analysis.   Patient Signature:  ________________________________________________________  Nurse's Signature:  ________________________________________________________   Post Anesthesia Home Care Instructions  Activity: Get plenty of rest for the remainder of the day. A responsible adult should stay with you for 24 hours following the procedure.  For the next 24 hours, DO NOT: -Drive a car -Paediatric nurse -Drink alcoholic beverages -Take any medication unless instructed by your physician -Make any legal decisions or sign important papers.  Meals: Start with liquid foods such as gelatin or soup. Progress to regular foods as tolerated. Avoid greasy, spicy, heavy foods. If nausea and/or vomiting occur, drink only clear liquids until the nausea and/or vomiting subsides. Call your physician if  vomiting continues.  Special Instructions/Symptoms: Your throat may feel dry or sore from the anesthesia or the breathing tube placed in your throat during surgery. If this causes discomfort, gargle with warm salt water. The discomfort should disappear within 24 hours.  If you had a scopolamine patch placed behind your ear for the management of post- operative nausea and/or vomiting:  1. The medication in the patch is effective for 72 hours, after which it should be removed.  Wrap patch in a tissue and discard in the trash. Wash hands thoroughly with soap and water. 2. You may remove the patch earlier than 72 hours if you experience unpleasant side effects which may include dry mouth, dizziness or visual disturbances. 3. Avoid touching the patch. Wash your hands with soap and water after contact with the patch.

## 2016-04-19 NOTE — Transfer of Care (Signed)
Immediate Anesthesia Transfer of Care Note  Patient: Michael Waller  Procedure(s) Performed: Procedure(s): CYSTOSCOPY WITH BILATERAL RETROGRADE POSSIBLE URETEROSCOPY AND STENT PLACEMENT (Bilateral)  Patient Location: PACU  Anesthesia Type:General  Level of Consciousness: awake, alert  and oriented  Airway & Oxygen Therapy: Patient Spontanous Breathing and Patient connected to nasal cannula oxygen  Post-op Assessment: Report given to RN  Post vital signs: Reviewed and stable  Last Vitals: 138/67, 55, 15, 99% Vitals:   04/19/16 0746  BP: (!) 157/68  Pulse: 71  Resp: 16  Temp: 36.4 C    Last Pain:  Vitals:   04/19/16 0746  TempSrc: Oral      Patients Stated Pain Goal: 6 ([id number])  Complications: No apparent anesthesia complications

## 2016-04-19 NOTE — Anesthesia Postprocedure Evaluation (Signed)
Anesthesia Post Note  Patient: [name]  Procedure(s) Performed: Procedure(s) (LRB): CYSTOSCOPY WITH BILATERAL RETROGRADE POSSIBLE URETEROSCOPY AND STENT PLACEMENT (Bilateral)  Patient location during evaluation: PACU Anesthesia Type: General Level of consciousness: awake and alert Pain management: pain level controlled Vital Signs Assessment: post-procedure vital signs reviewed and stable Respiratory status: spontaneous breathing, nonlabored ventilation and respiratory function stable Cardiovascular status: blood pressure returned to baseline and stable Postop Assessment: no signs of nausea or vomiting Anesthetic complications: no    Last Vitals:  Vitals:   04/19/16 1045 04/19/16 1100  BP: 130/62 (!) 150/69  Pulse: (!) 52 (!) 54  Resp: 16 12  Temp:      Last Pain:  Vitals:   04/19/16 0746  TempSrc: Oral                 Dannon Nguyenthi,W. EDMOND

## 2016-04-19 NOTE — Interval H&P Note (Signed)
History and Physical Interval Note:  He still has some left sided pain.   04/19/2016 9:32 AM  Michael Waller  has presented today for surgery, with the diagnosis of POSSIBLE RIGHT UVJ STONE  The various methods of treatment have been discussed with the patient and family. After consideration of risks, benefits and other options for treatment, the patient has consented to  Procedure(s): CYSTOSCOPY WITH BILATERAL RETROGRADE POSSIBLE URETEROSCOPY AND STENT PLACEMENT (Bilateral) as a surgical intervention .  The patient's history has been reviewed, patient examined, no change in status, stable for surgery.  I have reviewed the patient's chart and labs.  Questions were answered to the patient's satisfaction.     Rogena Deupree J

## 2016-04-19 NOTE — Brief Op Note (Signed)
04/19/2016  10:22 AM  PATIENT:  Michael Waller  75 y.o. male  PRE-OPERATIVE DIAGNOSIS:  POSSIBLE RIGHT UVJ STONE AND LEFT FLANK PAIN  POST-OPERATIVE DIAGNOSIS:  RIGHT UVJ STONE  PROCEDURE:  Procedure(s): CYSTOSCOPY WITH BILATERAL RETROGRADES, RIGHT URETEROSCOPIC STONE EXTRACTION.  SURGEON:  Surgeon(s) and Role:    * Irine Seal, MD - Primary  PHYSICIAN ASSISTANT:   ASSISTANTS: none   ANESTHESIA:   general  EBL:  No intake/output data recorded.  BLOOD ADMINISTERED:none  DRAINS: none   LOCAL MEDICATIONS USED:  NONE  SPECIMEN:  Source of Specimen:  right ureteral stone  DISPOSITION OF SPECIMEN:  to patient to bring to office.   COUNTS:  YES  TOURNIQUET:  * No tourniquets in log *  DICTATION: .Other Dictation: Dictation Number [id number]  PLAN OF CARE: Discharge to home after PACU  PATIENT DISPOSITION:  PACU - hemodynamically stable.   Delay start of Pharmacological VTE agent (>24hrs) due to surgical blood loss or risk of bleeding: not applicable

## 2016-04-19 NOTE — Anesthesia Procedure Notes (Signed)
Procedure Name: LMA Insertion Date/Time: 04/19/2016 9:49 AM Performed by: Bethena Roys T Pre-anesthesia Checklist: Patient identified, Emergency Drugs available, Suction available and Patient being monitored Patient Re-evaluated:Patient Re-evaluated prior to inductionOxygen Delivery Method: Circle system utilized Preoxygenation: Pre-oxygenation with 100% oxygen Intubation Type: IV induction Ventilation: Mask ventilation without difficulty LMA: LMA inserted LMA Size: 5.0 Number of attempts: 1 Airway Equipment and Method: Bite block Placement Confirmation: positive ETCO2 Tube secured with: Tape Dental Injury: Teeth and Oropharynx as per pre-operative assessment

## 2016-04-19 NOTE — Anesthesia Preprocedure Evaluation (Addendum)
Anesthesia Evaluation  Patient identified by MRN, date of birth, ID band Patient awake    Reviewed: Allergy & Precautions, H&P , NPO status , Patient's Chart, lab work & pertinent test results, reviewed documented beta blocker date and time   Airway Mallampati: II  TM Distance: >3 FB Neck ROM: Full    Dental no notable dental hx. (+) Edentulous Upper, Partial Lower, Dental Advisory Given   Pulmonary neg pulmonary ROS, former smoker,    Pulmonary exam normal breath sounds clear to auscultation       Cardiovascular hypertension, Pt. on medications and Pt. on home beta blockers  Rhythm:Regular Rate:Normal     Neuro/Psych negative neurological ROS  negative psych ROS   GI/Hepatic Neg liver ROS, GERD  Medicated and Controlled,  Endo/Other  negative endocrine ROS  Renal/GU Renal disease  negative genitourinary   Musculoskeletal   Abdominal   Peds  Hematology negative hematology ROS (+)   Anesthesia Other Findings   Reproductive/Obstetrics negative OB ROS                            Anesthesia Physical Anesthesia Plan  ASA: II  Anesthesia Plan: General   Post-op Pain Management:    Induction: Intravenous  Airway Management Planned: LMA  Additional Equipment:   Intra-op Plan:   Post-operative Plan: Extubation in OR  Informed Consent: I have reviewed the patients History and Physical, chart, labs and discussed the procedure including the risks, benefits and alternatives for the proposed anesthesia with the patient or authorized representative who has indicated his/her understanding and acceptance.   Dental advisory given  Plan Discussed with: CRNA  Anesthesia Plan Comments:         Anesthesia Quick Evaluation

## 2016-04-20 ENCOUNTER — Encounter (HOSPITAL_BASED_OUTPATIENT_CLINIC_OR_DEPARTMENT_OTHER): Payer: Self-pay | Admitting: Urology

## 2016-04-20 NOTE — Op Note (Signed)
NAME:  ANEIL, ANGELINI NO.:  [id number]  MEDICAL RECORD NO.:  [id number]  LOCATION:                                 FACILITY:  PHYSICIAN:  Marshall Cork. Jeffie Pollock, M.D.    DATE OF BIRTH:  [birth year]  DATE OF PROCEDURE:  04/19/2016 DATE OF DISCHARGE:                              OPERATIVE REPORT   PROCEDURES: 1. Cystoscopy with bilateral retrograde pyelogram with interpretation. 2. Right ureteroscopic stone extraction.  PREOPERATIVE DIAGNOSIS:  Right distal ureteral stone and left flank pain.  POSTOPERATIVE DIAGNOSIS:  Right distal ureteral stone and left flank pain.  SURGEON:  Marshall Cork. Jeffie Pollock, M.D.  ANESTHESIA:  General.  SPECIMEN:  Stone.  DRAINS:  None.  BLOOD LOSS:  Minimal.  COMPLICATIONS:  None.  FINDINGS:  Normal left retrograde and 8-mm right distal ureteral stone.  INDICATIONS:  Mr. Bellerive is a 75 year old white male who was evaluated for left flank pain.  He was found to have a right distal ureteral stone that actually has been present for quite some time without obstruction. There has been mild hydro on the CT on the left with some small lower pole renal stones, but no obvious ureteral stone.  It was felt that cystoscopy, bilateral retrogrades and ureteroscopic stone extraction were indicated.  FINDINGS AND PROCEDURES:  He was taken to the operating room where he was given Ancef.  General anesthetic was induced.  He was placed in lithotomy position.  His perineum and genitalia were prepped with Betadine solution and he was draped in usual sterile fashion.  Cystoscopy was performed using the 23-French scope and 30-degree lens. Examination revealed a normal urethra.  The external sphincter was intact.  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  Examination of the bladder revealed moderate trabeculation.  No tumors were noted.  The left ureteral orifice was unremarkable effluxing clear urine.  The right ureteral  orifice was in the normal anatomic position.  The stone was visible within the orifice.  Once initial inspection was performed, a left retrograde pyelogram was performed using a 5-French open-ended catheter and contrast. Examination revealed a normal ureter and intrarenal collecting system with very prompt drainage after completion of retrograde, suggesting no residual obstruction.  No filling defects were noted.  The right retrograde pyelogram was then performed with a 5-French open- ended catheter and contrast.  This revealed that the stone in a slightly dilated distal ureter with a normal more proximal ureter and intrarenal collecting system.  Once retrograde pyelograms had been performed, a guidewire was placed to the right kidney and the cystoscope was removed.  A digital access sheath inner core was then used to dilate the distal ureter on the right.  A 6.5-French dual-lumen semi-rigid ureteroscope was then passed per urethra alongside the wire into the right ureter.  The stone was grasped with a Nitinol basket and removed intact.  Reinspection after removal of the stone revealed no need for stent.  The ureteroscope was then removed.  The access sheath was replaced over the wire to allow the wire to be removed without urethral injury.  The wire and inner core were removed, and the bladder was drained.  The  access sheath was removed. The patient's drapes were removed.  He was taken down from lithotomy position.  His anesthetic was reversed.  He was moved to the recovery room in stable condition.  There were no complications.     Marshall Cork. Jeffie Pollock, M.D.   ______________________________ Marshall Cork. Jeffie Pollock, M.D.    JJW/MEDQ  D:  04/19/2016  T:  04/20/2016  Job:  [id number]

## 2016-04-26 DIAGNOSIS — N201 Calculus of ureter: Secondary | ICD-10-CM | POA: Diagnosis not present

## 2016-06-06 DIAGNOSIS — H5213 Myopia, bilateral: Secondary | ICD-10-CM | POA: Diagnosis not present

## 2016-06-06 DIAGNOSIS — H52223 Regular astigmatism, bilateral: Secondary | ICD-10-CM | POA: Diagnosis not present

## 2016-06-06 DIAGNOSIS — E119 Type 2 diabetes mellitus without complications: Secondary | ICD-10-CM | POA: Diagnosis not present

## 2016-06-06 DIAGNOSIS — H524 Presbyopia: Secondary | ICD-10-CM | POA: Diagnosis not present

## 2016-06-20 ENCOUNTER — Ambulatory Visit (INDEPENDENT_AMBULATORY_CARE_PROVIDER_SITE_OTHER): Payer: PPO | Admitting: Internal Medicine

## 2016-06-20 VITALS — BP 130/72 | HR 60 | Temp 97.4°F | Resp 16 | Ht 75.5 in | Wt 201.8 lb

## 2016-06-20 DIAGNOSIS — Z0000 Encounter for general adult medical examination without abnormal findings: Secondary | ICD-10-CM

## 2016-06-20 DIAGNOSIS — M1000 Idiopathic gout, unspecified site: Secondary | ICD-10-CM | POA: Diagnosis not present

## 2016-06-20 DIAGNOSIS — E782 Mixed hyperlipidemia: Secondary | ICD-10-CM

## 2016-06-20 DIAGNOSIS — E559 Vitamin D deficiency, unspecified: Secondary | ICD-10-CM

## 2016-06-20 DIAGNOSIS — Z136 Encounter for screening for cardiovascular disorders: Secondary | ICD-10-CM | POA: Diagnosis not present

## 2016-06-20 DIAGNOSIS — R7303 Prediabetes: Secondary | ICD-10-CM

## 2016-06-20 DIAGNOSIS — Z79899 Other long term (current) drug therapy: Secondary | ICD-10-CM

## 2016-06-20 DIAGNOSIS — Z0001 Encounter for general adult medical examination with abnormal findings: Secondary | ICD-10-CM

## 2016-06-20 DIAGNOSIS — K219 Gastro-esophageal reflux disease without esophagitis: Secondary | ICD-10-CM

## 2016-06-20 DIAGNOSIS — I10 Essential (primary) hypertension: Secondary | ICD-10-CM

## 2016-06-20 DIAGNOSIS — Z1212 Encounter for screening for malignant neoplasm of rectum: Secondary | ICD-10-CM

## 2016-06-20 LAB — BASIC METABOLIC PANEL WITH GFR
BUN: 14 mg/dL (ref 7–25)
CALCIUM: 9.4 mg/dL (ref 8.6–10.3)
CO2: 29 mmol/L (ref 20–31)
Chloride: 102 mmol/L (ref 98–110)
Creat: 0.91 mg/dL (ref 0.70–1.18)
GFR, Est Non African American: 82 mL/min (ref >=60)
GLUCOSE: 102 mg/dL — AB (ref 65–99)
Potassium: 4.5 mmol/L (ref 3.5–5.3)
Sodium: 141 mmol/L (ref 135–146)

## 2016-06-20 LAB — CBC WITH DIFFERENTIAL/PLATELET
Basophils Absolute: 0 cells/uL (ref 0–200)
Basophils Relative: 0 %
Eosinophils Absolute: 66 cells/uL (ref 15–500)
Eosinophils Relative: 1 %
HCT: 45.3 % (ref 38.5–50.0)
Hemoglobin: 15.2 g/dL (ref 13.2–17.1)
LYMPHS PCT: 29 %
Lymphs Abs: 1914 cells/uL (ref 850–3900)
MCH: 31.5 pg (ref 27.0–33.0)
MCHC: 33.6 g/dL (ref 32.0–36.0)
MCV: 94 fL (ref 80.0–100.0)
MONO ABS: 462 {cells}/uL (ref 200–950)
MONOS PCT: 7 %
MPV: 10.3 fL (ref 7.5–12.5)
NEUTROS PCT: 63 %
Neutro Abs: 4158 cells/uL (ref 1500–7800)
PLATELETS: 191 10*3/uL (ref 140–400)
RBC: 4.82 MIL/uL (ref 4.20–5.80)
RDW: 13.5 % (ref 11.0–15.0)
WBC: 6.6 10*3/uL (ref 3.8–10.8)

## 2016-06-20 LAB — LIPID PANEL
CHOLESTEROL: 140 mg/dL (ref <200)
HDL: 53 mg/dL (ref >40)
LDL Cholesterol: 73 mg/dL (ref <100)
Total CHOL/HDL Ratio: 2.6 Ratio (ref <5.0)
Triglycerides: 68 mg/dL (ref <150)
VLDL: 14 mg/dL (ref <30)

## 2016-06-20 LAB — HEPATIC FUNCTION PANEL
ALBUMIN: 4.3 g/dL (ref 3.6–5.1)
ALT: 19 U/L (ref 9–46)
AST: 21 U/L (ref 10–35)
Alkaline Phosphatase: 63 U/L (ref 40–115)
BILIRUBIN TOTAL: 0.9 mg/dL (ref 0.2–1.2)
Bilirubin, Direct: 0.2 mg/dL (ref <=0.2)
Indirect Bilirubin: 0.7 mg/dL (ref 0.2–1.2)
TOTAL PROTEIN: 6.8 g/dL (ref 6.1–8.1)

## 2016-06-20 LAB — TSH: TSH: 1.3 mIU/L (ref 0.40–4.50)

## 2016-06-20 LAB — HEMOGLOBIN A1C
Hgb A1c MFr Bld: 5.4 % (ref <5.7)
MEAN PLASMA GLUCOSE: 108 mg/dL

## 2016-06-20 NOTE — Progress Notes (Signed)
[facility]   Unk Pinto, M.D.    Uvaldo Bristle. Silverio Lay, P.A.-C      Starlyn Skeans, P.A.-C  [facility]                [address] [place] [id number] Telephone [phone] Telefax [phone] Annual  Screening/Preventative Visit  & Comprehensive Evaluation & Examination     This very nice 76 y.o. [name] presents for a Screening/Preventative Visit & comprehensive evaluation and management of multiple medical co-morbidities.  Patient has been followed for HTN, Prediabetes, Hyperlipidemia and Vitamin D Deficiency. Patient recently had bilat retrogrades and uteroscopic stone extraction by Dr Jeffie Pollock in Nov 2017.      HTN predates since 48. Patient's BP has been controlled at home.  Today's BP is at goal - 130/72. Patient denies any cardiac symptoms as chest pain, palpitations, shortness of breath, dizziness or ankle swelling.     Patient's hyperlipidemia is controlled with diet and medications. Patient denies myalgias or other medication SE's. Last lipids were at goal: Lab Results  Component Value Date   CHOL 135 03/18/2016   HDL 52 03/18/2016   LDLCALC 71 03/18/2016   TRIG 58 03/18/2016   CHOLHDL 2.6 03/18/2016      Patient has prediabetes since 2006 with A1c 5.8% and patient denies reactive hypoglycemic symptoms, visual blurring, diabetic polys or paresthesias. Last A1c was at goal: Lab Results  Component Value Date   HGBA1C 5.4 03/18/2016       Finally, patient has history of Vitamin D Deficiency inn 2008  of "49" on supplements and last vitamin D was at goal: Lab Results  Component Value Date   VD25OH 86 03/18/2016   Current Outpatient Prescriptions on File Prior to Visit  Medication Sig  . Alfalfa 650 MG TABS Take 2 tablets by mouth daily.  Marland Kitchen allopurinol (ZYLOPRIM) 300 MG tablet Take 1 tablet (300 mg total) by mouth daily. (Patient taking differently: Take 150 mg by mouth daily with  lunch. )  . aspirin 81 MG tablet Take 81 mg by mouth daily.  Marland Kitchen atenolol (TENORMIN) 25 MG tablet Take 1 tablet (25 mg total) by mouth daily. (Patient taking differently: Take 25 mg by mouth every evening. )  . atorvastatin (LIPITOR) 40 MG tablet Take 1 tablet (40 mg total) by mouth at bedtime.  . Cholecalciferol (VITAMIN D3) 2000 units TABS Take 3 tablets by mouth daily.  . Cinnamon 500 MG TABS Take 2 tablets by mouth daily.  Marland Kitchen doxazosin (CARDURA) 8 MG tablet TAKE 1 TABLET BY MOUTH DAILY (Patient taking differently: TAKE 1 TABLET BY MOUTH DAILY--  takes 1/2 tablet at lunch)  . enalapril (VASOTEC) 20 MG tablet Take 1 tablet (20 mg total) by mouth daily. (Patient taking differently: Take 10 mg by mouth 2 (two) times daily. )  . finasteride (PROSCAR) 5 MG tablet Take 5 mg by mouth every evening.   . Garlic 123XX123 MG CAPS Take 1,000 mg by mouth daily.  Marland Kitchen HYDROcodone-acetaminophen (NORCO) 5-325 MG tablet Take 1 tablet by mouth every 6 (six) hours as needed for moderate pain.  . Magnesium 250 MG TABS Take 250 mg by mouth 2 (two) times daily. Breakfast and lunch  . Multiple Vitamin (MULTIVITAMIN) tablet Take 1 tablet by mouth daily.  . Omega-3 Fatty Acids (OMEGA-3 FISH OIL) 1200 MG CAPS Take 1 capsule by mouth daily with lunch.  Marland Kitchen  ranitidine (ZANTAC) 150 MG tablet Take 75 mg by mouth at bedtime.   No current facility-administered medications on file prior to visit.    Allergies  Allergen Reactions  . Adhesive [Tape] Other (See Comments)    reddness   Past Medical History:  Diagnosis Date  . Benign localized prostatic hyperplasia with lower urinary tract symptoms (LUTS)   . Bruises easily   . Elevated PSA   . GERD (gastroesophageal reflux disease)   . History of gout   . History of kidney stones   . Hyperlipidemia   . Hypertension   . Nephrolithiasis   . Nocturia   . Pre-diabetes   . Vitamin D deficiency   . Wears dentures    full upper and lower partial    Health Maintenance  Topic  Date Due  . ZOSTAVAX  11/04/2000  . TETANUS/TDAP  05/03/2016  . PNA vac Low Risk Adult (1 of 2 - PCV13) 11/02/2016 (Originally 11/04/2005)  . INFLUENZA VACCINE  03/30/2018 (Originally 12/22/2015)  . COLONOSCOPY  03/14/2017   Immunization History  Administered Date(s) Administered  . DTaP 08/31/2005  . PPD Test 03/17/2014   Past Surgical History:  Procedure Laterality Date  . CYSTOSCOPY WITH RETROGRADE PYELOGRAM, URETEROSCOPY AND STENT PLACEMENT Bilateral 04/19/2016   Procedure: CYSTOSCOPY WITH BILATERAL RETROGRADE URETEROSCOPY BASKET EXTRACTION;  Surgeon: Irine Seal, MD;  Location[facility];  Service: Urology;  Laterality: Bilateral;  . EXTRACORPOREAL SHOCK WAVE LITHOTRIPSY  [phone]  . HAND LIGAMENT RECONSTRUCTION Right 2009  . PROSTATE BIOPSY  2000, 12/2004   negative   Family History  Problem Relation Age of Onset  . Heart disease Mother   . Diabetes Mother   . Diabetes Father   . Heart disease Father   . Heart attack Father   . Diabetes Sister   . Diabetes Brother   . Diabetes Brother   . Diabetes Brother   . Heart disease Brother   . Stroke Brother   . Cancer Sister     breast  . Cancer Sister     colon   Social History   Social History  . Marital status: Married    Spouse name: N/A  . Number of children: N/A  . Years of education: N/A   Occupational History  . Retired   Social History Main Topics  . Smoking status: Former Smoker    Years: 5.00    Types: Cigarettes    Quit date: 05/23/1965  . Smokeless tobacco: Former Systems developer    Types: [place] date: 04/11/1966  . Alcohol use No  . Drug use: No  . Sexual activity: Not on file    ROS Constitutional: Denies fever, chills, weight loss/gain, headaches, insomnia,  night sweats or change in appetite. Does c/o fatigue. Eyes: Denies redness, blurred vision, diplopia, discharge, itchy or watery eyes.  ENT: Denies discharge, congestion, post nasal drip, epistaxis, sore throat, earache,  hearing loss, dental pain, Tinnitus, Vertigo, Sinus pain or snoring.  Cardio: Denies chest pain, palpitations, irregular heartbeat, syncope, dyspnea, diaphoresis, orthopnea, PND, claudication or edema Respiratory: denies cough, dyspnea, DOE, pleurisy, hoarseness, laryngitis or wheezing.  Gastrointestinal: Denies dysphagia, heartburn, reflux, water brash, pain, cramps, nausea, vomiting, bloating, diarrhea, constipation, hematemesis, melena, hematochezia, jaundice or hemorrhoids Genitourinary: Denies dysuria, frequency, urgency, nocturia, hesitancy, discharge, hematuria or flank pain Musculoskeletal: Denies arthralgia, myalgia, stiffness, Jt. Swelling, pain, limp or strain/sprain. Denies Falls. Skin: Denies puritis, rash, hives, warts, acne, eczema or change in skin lesion Neuro: No weakness, tremor,  incoordination, spasms, paresthesia or pain Psychiatric: Denies confusion, memory loss or sensory loss. Denies Depression. Endocrine: Denies change in weight, skin, hair change, nocturia, and paresthesia, diabetic polys, visual blurring or hyper / hypo glycemic episodes.  Heme/Lymph: No excessive bleeding, bruising or enlarged lymph nodes.  Physical Exam  BP 130/72   Pulse 60   Temp 97.4 F (36.3 C)   Resp 16   Ht 6' 3.5" (1.918 m)   Wt 201 lb 12.8 oz (91.5 kg)   BMI 24.89 kg/m   General Appearance: Well nourished, in no apparent distress.  Eyes: PERRLA, EOMs, conjunctiva no swelling or erythema, normal fundi and vessels. Sinuses: No frontal/maxillary tenderness ENT/Mouth: EACs patent / TMs  nl. Nares clear without erythema, swelling, mucoid exudates. Oral hygiene is good. No erythema, swelling, or exudate. Tongue normal, non-obstructing. Tonsils not swollen or erythematous. Hearing normal.  Neck: Supple, thyroid normal. No bruits, nodes or JVD. Respiratory: Respiratory effort normal.  BS equal and clear bilateral without rales, rhonci, wheezing or stridor. Cardio: Heart sounds are normal  with regular rate and rhythm and no murmurs, rubs or gallops. Peripheral pulses are normal and equal bilaterally without edema. No aortic or femoral bruits. Chest: symmetric with normal excursions and percussion.  Abdomen: Soft, with Nl bowel sounds. Nontender, no guarding, rebound, hernias, masses, or organomegaly.  Lymphatics: Non tender without lymphadenopathy.  Genitourinary: Deferred to Dr Jeffie Pollock. Musculoskeletal: Full ROM all peripheral extremities, joint stability, 5/5 strength, and normal gait. Skin: Warm and dry without rashes, lesions, cyanosis, clubbing or  ecchymosis.  Neuro: Cranial nerves intact, reflexes equal bilaterally. Normal muscle tone, no cerebellar symptoms. Sensation intact.  Pysch: Alert and oriented X 3 with normal affect, insight and judgment appropriate.   Assessment and Plan  1. Annual Preventative/Screening Exam   2. Essential hypertension  - Microalbumin / creatinine urine ratio - EKG 12-Lead - [place], RETROPERITNL ABD,  LTD - Urinalysis, Routine w reflex microscopic - CBC with Differential/Platelet - BASIC METABOLIC PANEL WITH GFR - TSH  3. Mixed hyperlipidemia  - EKG 12-Lead - [place], RETROPERITNL ABD,  LTD - Hepatic function panel - Lipid panel - TSH  4. Prediabetes  - EKG 12-Lead - [place], RETROPERITNL ABD,  LTD - Hemoglobin A1c - Insulin, random  5. Vitamin D deficiency  - VITAMIN D 25 Hydroxy   6. Screening for rectal cancer  - POC Hemoccult Bld/Stl   7. Screening for ischemic heart disease  - EKG 12-Lead  8. Screening for AAA (aortic abdominal aneurysm)  - [place], RETROPERITNL ABD,  LTD  9. Gastroesophageal reflux disease    10. Idiopathic gout  - Uric acid  11. Medication management  - Urinalysis, Routine w reflex microscopic - CBC with Differential/Platelet - BASIC METABOLIC PANEL WITH GFR - Hepatic function panel - Magnesium - Lipid panel - TSH - Hemoglobin A1c - Insulin, random - VITAMIN D 25 Hydroxy - Uric  acid       Continue prudent diet as discussed, weight control, BP monitoring, regular exercise, and medications as discussed.  Discussed med effects and SE's. Routine screening labs and tests as requested with regular follow-up as recommended. Over 40 minutes of exam, counseling, chart review and high complex critical decision making was performed

## 2016-06-20 NOTE — Patient Instructions (Signed)

## 2016-06-21 LAB — MICROALBUMIN / CREATININE URINE RATIO
CREATININE, URINE: 47 mg/dL (ref 20–370)
MICROALB UR: 0.3 mg/dL
MICROALB/CREAT RATIO: 6 ug/mg{creat} (ref <30)

## 2016-06-21 LAB — URINALYSIS, ROUTINE W REFLEX MICROSCOPIC
BILIRUBIN URINE: NEGATIVE
Glucose, UA: NEGATIVE
HGB URINE DIPSTICK: NEGATIVE
Ketones, ur: NEGATIVE
Leukocytes, UA: NEGATIVE
NITRITE: NEGATIVE
Protein, ur: NEGATIVE
Specific Gravity, Urine: 1.008 (ref 1.001–1.035)
pH: 7.5 (ref 5.0–8.0)

## 2016-06-21 LAB — URIC ACID: URIC ACID, SERUM: 5.9 mg/dL (ref 4.0–8.0)

## 2016-06-21 LAB — MAGNESIUM: MAGNESIUM: 2 mg/dL (ref 1.5–2.5)

## 2016-06-21 LAB — INSULIN, RANDOM: INSULIN: 4.2 u[IU]/mL (ref 2.0–19.6)

## 2016-06-21 LAB — VITAMIN D 25 HYDROXY (VIT D DEFICIENCY, FRACTURES): VIT D 25 HYDROXY: 88 ng/mL (ref 30–100)

## 2016-06-25 ENCOUNTER — Encounter: Payer: Self-pay | Admitting: Internal Medicine

## 2016-07-19 ENCOUNTER — Other Ambulatory Visit: Payer: Self-pay

## 2016-07-19 DIAGNOSIS — Z1212 Encounter for screening for malignant neoplasm of rectum: Secondary | ICD-10-CM

## 2016-07-19 LAB — POC HEMOCCULT BLD/STL (HOME/3-CARD/SCREEN)
Card #3 Fecal Occult Blood, POC: NEGATIVE
FECAL OCCULT BLD: NEGATIVE
FECAL OCCULT BLD: NEGATIVE

## 2016-07-23 ENCOUNTER — Other Ambulatory Visit: Payer: Self-pay | Admitting: Internal Medicine

## 2016-07-26 ENCOUNTER — Encounter: Payer: Self-pay | Admitting: *Deleted

## 2016-07-27 DIAGNOSIS — N401 Enlarged prostate with lower urinary tract symptoms: Secondary | ICD-10-CM | POA: Diagnosis not present

## 2016-07-27 DIAGNOSIS — R102 Pelvic and perineal pain: Secondary | ICD-10-CM | POA: Diagnosis not present

## 2016-07-27 DIAGNOSIS — R351 Nocturia: Secondary | ICD-10-CM | POA: Diagnosis not present

## 2016-07-28 ENCOUNTER — Telehealth: Payer: Self-pay | Admitting: *Deleted

## 2016-07-28 ENCOUNTER — Other Ambulatory Visit: Payer: Self-pay | Admitting: Internal Medicine

## 2016-07-28 NOTE — Telephone Encounter (Signed)
Patient called with elevated BP readings of 186/84 and 161/65 yesterday and 153/87 and 164/91 today.  Per Dr Melford Aase, continue the Atenolol 25 mg at 1 daily and change the Enalapril 20 mg to 1 whole tablet BID. Patient is aware and per Dr Melford Aase, keep a list of his BP and call back in 1 week or PRN.

## 2016-08-02 ENCOUNTER — Other Ambulatory Visit: Payer: Self-pay

## 2016-08-02 MED ORDER — GLUCOSE BLOOD VI STRP: ORAL_STRIP | 1 refills | Status: DC

## 2016-08-04 ENCOUNTER — Other Ambulatory Visit: Payer: Self-pay | Admitting: Internal Medicine

## 2016-08-04 ENCOUNTER — Telehealth: Payer: Self-pay | Admitting: *Deleted

## 2016-08-04 DIAGNOSIS — I10 Essential (primary) hypertension: Secondary | ICD-10-CM

## 2016-08-04 MED ORDER — BISOPROLOL-HYDROCHLOROTHIAZIDE 10-6.25 MG PO TABS: ORAL_TABLET | ORAL | 1 refills | Status: DC

## 2016-08-04 MED ORDER — ENALAPRIL MALEATE 20 MG PO TABS: ORAL_TABLET | ORAL | 1 refills | Status: DC

## 2016-08-04 NOTE — Telephone Encounter (Signed)
Patient sent a list of his BP's.  Per Dr Melford Aase, he should stop taking the Atenolol and continue taking Enalapril 20 mg BID.  A new RX was sent in for Bisoprolol 10 mg/HCTZ 6.25 mg, that the patient should take 1 tablet in the AM.  The patient is aware and will monitor his BP.

## 2016-08-22 ENCOUNTER — Telehealth: Payer: Self-pay | Admitting: *Deleted

## 2016-08-22 NOTE — Telephone Encounter (Signed)
Patient called and reported he had an episode of his stomach "rolling" on 08/20/2016 and felt shakey and felt like his pulse was increasing.  When he checked his pulse, it was in the 60's.  The patient asked about decreasing the Bisoprolol/HCTZ 10/6.25 mg 1/2 BID to 1/2 tablet daily.  Per Dr Melford Aase, continue the medication the same and he was informed the reason he took him off Atenolol was due to the low pulse.  The patient is aware and will continue to monitor his BP and pulse.

## 2016-09-12 ENCOUNTER — Other Ambulatory Visit: Payer: Self-pay | Admitting: *Deleted

## 2016-09-12 DIAGNOSIS — I10 Essential (primary) hypertension: Secondary | ICD-10-CM

## 2016-09-12 MED ORDER — ENALAPRIL MALEATE 20 MG PO TABS: ORAL_TABLET | ORAL | 1 refills | Status: DC

## 2016-09-15 ENCOUNTER — Other Ambulatory Visit: Payer: Self-pay | Admitting: Internal Medicine

## 2016-09-15 ENCOUNTER — Other Ambulatory Visit: Payer: Self-pay

## 2016-09-15 DIAGNOSIS — I10 Essential (primary) hypertension: Secondary | ICD-10-CM

## 2016-09-15 MED ORDER — BISOPROLOL-HYDROCHLOROTHIAZIDE 10-6.25 MG PO TABS: ORAL_TABLET | ORAL | 0 refills | Status: DC

## 2016-09-21 ENCOUNTER — Ambulatory Visit (INDEPENDENT_AMBULATORY_CARE_PROVIDER_SITE_OTHER): Payer: PPO | Admitting: Internal Medicine

## 2016-09-21 ENCOUNTER — Encounter: Payer: Self-pay | Admitting: Internal Medicine

## 2016-09-21 ENCOUNTER — Ambulatory Visit: Payer: Self-pay | Admitting: Physician Assistant

## 2016-09-21 VITALS — BP 118/60 | HR 52 | Temp 97.3°F | Resp 16 | Ht 75.5 in | Wt 200.4 lb

## 2016-09-21 DIAGNOSIS — E559 Vitamin D deficiency, unspecified: Secondary | ICD-10-CM

## 2016-09-21 DIAGNOSIS — M1000 Idiopathic gout, unspecified site: Secondary | ICD-10-CM

## 2016-09-21 DIAGNOSIS — Z79899 Other long term (current) drug therapy: Secondary | ICD-10-CM | POA: Diagnosis not present

## 2016-09-21 DIAGNOSIS — R7303 Prediabetes: Secondary | ICD-10-CM

## 2016-09-21 DIAGNOSIS — E782 Mixed hyperlipidemia: Secondary | ICD-10-CM | POA: Diagnosis not present

## 2016-09-21 DIAGNOSIS — I10 Essential (primary) hypertension: Secondary | ICD-10-CM

## 2016-09-21 LAB — CBC WITH DIFFERENTIAL/PLATELET
Basophils Absolute: 0 cells/uL (ref 0–200)
Basophils Relative: 0 %
Eosinophils Absolute: 74 cells/uL (ref 15–500)
Eosinophils Relative: 1 %
HEMATOCRIT: 38.6 % (ref 38.5–50.0)
Hemoglobin: 13.1 g/dL — ABNORMAL LOW (ref 13.2–17.1)
LYMPHS PCT: 22 %
Lymphs Abs: 1628 cells/uL (ref 850–3900)
MCH: 31.5 pg (ref 27.0–33.0)
MCHC: 33.9 g/dL (ref 32.0–36.0)
MCV: 92.8 fL (ref 80.0–100.0)
MONOS PCT: 9 %
MPV: 9.7 fL (ref 7.5–12.5)
Monocytes Absolute: 666 cells/uL (ref 200–950)
NEUTROS PCT: 68 %
Neutro Abs: 5032 cells/uL (ref 1500–7800)
PLATELETS: 191 10*3/uL (ref 140–400)
RBC: 4.16 MIL/uL — AB (ref 4.20–5.80)
RDW: 13.7 % (ref 11.0–15.0)
WBC: 7.4 10*3/uL (ref 3.8–10.8)

## 2016-09-21 LAB — TSH: TSH: 1.28 mIU/L (ref 0.40–4.50)

## 2016-09-21 NOTE — Patient Instructions (Signed)

## 2016-09-21 NOTE — Progress Notes (Signed)
This very nice 76 y.o. [name] presents for 6 month follow up with Hypertension, Hyperlipidemia, Pre-Diabetes and Vitamin D Deficiency. Patient also has hx/o Gout and GERD , both likewise controlled on their respective meds.      Patient is treated for HTN (1986) & BP has been controlled at home. Recently patient's BP's have been labile and elevated and most recent readings seem well controlled. Today's BP is at goal - 118/60. Patient has had no complaints of any cardiac type chest pain, palpitations, dyspnea / orthopnea / PND, dizziness, claudication, or dependent edema.     Hyperlipidemia is controlled with diet & meds. Patient denies myalgias or other med SE's. Last Lipids were at goal: Lab Results  Component Value Date   CHOL 140 06/20/2016   HDL 53 06/20/2016   LDLCALC 73 06/20/2016   TRIG 68 06/20/2016   CHOLHDL 2.6 06/20/2016      Also, the patient has history of PreDiabetes and has had no symptoms of reactive hypoglycemia, diabetic polys, paresthesias or visual blurring.  Last A1c was at goal: Lab Results  Component Value Date   HGBA1C 5.4 06/20/2016      Further, the patient also has history of Vitamin D Deficiency and supplements vitamin D without any suspected side-effects. Last vitamin D was at goal:  Lab Results  Component Value Date   VD25OH 88 06/20/2016   Current Outpatient Prescriptions on File Prior to Visit  Medication Sig  . Alfalfa 650 MG TABS Take 2 tablets by mouth daily.  Marland Kitchen allopurinol 300 MG tablet  Take 150 mg by mouth daily with lunch.   Marland Kitchen aspirin 81 MG tablet Take 81 mg by mouth daily.  Marland Kitchen atorvastatin 40 MG tablet Take 1 tablet (40 mg total) by mouth at bedtime.  . bisoprolol-hctz 10-6.25  Take 1 tablet every morning for BP  . VITAMIN D 2000 units TABS Take 3 tablets by mouth daily.  . Cinnamon 500 MG TABS Take 2 tablets by mouth daily.  Marland Kitchen doxazosin 8 MG tablet TAKE 1 TABLET BY MOUTH DAILY  . enalapril  20 MG tablet Take 1 tablet 2 x / day - am & pm for  BP  . finasteride  5 MG tablet Take 5 mg by mouth every evening.   . Garlic 5956 MG CAPS Take 1,000 mg by mouth daily.  Marland Kitchen glucose blood test strip Check blood sugar once daily  . Magnesium 250 MG TABS Take  2 (two) times daily.  . Multiple Vitamin  Take 1 tablet by mouth daily.  . Omega-3 FISH OIL 1200 MG  Take 1 capsule by mouth daily with lunch.  . ranitidine  150 MG tablet Take 75 mg by mouth at bedtime.   Allergies  Allergen Reactions  . Adhesive [Tape] Other (See Comments)    reddness   PMHx:   Past Medical History:  Diagnosis Date  . Benign localized prostatic hyperplasia with lower urinary tract symptoms (LUTS)   . Bruises easily   . Elevated PSA   . GERD (gastroesophageal reflux disease)   . History of gout   . History of kidney stones   . Hyperlipidemia   . Hypertension   . Nephrolithiasis   . Nocturia   . Pre-diabetes   . Vitamin D deficiency   . Wears dentures    full upper and lower partial    Immunization History  Administered Date(s) Administered  . DTaP 08/31/2005  . PPD Test 03/17/2014   Past Surgical  History:  Procedure Laterality Date  . CYSTOSCOPY WITH RETROGRADE PYELOGRAM, URETEROSCOPY AND STENT PLACEMENT Bilateral 04/19/2016   Procedure: CYSTOSCOPY WITH BILATERAL RETROGRADE URETEROSCOPY BASKET EXTRACTION;  Surgeon: Irine Seal, MD;  Location: [facility];  Service: Urology;  Laterality: Bilateral;  . EXTRACORPOREAL SHOCK WAVE LITHOTRIPSY  [phone]  . HAND LIGAMENT RECONSTRUCTION Right 2009  . PROSTATE BIOPSY  2000, 12/2004   negative   FHx:    Reviewed / unchanged  SHx:    Reviewed / unchanged  Systems Review:  Constitutional: Denies fever, chills, wt changes, headaches, insomnia, fatigue, night sweats, change in appetite. Eyes: Denies redness, blurred vision, diplopia, discharge, itchy, watery eyes.  ENT: Denies discharge, congestion, post nasal drip, epistaxis, sore throat, earache, hearing loss, dental pain, tinnitus,  vertigo, sinus pain, snoring.  CV: Denies chest pain, palpitations, irregular heartbeat, syncope, dyspnea, diaphoresis, orthopnea, PND, claudication or edema. Respiratory: denies cough, dyspnea, DOE, pleurisy, hoarseness, laryngitis, wheezing.  Gastrointestinal: Denies dysphagia, odynophagia, heartburn, reflux, water brash, abdominal pain or cramps, nausea, vomiting, bloating, diarrhea, constipation, hematemesis, melena, hematochezia  or hemorrhoids. Genitourinary: Denies dysuria, frequency, urgency, nocturia, hesitancy, discharge, hematuria or flank pain. Musculoskeletal: Denies arthralgias, myalgias, stiffness, jt. swelling, pain, limping or strain/sprain.  Skin: Denies pruritus, rash, hives, warts, acne, eczema or change in skin lesion(s). Neuro: No weakness, tremor, incoordination, spasms, paresthesia or pain. Psychiatric: Denies confusion, memory loss or sensory loss. Endo: Denies change in weight, skin or hair change.  Heme/Lymph: No excessive bleeding, bruising or enlarged lymph nodes.  Physical Exam  BP 118/60   Pulse (!) 52   Temp 97.3 F (36.3 C)   Resp 16   Ht 6' 3.5" (1.918 m)   Wt 200 lb 6.4 oz (90.9 kg)   BMI 24.72 kg/m   Appears well nourished, well groomed  and in no distress.  Eyes: PERRLA, EOMs, conjunctiva no swelling or erythema. Sinuses: No frontal/maxillary tenderness ENT/Mouth: EAC's clear, TM's nl w/o erythema, bulging. Nares clear w/o erythema, swelling, exudates. Oropharynx clear without erythema or exudates. Oral hygiene is good. Tongue normal, non obstructing. Hearing intact.  Neck: Supple. Thyroid nl. Car 2+/2+ without bruits, nodes or JVD. Chest: Respirations nl with BS clear & equal w/o rales, rhonchi, wheezing or stridor.  Cor: Heart sounds normal w/ regular rate and rhythm without sig. murmurs, gallops, clicks or rubs. Peripheral pulses normal and equal  without edema.  Abdomen: Soft & bowel sounds normal. Non-tender w/o guarding, rebound, hernias,  masses or organomegaly.  Lymphatics: Unremarkable.  Musculoskeletal: Full ROM all peripheral extremities, joint stability, 5/5 strength and normal gait.  Skin: Warm, dry without exposed rashes, lesions or ecchymosis apparent.  Neuro: Cranial nerves intact, reflexes equal bilaterally. Sensory-motor testing grossly intact. Tendon reflexes grossly intact.  Pysch: Alert & oriented x 3.  Insight and judgement nl & appropriate. No ideations.  Assessment and Plan:  - Continue medication, monitor blood pressure at home.  - Continue DASH diet. Reminder to go to the ER if any CP,  SOB, nausea, dizziness, severe HA, changes vision/speech,  left arm numbness and tingling and jaw pain.  - Continue diet/meds, exercise,& lifestyle modifications.  - Continue monitor periodic cholesterol/liver & renal functions   - Continue diet, exercise, lifestyle modifications.  - Monitor appropriate labs. - Continue supplementation.      Discussed  regular exercise, BP monitoring, weight control to achieve/maintain BMI less than 25 and discussed med and SE's. Recommended labs to assess and monitor clinical status with further disposition pending results of labs. Over 30  minutes of exam, counseling, chart review was performed.

## 2016-09-22 LAB — HEPATIC FUNCTION PANEL
ALK PHOS: 56 U/L (ref 40–115)
ALT: 20 U/L (ref 9–46)
AST: 26 U/L (ref 10–35)
Albumin: 3.9 g/dL (ref 3.6–5.1)
BILIRUBIN DIRECT: 0.1 mg/dL (ref <=0.2)
BILIRUBIN INDIRECT: 0.6 mg/dL (ref 0.2–1.2)
BILIRUBIN TOTAL: 0.7 mg/dL (ref 0.2–1.2)
Total Protein: 6.1 g/dL (ref 6.1–8.1)

## 2016-09-22 LAB — VITAMIN D 25 HYDROXY (VIT D DEFICIENCY, FRACTURES): Vit D, 25-Hydroxy: 79 ng/mL (ref 30–100)

## 2016-09-22 LAB — INSULIN, RANDOM: Insulin: 5.3 u[IU]/mL (ref 2.0–19.6)

## 2016-09-22 LAB — LIPID PANEL
Cholesterol: 143 mg/dL (ref <200)
HDL: 50 mg/dL (ref >40)
LDL CALC: 77 mg/dL (ref <100)
TRIGLYCERIDES: 79 mg/dL (ref <150)
Total CHOL/HDL Ratio: 2.9 Ratio (ref <5.0)
VLDL: 16 mg/dL (ref <30)

## 2016-09-22 LAB — BASIC METABOLIC PANEL WITH GFR
BUN: 17 mg/dL (ref 7–25)
CO2: 25 mmol/L (ref 20–31)
Calcium: 9.1 mg/dL (ref 8.6–10.3)
Chloride: 98 mmol/L (ref 98–110)
Creat: 0.82 mg/dL (ref 0.70–1.18)
GFR, EST NON AFRICAN AMERICAN: 87 mL/min (ref >=60)
Glucose, Bld: 103 mg/dL — ABNORMAL HIGH (ref 65–99)
POTASSIUM: 4.3 mmol/L (ref 3.5–5.3)
SODIUM: 136 mmol/L (ref 135–146)

## 2016-09-22 LAB — URIC ACID: URIC ACID, SERUM: 5.5 mg/dL (ref 4.0–8.0)

## 2016-09-22 LAB — HEMOGLOBIN A1C
Hgb A1c MFr Bld: 5.6 % (ref <5.7)
Mean Plasma Glucose: 114 mg/dL

## 2016-09-22 LAB — MAGNESIUM: Magnesium: 1.8 mg/dL (ref 1.5–2.5)

## 2016-09-26 ENCOUNTER — Other Ambulatory Visit: Payer: Self-pay | Admitting: Physician Assistant

## 2016-09-26 MED ORDER — GLUCOSE BLOOD VI STRP: ORAL_STRIP | 2 refills | Status: DC

## 2016-09-27 ENCOUNTER — Telehealth: Payer: Self-pay | Admitting: *Deleted

## 2016-09-27 MED ORDER — MINOXIDIL 2.5 MG PO TABS: ORAL_TABLET | ORAL | 1 refills | Status: DC

## 2016-09-27 NOTE — Telephone Encounter (Signed)
Patient brought his BP readings to the office.  The readings have been higher since he is only taking 1/2 Bisoprolo/HCTZ 10-6.25 mg in the mornings.  Also, the patient purchased a new BP monitor and the readings are higher.  Per Dr Melford Aase, add an Rx for Minoxidil 2.5 mg 1 tablet daily to his other medications. RX was sent to his pharmacy.

## 2016-10-11 ENCOUNTER — Telehealth: Payer: Self-pay | Admitting: *Deleted

## 2016-10-11 NOTE — Telephone Encounter (Signed)
Patient brought in a list of his blood pressures and a list of issues, such as blurred vision and burning sensation in his face, legs and arms.  Per Dr Melford Aase, the BP's are good and he should continue the Minoxidil and he does not think the vision problem Is caused by his BP medications.

## 2016-10-12 DIAGNOSIS — L821 Other seborrheic keratosis: Secondary | ICD-10-CM | POA: Diagnosis not present

## 2016-10-12 DIAGNOSIS — R238 Other skin changes: Secondary | ICD-10-CM | POA: Diagnosis not present

## 2016-10-12 DIAGNOSIS — L570 Actinic keratosis: Secondary | ICD-10-CM | POA: Diagnosis not present

## 2016-11-09 ENCOUNTER — Other Ambulatory Visit: Payer: Self-pay

## 2016-11-09 MED ORDER — ATORVASTATIN CALCIUM 40 MG PO TABS: 40.0000 mg | ORAL_TABLET | Freq: Every day | ORAL | 3 refills | Status: DC

## 2016-12-22 ENCOUNTER — Encounter: Payer: Self-pay | Admitting: Internal Medicine

## 2016-12-22 ENCOUNTER — Ambulatory Visit (INDEPENDENT_AMBULATORY_CARE_PROVIDER_SITE_OTHER): Payer: PPO | Admitting: Internal Medicine

## 2016-12-22 VITALS — BP 122/60 | HR 52 | Temp 97.1°F | Resp 16 | Ht 75.5 in | Wt 200.2 lb

## 2016-12-22 DIAGNOSIS — Z79899 Other long term (current) drug therapy: Secondary | ICD-10-CM

## 2016-12-22 DIAGNOSIS — E559 Vitamin D deficiency, unspecified: Secondary | ICD-10-CM | POA: Diagnosis not present

## 2016-12-22 DIAGNOSIS — R7303 Prediabetes: Secondary | ICD-10-CM | POA: Diagnosis not present

## 2016-12-22 DIAGNOSIS — I10 Essential (primary) hypertension: Secondary | ICD-10-CM

## 2016-12-22 DIAGNOSIS — E782 Mixed hyperlipidemia: Secondary | ICD-10-CM | POA: Diagnosis not present

## 2016-12-22 DIAGNOSIS — K219 Gastro-esophageal reflux disease without esophagitis: Secondary | ICD-10-CM | POA: Diagnosis not present

## 2016-12-22 DIAGNOSIS — M1000 Idiopathic gout, unspecified site: Secondary | ICD-10-CM | POA: Diagnosis not present

## 2016-12-22 LAB — CBC WITH DIFFERENTIAL/PLATELET
Basophils Absolute: 65 cells/uL (ref 0–200)
Basophils Relative: 1 %
EOS ABS: 130 {cells}/uL (ref 15–500)
Eosinophils Relative: 2 %
HEMATOCRIT: 39.7 % (ref 38.5–50.0)
HEMOGLOBIN: 13.2 g/dL (ref 13.2–17.1)
LYMPHS ABS: 1625 {cells}/uL (ref 850–3900)
LYMPHS PCT: 25 %
MCH: 31.7 pg (ref 27.0–33.0)
MCHC: 33.2 g/dL (ref 32.0–36.0)
MCV: 95.2 fL (ref 80.0–100.0)
MONO ABS: 520 {cells}/uL (ref 200–950)
MPV: 9.7 fL (ref 7.5–12.5)
Monocytes Relative: 8 %
NEUTROS PCT: 64 %
Neutro Abs: 4160 cells/uL (ref 1500–7800)
Platelets: 219 10*3/uL (ref 140–400)
RBC: 4.17 MIL/uL — ABNORMAL LOW (ref 4.20–5.80)
RDW: 13.4 % (ref 11.0–15.0)
WBC: 6.5 10*3/uL (ref 3.8–10.8)

## 2016-12-22 LAB — HEPATIC FUNCTION PANEL
ALK PHOS: 64 U/L (ref 40–115)
ALT: 20 U/L (ref 9–46)
AST: 23 U/L (ref 10–35)
Albumin: 4.4 g/dL (ref 3.6–5.1)
BILIRUBIN DIRECT: 0.2 mg/dL (ref <=0.2)
BILIRUBIN TOTAL: 0.7 mg/dL (ref 0.2–1.2)
Indirect Bilirubin: 0.5 mg/dL (ref 0.2–1.2)
Total Protein: 6.5 g/dL (ref 6.1–8.1)

## 2016-12-22 LAB — BASIC METABOLIC PANEL WITH GFR
BUN: 19 mg/dL (ref 7–25)
CO2: 25 mmol/L (ref 20–31)
Calcium: 9.7 mg/dL (ref 8.6–10.3)
Chloride: 102 mmol/L (ref 98–110)
Creat: 0.86 mg/dL (ref 0.70–1.18)
GFR, EST NON AFRICAN AMERICAN: 84 mL/min (ref >=60)
GFR, Est African American: >89 mL/min (ref >=60)
Glucose, Bld: 100 mg/dL — ABNORMAL HIGH (ref 65–99)
POTASSIUM: 4.5 mmol/L (ref 3.5–5.3)
Sodium: 140 mmol/L (ref 135–146)

## 2016-12-22 LAB — LIPID PANEL
CHOL/HDL RATIO: 2.4 ratio (ref <5.0)
Cholesterol: 148 mg/dL (ref <200)
HDL: 61 mg/dL (ref >40)
LDL CALC: 76 mg/dL (ref <100)
TRIGLYCERIDES: 53 mg/dL (ref <150)
VLDL: 11 mg/dL (ref <30)

## 2016-12-22 LAB — TSH: TSH: 1.19 m[IU]/L (ref 0.40–4.50)

## 2016-12-22 NOTE — Patient Instructions (Signed)

## 2016-12-22 NOTE — Progress Notes (Signed)
This very nice 76 y.o. [name] presents for 6 month follow up with Hypertension, Hyperlipidemia, Pre-Diabetes and Vitamin D Deficiency. Patient has hx/o Gout controlled on meds. His GERD is likewise controlled on meds.     Patient is treated for HTN since 1986 & BP has been recently labile with only a few mildly elevated BP's.  Patient presents with BP list with very numerous BP readings and since adding low dose Minoxidil , BP's seem leveled out. Today's BP is at goal - 122/60. Patient has had no complaints of any cardiac type chest pain, palpitations, dyspnea/orthopnea/PND, dizziness, claudication, or dependent edema.     Hyperlipidemia is controlled with diet & meds. Patient denies myalgias or other med SE's. Last Lipids were at goal: Lab Results  Component Value Date   CHOL 143 09/21/2016   HDL 50 09/21/2016   LDLCALC 77 09/21/2016   TRIG 79 09/21/2016   CHOLHDL 2.9 09/21/2016      Also, the patient has history of PreDiabetes (A1c 5.8% in 2006) and has had no symptoms of reactive hypoglycemia, diabetic polys, paresthesias or visual blurring.  Last A1c was  Lab Results  Component Value Date   HGBA1C 5.6 09/21/2016      Further, the patient also has history of Vitamin D Deficiency ("49" on treatment in 2008) and supplements vitamin D without any suspected side-effects. Last vitamin D was at goal:  Lab Results  Component Value Date   VD25OH 79 09/21/2016   Current Outpatient Prescriptions on File Prior to Visit  Medication Sig  . Alfalfa 650 MG TABS Take 2 tablets by mouth daily.  Marland Kitchen allopurinol (ZYLOPRIM) 300 MG tablet Take 1 tablet (300 mg total) by mouth daily. (Patient taking differently: Take 150 mg by mouth daily with lunch. )  . aspirin 81 MG tablet Take 81 mg by mouth daily.  Marland Kitchen atorvastatin (LIPITOR) 40 MG tablet Take 1 tablet (40 mg total) by mouth at bedtime.  . bisoprolol-hydrochlorothiazide (ZIAC) 10-6.25 MG tablet Take 1 tablet every morning for BP  . Cholecalciferol  (VITAMIN D3) 2000 units TABS Take 3 tablets by mouth daily.  . Cinnamon 500 MG TABS Take 2 tablets by mouth daily.  Marland Kitchen doxazosin (CARDURA) 8 MG tablet TAKE 1 TABLET BY MOUTH DAILY  . enalapril (VASOTEC) 20 MG tablet Take 1 tablet 2 x / day - am & pm for BP  . finasteride (PROSCAR) 5 MG tablet Take 5 mg by mouth every evening.   . Garlic 7209 MG CAPS Take 1,000 mg by mouth daily.  Marland Kitchen glucose blood test strip Check blood sugar once daily  . Magnesium 250 MG TABS Take 250 mg by mouth 2 (two) times daily. Breakfast and lunch  . minoxidil (LONITEN) 2.5 MG tablet Take 1 to 2 tablets in the AM for BP.  . Multiple Vitamin (MULTIVITAMIN) tablet Take 1 tablet by mouth daily.  . Omega-3 Fatty Acids (OMEGA-3 FISH OIL) 1200 MG CAPS Take 1 capsule by mouth daily with lunch.  . ranitidine (ZANTAC) 150 MG tablet Take 75 mg by mouth at bedtime.   No current facility-administered medications on file prior to visit.    Allergies  Allergen Reactions  . Adhesive [Tape] Other (See Comments)    reddness   PMHx:   Past Medical History:  Diagnosis Date  . Benign localized prostatic hyperplasia with lower urinary tract symptoms (LUTS)   . Bruises easily   . Elevated PSA   . GERD (gastroesophageal reflux disease)   .  History of gout   . History of kidney stones   . Hyperlipidemia   . Hypertension   . Nephrolithiasis   . Nocturia   . Pre-diabetes   . Vitamin D deficiency   . Wears dentures    full upper and lower partial    Immunization History  Administered Date(s) Administered  . DTaP 08/31/2005  . PPD Test 03/17/2014   Past Surgical History:  Procedure Laterality Date  . CYSTOSCOPY WITH RETROGRADE PYELOGRAM, URETEROSCOPY AND STENT PLACEMENT Bilateral 04/19/2016   Procedure: CYSTOSCOPY WITH BILATERAL RETROGRADE URETEROSCOPY BASKET EXTRACTION;  Surgeon: Irine Seal, MD;  Location: [facility];  Service: Urology;  Laterality: Bilateral;  . EXTRACORPOREAL SHOCK WAVE LITHOTRIPSY   [phone]  . HAND LIGAMENT RECONSTRUCTION Right 2009  . PROSTATE BIOPSY  2000, 12/2004   negative   FHx:    Reviewed / unchanged  SHx:    Reviewed / unchanged  Systems Review:  Constitutional: Denies fever, chills, wt changes, headaches, insomnia, fatigue, night sweats, change in appetite. Eyes: Denies redness, blurred vision, diplopia, discharge, itchy, watery eyes.  ENT: Denies discharge, congestion, post nasal drip, epistaxis, sore throat, earache, hearing loss, dental pain, tinnitus, vertigo, sinus pain, snoring.  CV: Denies chest pain, palpitations, irregular heartbeat, syncope, dyspnea, diaphoresis, orthopnea, PND, claudication or edema. Respiratory: denies cough, dyspnea, DOE, pleurisy, hoarseness, laryngitis, wheezing.  Gastrointestinal: Denies dysphagia, odynophagia, heartburn, reflux, water brash, abdominal pain or cramps, nausea, vomiting, bloating, diarrhea, constipation, hematemesis, melena, hematochezia  or hemorrhoids. Genitourinary: Denies dysuria, frequency, urgency, nocturia, hesitancy, discharge, hematuria or flank pain. Musculoskeletal: Denies arthralgias, myalgias, stiffness, jt. swelling, pain, limping or strain/sprain.  Skin: Denies pruritus, rash, hives, warts, acne, eczema or change in skin lesion(s). Neuro: No weakness, tremor, incoordination, spasms, paresthesia or pain. Psychiatric: Denies confusion, memory loss or sensory loss. Endo: Denies change in weight, skin or hair change.  Heme/Lymph: No excessive bleeding, bruising or enlarged lymph nodes.  Physical Exam  BP 122/60   Pulse (!) 52   Temp (!) 97.1 F (36.2 C)   Resp 16   Ht 6' 3.5" (1.918 m)   Wt 200 lb 3.2 oz (90.8 kg)   BMI 24.69 kg/m   Appears well nourished, well groomed  and in no distress.  Eyes: PERRLA, EOMs, conjunctiva no swelling or erythema. Sinuses: No frontal/maxillary tenderness ENT/Mouth: EAC's clear, TM's nl w/o erythema, bulging. Nares clear w/o erythema, swelling,  exudates. Oropharynx clear without erythema or exudates. Oral hygiene is good. Tongue normal, non obstructing. Hearing intact.  Neck: Supple. Thyroid nl. Car 2+/2+ without bruits, nodes or JVD. Chest: Respirations nl with BS clear & equal w/o rales, rhonchi, wheezing or stridor.  Cor: Heart sounds normal w/ regular rate and rhythm without sig. murmurs, gallops, clicks or rubs. Peripheral pulses normal and equal  without edema.  Abdomen: Soft & bowel sounds normal. Non-tender w/o guarding, rebound, hernias, masses or organomegaly.  Lymphatics: Unremarkable.  Musculoskeletal: Full ROM all peripheral extremities, joint stability, 5/5 strength and normal gait.  Skin: Warm, dry without exposed rashes, lesions or ecchymosis apparent.  Neuro: Cranial nerves intact, reflexes equal bilaterally. Sensory-motor testing grossly intact. Tendon reflexes grossly intact.  Pysch: Alert & oriented x 3.  Insight and judgement nl & appropriate. No ideations.  Assessment and Plan:  1. Essential hypertension  - Continue medication, monitor blood pressure at home.  - Continue DASH diet. Reminder to go to the ER if any CP,  SOB, nausea, dizziness, severe HA, changes vision/speech. - CBC with Differential/Platelet - BASIC  METABOLIC PANEL WITH GFR - Magnesium - TSH  2. Hyperlipidemia, mixed  - Continue diet/meds, exercise,& lifestyle modifications.  - Continue monitor periodic cholesterol/liver & renal functions  - Hepatic function panel - Lipid panel - TSH  3. Prediabetes  - Continue diet, exercise, lifestyle modifications.  - Monitor appropriate labs.  - Hemoglobin A1c - Insulin, random  4. Vitamin D deficiency  - Continue supplementation.  - VITAMIN D 25 Hydroxy   5. Gastroesophageal reflux disease  6. Idiopathic gout  - Uric acid  7. Medication management  - CBC with Differential/Platelet - BASIC METABOLIC PANEL WITH GFR - Hepatic function panel - Magnesium - Lipid panel - TSH -  Hemoglobin A1c - Insulin, random - VITAMIN D 25 Hydroxy - Uric acid      Discussed  regular exercise, BP monitoring, weight control to achieve/maintain BMI less than 25 and discussed med and SE's. Recommended labs to assess and monitor clinical status with further disposition pending results of labs. Over 30 minutes of exam, counseling, chart review was performed.

## 2016-12-23 LAB — HEMOGLOBIN A1C
Hgb A1c MFr Bld: 5.4 % (ref <5.7)
Mean Plasma Glucose: 108 mg/dL

## 2016-12-23 LAB — URIC ACID: Uric Acid, Serum: 6.2 mg/dL (ref 4.0–8.0)

## 2016-12-23 LAB — VITAMIN D 25 HYDROXY (VIT D DEFICIENCY, FRACTURES): VIT D 25 HYDROXY: 86 ng/mL (ref 30–100)

## 2016-12-23 LAB — INSULIN, RANDOM: Insulin: 8 u[IU]/mL (ref 2.0–19.6)

## 2016-12-23 LAB — MAGNESIUM: Magnesium: 1.9 mg/dL (ref 1.5–2.5)

## 2017-01-30 DIAGNOSIS — N401 Enlarged prostate with lower urinary tract symptoms: Secondary | ICD-10-CM | POA: Diagnosis not present

## 2017-02-06 DIAGNOSIS — N401 Enlarged prostate with lower urinary tract symptoms: Secondary | ICD-10-CM | POA: Diagnosis not present

## 2017-02-06 DIAGNOSIS — R972 Elevated prostate specific antigen [PSA]: Secondary | ICD-10-CM | POA: Diagnosis not present

## 2017-02-06 DIAGNOSIS — N200 Calculus of kidney: Secondary | ICD-10-CM | POA: Diagnosis not present

## 2017-02-06 DIAGNOSIS — R351 Nocturia: Secondary | ICD-10-CM | POA: Diagnosis not present

## 2017-02-21 DIAGNOSIS — D225 Melanocytic nevi of trunk: Secondary | ICD-10-CM | POA: Diagnosis not present

## 2017-02-21 DIAGNOSIS — L814 Other melanin hyperpigmentation: Secondary | ICD-10-CM | POA: Diagnosis not present

## 2017-02-21 DIAGNOSIS — L821 Other seborrheic keratosis: Secondary | ICD-10-CM | POA: Diagnosis not present

## 2017-02-21 DIAGNOSIS — Z23 Encounter for immunization: Secondary | ICD-10-CM | POA: Diagnosis not present

## 2017-02-21 DIAGNOSIS — D1801 Hemangioma of skin and subcutaneous tissue: Secondary | ICD-10-CM | POA: Diagnosis not present

## 2017-04-23 NOTE — Progress Notes (Signed)
MEDICARE ANNUAL WELLNESS VISIT AND FOLLOW UP Assessment:   Essential hypertension - continue medications, DASH diet, exercise and monitor at home. Call if greater than 130/80.  -     CBC with Differential/Platelet -     BASIC METABOLIC PANEL WITH GFR -     Hepatic function panel -     TSH  Mixed hyperlipidemia -continue medications, check lipids, decrease fatty foods, increase activity.  -     Lipid panel  Hyperuricemia Gout- recheck Uric acid as needed, Diet discussed, continue medications.  Medication management -     Magnesium  Vitamin D deficiency Continue supplement  Benign prostatic hyperplasia with lower urinary tract symptoms, symptom details unspecified Continue meds  Encounter for Medicare annual wellness exam 1 year Will discuss TDAP and cologuard at CPE  BMI 24.0-24.9, adult Monitor  Advanced care planning/counseling discussion Discussed in detail with patient, going to redo his will and will discuss with his lawyer at that time. Will at least discuss.  30 mins discussed   Over 30 minutes of exam, counseling, chart review, and critical decision making was performed  Plan:   During the course of the visit the patient was educated and counseled about appropriate screening and preventive services including:    Pneumococcal vaccine   Influenza vaccine  Prevnar 13  Td vaccine  Screening electrocardiogram  Colorectal cancer screening  Diabetes screening  Glaucoma screening  Nutrition counseling    Subjective:  Michael Waller is a 76 y.o. male who presents for Medicare Annual Wellness Visit and 3 month follow up for HTN, hyperlipidemia, prediabetes, and vitamin D Def.   His blood pressure has been controlled at home, today their BP is BP: 120/78 He does not workout. He denies chest pain, shortness of breath, dizziness.    He is on cholesterol medication and denies myalgias. His cholesterol is at goal. The cholesterol last visit was:   Lab  Results  Component Value Date   CHOL 148 12/22/2016   HDL 61 12/22/2016   LDLCALC 76 12/22/2016   TRIG 53 12/22/2016   CHOLHDL 2.4 12/22/2016   He has been working on diet and exercise for prediabetes, and denies foot ulcerations, hyperglycemia, hypoglycemia , increased appetite, nausea, paresthesia of the feet, polydipsia, polyuria, visual disturbances, vomiting and weight loss. Last A1C in the office was:  Lab Results  Component Value Date   HGBA1C 5.4 12/22/2016   Last GFR Lab Results  Component Value Date   GFRNONAA 84 12/22/2016    Patient is on Vitamin D supplement.   Lab Results  Component Value Date   VD25OH 86 12/22/2016     BMI is Body mass index is 24.57 kg/m., he is working on diet and exercise. Wt Readings from Last 3 Encounters:  04/24/17 199 lb 3.2 oz (90.4 kg)  12/22/16 200 lb 3.2 oz (90.8 kg)  09/21/16 200 lb 6.4 oz (90.9 kg)   Patient is on allopurinol for gout and does not report a recent flare.  Lab Results  Component Value Date   LABURIC 6.2 12/22/2016   He has BPH and is on proscar and cardura, has history of kidney stone and follows with [facility], Dr. Jeffie Pollock.  He has GERD, is on zantac.   Medication Review: Current Outpatient Medications on File Prior to Visit  Medication Sig Dispense Refill  . Alfalfa 650 MG TABS Take 2 tablets by mouth daily.    Marland Kitchen allopurinol (ZYLOPRIM) 300 MG tablet Take 1 tablet (300 mg total) by  mouth daily. (Patient taking differently: Take 150 mg by mouth daily with lunch. ) 90 tablet 4  . aspirin 81 MG tablet Take 81 mg by mouth daily.    Marland Kitchen atorvastatin (LIPITOR) 40 MG tablet Take 1 tablet (40 mg total) by mouth at bedtime. 30 tablet 3  . Cholecalciferol (VITAMIN D3) 2000 units TABS Take 3 tablets by mouth daily.    . Cinnamon 500 MG TABS Take 2 tablets by mouth daily.    Marland Kitchen doxazosin (CARDURA) 8 MG tablet TAKE 1 TABLET BY MOUTH DAILY 90 tablet 0  . finasteride (PROSCAR) 5 MG tablet Take 5 mg by mouth every evening.      . Garlic 7062 MG CAPS Take 1,000 mg by mouth daily.    Marland Kitchen glucose blood test strip Check blood sugar once daily 50 each 2  . Magnesium 250 MG TABS Take 250 mg by mouth 2 (two) times daily. Breakfast and lunch    . minoxidil (LONITEN) 2.5 MG tablet Take 1 to 2 tablets in the AM for BP. 180 tablet 1  . Multiple Vitamin (MULTIVITAMIN) tablet Take 1 tablet by mouth daily.    . Omega-3 Fatty Acids (OMEGA-3 FISH OIL) 1200 MG CAPS Take 1 capsule by mouth daily with lunch.    . ranitidine (ZANTAC) 150 MG tablet Take 75 mg by mouth at bedtime.    . bisoprolol-hydrochlorothiazide (ZIAC) 10-6.25 MG tablet Take 1 tablet every morning for BP 90 tablet 0  . enalapril (VASOTEC) 20 MG tablet Take 1 tablet 2 x / day - am & pm for BP 180 tablet 1   No current facility-administered medications on file prior to visit.     Current Problems (verified) Patient Active Problem List   Diagnosis Date Noted  . Medication management 11/15/2013  . Essential hypertension 05/22/2013  . Mixed hyperlipidemia 05/22/2013  . Vitamin D deficiency 05/22/2013  . Benign prostatic hyperplasia 05/22/2013  . Hyperuricemia 05/22/2013    Screening Tests Immunization History  Administered Date(s) Administered  . DTaP 08/31/2005  . PPD Test 03/17/2014    Preventative care: Last colonoscopy: 2008 Wants to wait until CPE to discuss with Dr. Melford Aase CXR 2014  Prior vaccinations: TD or Tdap: 2007 WANTS AT CPE Influenza: Declined  Pneumococcal: Declined Prevnar13: Declined Shingles/Zostavax: Declined  Names of Other Physician/Practitioners you currently use: 1. [facility] here for primary care 2. Dr. Sabra Heck, eye doctor, last visit 05/2016 3. Does not go regularly  Patient Care Team: Unk Pinto, MD as PCP - General (Internal Medicine) Renda Rolls, Jennefer Bravo, MD as Referring Physician (Dermatology) Gatha Mayer, MD as Consulting Physician (Gastroenterology) Irine Seal, MD as Attending Physician (Urology) Marica Otter, OD (Optometry)  Allergies Allergies  Allergen Reactions  . Adhesive [Tape] Other (See Comments)    reddness    SURGICAL HISTORY He  has a past surgical history that includes Prostate biopsy (2000, 12/2004); Extracorporeal shock wave lithotripsy [id number]); Hand ligament reconstruction (Right, 2009); and Cystoscopy with retrograde pyelogram, ureteroscopy and stent placement (Bilateral, 04/19/2016). FAMILY HISTORY His family history includes Cancer in his sister and sister; Diabetes in his brother, brother, brother, father, mother, and sister; Heart attack in his father; Heart disease in his brother, father, and mother; Stroke in his brother. SOCIAL HISTORY He  reports that he quit smoking about 51 years ago. His smoking use included cigarettes. He quit after 5.00 years of use. He quit smokeless tobacco use about 51 years ago. His smokeless tobacco use included chew. He  reports that he does not drink alcohol or use drugs.  MEDICARE WELLNESS OBJECTIVES: Physical activity: Current Exercise Habits: The patient does not participate in regular exercise at present(Has a garden and does yardwork) Cardiac risk factors: Cardiac Risk Factors include: advanced age (>88men, >94 women);male gender;smoking/ tobacco exposure;dyslipidemia Depression/mood screen:   Depression screen [facility] 2/9 04/24/2017  Decreased Interest 0  Down, Depressed, Hopeless 0  PHQ - 2 Score 0    ADLs:  In your present state of health, do you have any difficulty performing the following activities: 04/24/2017 12/22/2016  Hearing? N N  Vision? N N  Difficulty concentrating or making decisions? N N  Walking or climbing stairs? N N  Dressing or bathing? N N  Doing errands, shopping? N N  Some recent data might be hidden     Cognitive Testing  Alert? Yes  Normal Appearance?Yes  Oriented to person? Yes  Place? Yes   Time? Yes  Recall of three objects?  Yes  Can  perform simple calculations? Yes  Displays appropriate judgment?Yes  Can read the correct time from a watch face?Yes  EOL planning: Does Patient Have a Medical Advance Directive?: No Would patient like information on creating a medical advance directive?: No - Patient declined   Objective:   Today's Vitals   04/24/17 0834  BP: 120/78  Pulse: 64  Resp: 14  Temp: (!) 97.5 F (36.4 C)  SpO2: 98%  Weight: 199 lb 3.2 oz (90.4 kg)  Height: 6' 3.5" (1.918 m)   Body mass index is 24.57 kg/m.  General appearance: alert, no distress, WD/WN, male HEENT: normocephalic, sclerae anicteric, TMs pearly, nares patent, no discharge or erythema, pharynx normal Oral cavity: MMM, no lesions Neck: supple, no lymphadenopathy, no thyromegaly, no masses Heart: RRR, normal S1, S2, no murmurs Lungs: CTA bilaterally, no wheezes, rhonchi, or rales Abdomen: +bs, soft, non tender, non distended, no masses, no hepatomegaly, no splenomegaly Musculoskeletal: nontender, no swelling, no obvious deformity Extremities: no edema, no cyanosis, no clubbing Pulses: 2+ symmetric, upper and lower extremities, normal cap refill Neurological: alert, oriented x 3, CN2-12 intact, strength normal upper extremities and lower extremities, sensation normal throughout, DTRs 2+ throughout, no cerebellar signs, gait normal Psychiatric: normal affect, behavior normal, pleasant   Medicare Attestation I have personally reviewed: The patient's medical and social history Their use of alcohol, tobacco or illicit drugs Their current medications and supplements The patient's functional ability including ADLs,fall risks, home safety risks, cognitive, and hearing and visual impairment Diet and physical activities Evidence for depression or mood disorders  The patient's weight, height, BMI, and visual acuity have been recorded in the chart.  I have made referrals, counseling, and provided education to the patient based on review of the  above and I have provided the patient with a written personalized care plan for preventive services.     Vicie Mutters, PA-C   04/24/2017

## 2017-04-24 ENCOUNTER — Ambulatory Visit: Payer: PPO | Admitting: Physician Assistant

## 2017-04-24 ENCOUNTER — Encounter: Payer: Self-pay | Admitting: Physician Assistant

## 2017-04-24 VITALS — BP 120/78 | HR 64 | Temp 97.5°F | Resp 14 | Ht 75.5 in | Wt 199.2 lb

## 2017-04-24 DIAGNOSIS — I10 Essential (primary) hypertension: Secondary | ICD-10-CM | POA: Diagnosis not present

## 2017-04-24 DIAGNOSIS — Z0000 Encounter for general adult medical examination without abnormal findings: Secondary | ICD-10-CM

## 2017-04-24 DIAGNOSIS — Z79899 Other long term (current) drug therapy: Secondary | ICD-10-CM | POA: Diagnosis not present

## 2017-04-24 DIAGNOSIS — E782 Mixed hyperlipidemia: Secondary | ICD-10-CM

## 2017-04-24 DIAGNOSIS — R6889 Other general symptoms and signs: Secondary | ICD-10-CM

## 2017-04-24 DIAGNOSIS — Z0001 Encounter for general adult medical examination with abnormal findings: Secondary | ICD-10-CM | POA: Diagnosis not present

## 2017-04-24 DIAGNOSIS — N401 Enlarged prostate with lower urinary tract symptoms: Secondary | ICD-10-CM | POA: Diagnosis not present

## 2017-04-24 DIAGNOSIS — E790 Hyperuricemia without signs of inflammatory arthritis and tophaceous disease: Secondary | ICD-10-CM

## 2017-04-24 DIAGNOSIS — Z6824 Body mass index (BMI) 24.0-24.9, adult: Secondary | ICD-10-CM

## 2017-04-24 DIAGNOSIS — E559 Vitamin D deficiency, unspecified: Secondary | ICD-10-CM | POA: Diagnosis not present

## 2017-04-24 DIAGNOSIS — Z7189 Other specified counseling: Secondary | ICD-10-CM | POA: Diagnosis not present

## 2017-04-24 NOTE — Patient Instructions (Addendum)
VENOUS INSUFFICIENCY Our lower leg venous system is not the most reliable, the heart does NOT pump fluid up, there is a valve system.  The muscles of the leg squeeze and the blood moves up and a valve opens and close, then they squeeze, blood moves up and valves open and closes keeping the blood moving towards the heart.  Lots can go wrong with this valve system.  If someone is sitting or standing without movement, everyone will get swelling.  THINGS TO DO:  Do not stand or sit in one position for long periods of time. Do not sit with your legs crossed. Rest with your legs raised during the day.  Your legs have to be higher than your heart so that gravity will force the valves to open, so please really elevate your legs.   Wear elastic stockings or support hose. Do not wear other tight, encircling garments around the legs, pelvis, or waist.  [employer]  has a wide variety of well priced compression stockings. [place], [place] [address] [id number] [place] has a good cheap selection, I like the socks, they are not as hard to get on  Walk as much as possible to increase blood flow.  Raise the foot of your bed at night with 2-inch blocks.  SEEK MEDICAL CARE IF:   The skin around your ankle starts to break down.  You have pain, redness, tenderness, or hard swelling developing in your leg over a vein.  You are uncomfortable due to leg pain.  If you ever have shortness of breath with exertion or chest pain go to the ER.   10 Tips on Belching, Bloating, and Flatulence 1. Belching is caused by swallowed air from:  Eating or drinking too fast  Poorly fitting dentures; not chewing food completely  Carbonated beverages  Chewing gum or sucking on hard candies  Excessive swallowing due to nervous tension or postnasal drip  Forced belching to relieve abdominal discomfort 2. To prevent excessive belching, avoid:  Carbonated beverages  Chewing gum  Hard candies   Simethicone/GasX may be helpful  3. Abdominal bloating and discomfort may be due to intestinal sensitivity or symptoms of irritable bowel syndrome. To relieve symptoms, avoid:  Broccoli  Baked beans  Cabbage  Carbonated drinks  Cauliflower  Chewing gum  Hard candy 4. Abdominal distention resulting from weak abdominal muscles:  Is better in the morning  Gets worse as the day progresses  Is relieved by lying down 5. To prevent Abdominal distention:  Tighten abdominal muscles by pulling in your stomach several times during the day  Do sit-up exercises if possible  Wear an abdominal support garment if exercise is too difficult 6. Flatulence is gas created through bacterial action in the bowel and passed rectally. Keep in mind that:  10-18 passages per day are normal  Primary gases are harmless and odorless  Noticeable smells are trace gases related to food intake 7. Foods that are likely to form gas include:  Milk, dairy products, and medications that contain lactose--If your body doesn't produce the enzyme (lactase) to break it down.  Certain vegetables--baked beans, cauliflower, broccoli, cabbage  Certain starches--wheat, oats, corn, potatoes. Rice is a good substitute. 8. Identify offending foods. Reduce or eliminate these gas-forming foods from your diet.

## 2017-04-25 LAB — CBC WITH DIFFERENTIAL/PLATELET
BASOS ABS: 42 {cells}/uL (ref 0–200)
BASOS PCT: 0.7 %
EOS ABS: 108 {cells}/uL (ref 15–500)
Eosinophils Relative: 1.8 %
HCT: 40.6 % (ref 38.5–50.0)
HEMOGLOBIN: 14 g/dL (ref 13.2–17.1)
Lymphs Abs: 1548 cells/uL (ref 850–3900)
MCH: 31.7 pg (ref 27.0–33.0)
MCHC: 34.5 g/dL (ref 32.0–36.0)
MCV: 92.1 fL (ref 80.0–100.0)
MPV: 11 fL (ref 7.5–12.5)
Monocytes Relative: 8.3 %
Neutro Abs: 3804 cells/uL (ref 1500–7800)
Neutrophils Relative %: 63.4 %
Platelets: 146 10*3/uL (ref 140–400)
RBC: 4.41 10*6/uL (ref 4.20–5.80)
RDW: 12.7 % (ref 11.0–15.0)
TOTAL LYMPHOCYTE: 25.8 %
WBC: 6 10*3/uL (ref 3.8–10.8)
WBCMIX: 498 {cells}/uL (ref 200–950)

## 2017-04-25 LAB — BASIC METABOLIC PANEL WITH GFR
BUN: 15 mg/dL (ref 7–25)
CO2: 31 mmol/L (ref 20–32)
Calcium: 9.6 mg/dL (ref 8.6–10.3)
Chloride: 103 mmol/L (ref 98–110)
Creat: 0.93 mg/dL (ref 0.70–1.18)
GFR, EST AFRICAN AMERICAN: 92 mL/min/{1.73_m2} (ref >=60)
GFR, Est Non African American: 79 mL/min/{1.73_m2} (ref >=60)
Glucose, Bld: 108 mg/dL — ABNORMAL HIGH (ref 65–99)
Potassium: 4.5 mmol/L (ref 3.5–5.3)
SODIUM: 140 mmol/L (ref 135–146)

## 2017-04-25 LAB — LIPID PANEL
CHOLESTEROL: 133 mg/dL (ref <200)
HDL: 59 mg/dL (ref >40)
LDL Cholesterol (Calc): 61 mg/dL (calc)
Non-HDL Cholesterol (Calc): 74 mg/dL (calc) (ref <130)
Total CHOL/HDL Ratio: 2.3 (calc) (ref <5.0)
Triglycerides: 53 mg/dL (ref <150)

## 2017-04-25 LAB — HEPATIC FUNCTION PANEL
AG RATIO: 2.1 (calc) (ref 1.0–2.5)
ALT: 22 U/L (ref 9–46)
AST: 23 U/L (ref 10–35)
Albumin: 4.2 g/dL (ref 3.6–5.1)
Alkaline phosphatase (APISO): 63 U/L (ref 40–115)
BILIRUBIN DIRECT: 0.2 mg/dL (ref 0.0–0.2)
BILIRUBIN INDIRECT: 0.6 mg/dL (ref 0.2–1.2)
GLOBULIN: 2 g/dL (ref 1.9–3.7)
Total Bilirubin: 0.8 mg/dL (ref 0.2–1.2)
Total Protein: 6.2 g/dL (ref 6.1–8.1)

## 2017-04-25 LAB — MAGNESIUM: MAGNESIUM: 1.9 mg/dL (ref 1.5–2.5)

## 2017-04-25 LAB — TSH: TSH: 1.43 m[IU]/L (ref 0.40–4.50)

## 2017-05-12 ENCOUNTER — Other Ambulatory Visit: Payer: Self-pay | Admitting: Internal Medicine

## 2017-05-12 DIAGNOSIS — I10 Essential (primary) hypertension: Secondary | ICD-10-CM

## 2017-05-30 DIAGNOSIS — H524 Presbyopia: Secondary | ICD-10-CM | POA: Diagnosis not present

## 2017-05-30 DIAGNOSIS — H5213 Myopia, bilateral: Secondary | ICD-10-CM | POA: Diagnosis not present

## 2017-05-30 DIAGNOSIS — H2513 Age-related nuclear cataract, bilateral: Secondary | ICD-10-CM | POA: Diagnosis not present

## 2017-05-30 DIAGNOSIS — E119 Type 2 diabetes mellitus without complications: Secondary | ICD-10-CM | POA: Diagnosis not present

## 2017-05-30 LAB — HM DIABETES EYE EXAM

## 2017-06-07 ENCOUNTER — Encounter: Payer: Self-pay | Admitting: *Deleted

## 2017-06-09 ENCOUNTER — Other Ambulatory Visit: Payer: Self-pay | Admitting: Internal Medicine

## 2017-06-09 ENCOUNTER — Other Ambulatory Visit: Payer: Self-pay | Admitting: Physician Assistant

## 2017-07-05 ENCOUNTER — Other Ambulatory Visit: Payer: Self-pay | Admitting: Physician Assistant

## 2017-07-05 ENCOUNTER — Other Ambulatory Visit: Payer: Self-pay

## 2017-07-05 MED ORDER — BLOOD GLUCOSE METER KIT: PACK | 0 refills | Status: AC

## 2017-07-11 ENCOUNTER — Encounter: Payer: Self-pay | Admitting: Internal Medicine

## 2017-07-14 ENCOUNTER — Other Ambulatory Visit: Payer: Self-pay | Admitting: Physician Assistant

## 2017-07-26 ENCOUNTER — Encounter: Payer: Self-pay | Admitting: Internal Medicine

## 2017-07-26 ENCOUNTER — Ambulatory Visit (INDEPENDENT_AMBULATORY_CARE_PROVIDER_SITE_OTHER): Payer: PPO | Admitting: Internal Medicine

## 2017-07-26 VITALS — BP 132/64 | HR 56 | Temp 97.5°F | Resp 16 | Ht 75.5 in | Wt 201.2 lb

## 2017-07-26 DIAGNOSIS — R7303 Prediabetes: Secondary | ICD-10-CM | POA: Diagnosis not present

## 2017-07-26 DIAGNOSIS — Z87891 Personal history of nicotine dependence: Secondary | ICD-10-CM

## 2017-07-26 DIAGNOSIS — E559 Vitamin D deficiency, unspecified: Secondary | ICD-10-CM

## 2017-07-26 DIAGNOSIS — Z136 Encounter for screening for cardiovascular disorders: Secondary | ICD-10-CM

## 2017-07-26 DIAGNOSIS — Z79899 Other long term (current) drug therapy: Secondary | ICD-10-CM

## 2017-07-26 DIAGNOSIS — K219 Gastro-esophageal reflux disease without esophagitis: Secondary | ICD-10-CM

## 2017-07-26 DIAGNOSIS — Z23 Encounter for immunization: Secondary | ICD-10-CM

## 2017-07-26 DIAGNOSIS — Z0000 Encounter for general adult medical examination without abnormal findings: Secondary | ICD-10-CM

## 2017-07-26 DIAGNOSIS — Z1211 Encounter for screening for malignant neoplasm of colon: Secondary | ICD-10-CM

## 2017-07-26 DIAGNOSIS — E782 Mixed hyperlipidemia: Secondary | ICD-10-CM

## 2017-07-26 DIAGNOSIS — M1000 Idiopathic gout, unspecified site: Secondary | ICD-10-CM

## 2017-07-26 DIAGNOSIS — R7309 Other abnormal glucose: Secondary | ICD-10-CM | POA: Diagnosis not present

## 2017-07-26 DIAGNOSIS — I10 Essential (primary) hypertension: Secondary | ICD-10-CM | POA: Diagnosis not present

## 2017-07-26 DIAGNOSIS — Z0001 Encounter for general adult medical examination with abnormal findings: Secondary | ICD-10-CM

## 2017-07-26 DIAGNOSIS — Z1212 Encounter for screening for malignant neoplasm of rectum: Secondary | ICD-10-CM

## 2017-07-26 DIAGNOSIS — N401 Enlarged prostate with lower urinary tract symptoms: Secondary | ICD-10-CM

## 2017-07-26 DIAGNOSIS — Z8249 Family history of ischemic heart disease and other diseases of the circulatory system: Secondary | ICD-10-CM

## 2017-07-26 MED ORDER — RANITIDINE HCL 300 MG PO TABS: ORAL_TABLET | ORAL | 1 refills | Status: DC

## 2017-07-26 NOTE — Patient Instructions (Signed)

## 2017-07-26 NOTE — Progress Notes (Signed)
[facility]   Unk Pinto, M.D.     Uvaldo Bristle. Silverio Lay, P.A.-C Liane Comber, [place]                [address] [place] [address] Telephone [phone] Telefax [phone] Annual  Screening/Preventative Visit  & Comprehensive Evaluation & Examination     This very nice 77 y.o. [name]  presents for a Screening/Preventative Visit & comprehensive evaluation and management of multiple medical co-morbidities.  Patient has been followed for HTN, HLD, Prediabetes and Vitamin D Deficiency. Patient has hx/o GOUT  & GERD - both controlled with their respective meds.Patient also has hx/o BPH & LUTS controlled on Doxazosin and Finasteride. He's also followed by Dr Jeffie Pollock for Kidney stones & had Last basket extraction in Nov 2017 and also hs hx/o Lithotripsy(s).      HTN predates since 56. Patient's BP has been controlled at home.  Today's BP is at goal - 132/64. Patient denies any cardiac symptoms as chest pain, palpitations, shortness of breath, dizziness or ankle swelling.     Patient's hyperlipidemia is controlled with diet and medications. Patient denies myalgias or other medication SE's. Last lipids were at goal:  Lab Results  Component Value Date   CHOL 133 04/24/2017   HDL 59 04/24/2017   LDLCALC 76 12/22/2016   TRIG 53 04/24/2017   CHOLHDL 2.3 04/24/2017      Patient has prediabetes (A1c 5.8% /2006) and patient denies reactive hypoglycemic symptoms, visual blurring, diabetic polys or paresthesias. Last A1c was Normal & at goal: Lab Results  Component Value Date   HGBA1C 5.4 12/22/2016       Finally, patient has history of Vitamin D Deficiency  ("49" on Tx in 2008) and last vitamin D was at goal: Lab Results  Component Value Date   VD25OH 86 12/22/2016   Current Outpatient Medications on File Prior to Visit  Medication Sig  . Alfalfa 650 MG TABS Take 2 tablets by mouth daily.  Marland Kitchen  allopurinol (ZYLOPRIM) 300 MG tablet TAKE 1 TABLET BY MOUTH DAILY  . aspirin 81 MG tablet Take 81 mg by mouth daily.  Marland Kitchen atorvastatin (LIPITOR) 40 MG tablet TAKE 1 TABLET BY MOUTH EACH NIGHT AT BEDTIME  . blood glucose meter kit and supplies Dispense based insurance preference. E11.9  . Cholecalciferol (VITAMIN D3) 2000 units TABS Take 3 tablets by mouth daily.  . Cinnamon 500 MG TABS Take 2 tablets by mouth daily.  Marland Kitchen doxazosin (CARDURA) 8 MG tablet TAKE 1 TABLET BY MOUTH DAILY  . finasteride (PROSCAR) 5 MG tablet Take 5 mg by mouth every evening.   . Garlic 7939 MG CAPS Take 1,000 mg by mouth daily.  Marland Kitchen glucose blood (ONE TOUCH ULTRA TEST) test strip USE TO CHECK BLOOD SUGAR ONCE DAILY  . glucose blood test strip Check blood sugar once daily  . Magnesium 250 MG TABS Take 250 mg by mouth 2 (two) times daily. Breakfast and lunch  . minoxidil (LONITEN) 2.5 MG tablet Take 1 to 2 tablets in the AM for BP.  . Multiple Vitamin (MULTIVITAMIN) tablet Take 1 tablet by mouth daily.  . Omega-3 Fatty Acids (OMEGA-3 FISH OIL) 1200 MG CAPS Take 1 capsule by mouth daily with lunch.  . bisoprolol-hydrochlorothiazide (ZIAC) 10-6.25 MG tablet Take 1 tablet every morning for BP  . enalapril (VASOTEC) 20 MG tablet Take  1 tablet 2 x / day - am & pm for BP   No current facility-administered medications on file prior to visit.    Allergies  Allergen Reactions  . Adhesive [Tape] Other (See Comments)    reddness   Past Medical History:  Diagnosis Date  . Benign localized prostatic hyperplasia with lower urinary tract symptoms (LUTS)   . Bruises easily   . Elevated PSA   . GERD (gastroesophageal reflux disease)   . History of gout   . History of kidney stones   . Hyperlipidemia   . Hypertension   . Nephrolithiasis   . Nocturia   . Pre-diabetes   . Vitamin D deficiency   . Wears dentures    full upper and lower partial    Health Maintenance  Topic Date Due  . TETANUS/TDAP  05/03/2016  . INFLUENZA  VACCINE  Discontinued  . PNA vac Low Risk Adult  Discontinued   Immunization History  Administered Date(s) Administered  . DTaP 08/31/2005  . PPD Test 03/17/2014  . Td 07/26/2017   Last Colon -  Past Surgical History:  Procedure Laterality Date  . CYSTOSCOPY WITH RETROGRADE PYELOGRAM, URETEROSCOPY AND STENT PLACEMENT Bilateral 04/19/2016   Procedure: CYSTOSCOPY WITH BILATERAL RETROGRADE URETEROSCOPY BASKET EXTRACTION;  Surgeon: Irine Seal, MD;  Location: [facility];  Service: Urology;  Laterality: Bilateral;  . EXTRACORPOREAL SHOCK WAVE LITHOTRIPSY  [phone]  . HAND LIGAMENT RECONSTRUCTION Right 2009  . PROSTATE BIOPSY  2000, 12/2004   negative   Family History  Problem Relation Age of Onset  . Heart disease Mother   . Diabetes Mother   . Diabetes Father   . Heart disease Father   . Heart attack Father   . Diabetes Sister   . Diabetes Brother   . Diabetes Brother   . Diabetes Brother   . Heart disease Brother   . Stroke Brother   . Cancer Sister        breast  . Cancer Sister        colon   Social History   Socioeconomic History  . Marital status: Married    Spouse name: Enid Derry   . Number of children: None  Occupational History  . Retired 2007 from [employer]  Tobacco Use  . Smoking status: Former Smoker    Years: 5.00    Types: Cigarettes    Last attempt to quit: 05/23/1965    Years since quitting: 52.2  . Smokeless tobacco: Former Systems developer    Types: Delta date: 04/11/1966  Substance and Sexual Activity  . Alcohol use: No  . Drug use: No  . Sexual activity: Not on file    ROS Constitutional: Denies fever, chills, weight loss/gain, headaches, insomnia,  night sweats or change in appetite. Does c/o fatigue. Eyes: Denies redness, blurred vision, diplopia, discharge, itchy or watery eyes.  ENT: Denies discharge, congestion, post nasal drip, epistaxis, sore throat, earache, hearing loss, dental pain, Tinnitus, Vertigo,  Sinus pain or snoring.  Cardio: Denies chest pain, palpitations, irregular heartbeat, syncope, dyspnea, diaphoresis, orthopnea, PND, claudication or edema Respiratory: denies cough, dyspnea, DOE, pleurisy, hoarseness, laryngitis or wheezing.  Gastrointestinal: Denies dysphagia, heartburn, reflux, water brash, pain, cramps, nausea, vomiting, bloating, diarrhea, constipation, hematemesis, melena, hematochezia, jaundice or hemorrhoids Genitourinary: Denies dysuria,  discharge, hematuria or flank pain. Has sx's of frequency, urgency, nocturia x 2-3 &  Hesitancy being addressed by Dr Jeffie Pollock. Musculoskeletal: Denies arthralgia, myalgia, stiffness, Jt. Swelling, pain, limp or  strain/sprain. Denies Falls. Skin: Denies puritis, rash, hives, warts, acne, eczema or change in skin lesion Neuro: No weakness, tremor, incoordination, spasms, paresthesia or pain Psychiatric: Denies confusion, memory loss or sensory loss. Denies Depression. Endocrine: Denies change in weight, skin, hair change, nocturia, and paresthesia, diabetic polys, visual blurring or hyper / hypo glycemic episodes.  Heme/Lymph: No excessive bleeding, bruising or enlarged lymph nodes.  Physical Exam  BP 132/64   Pulse (!) 56   Temp (!) 97.5 F (36.4 C)   Resp 16   Ht 6' 3.5" (1.918 m)   Wt 201 lb 3.2 oz (91.3 kg)   BMI 24.82 kg/m   General Appearance: Well nourished and well groomed and in no apparent distress.  Eyes: PERRLA, EOMs, conjunctiva no swelling or erythema, normal fundi and vessels. Sinuses: No frontal/maxillary tenderness ENT/Mouth: EACs patent / TMs  nl. Nares clear without erythema, swelling, mucoid exudates. Oral hygiene is good. No erythema, swelling, or exudate. Tongue normal, non-obstructing. Tonsils not swollen or erythematous. Hearing normal.  Neck: Supple, thyroid not palpable. No bruits, nodes or JVD. Respiratory: Respiratory effort normal.  BS equal and clear bilateral without rales, rhonci, wheezing or  stridor. Cardio: Heart sounds are normal with regular rate and rhythm and no murmurs, rubs or gallops. Peripheral pulses are normal and equal bilaterally without edema. No aortic or femoral bruits. Chest: symmetric with normal excursions and percussion.  Abdomen: Soft, with Nl bowel sounds. Nontender, no guarding, rebound, hernias, masses, or organomegaly.  Lymphatics: Non tender without lymphadenopathy.  Genitourinary: Deferred to Dr Jeffie Pollock. Musculoskeletal: Full ROM all peripheral extremities, joint stability, 5/5 strength, and normal gait. Skin: Warm and dry without rashes, lesions, cyanosis, clubbing or  ecchymosis.  Neuro: Cranial nerves intact, reflexes equal bilaterally. Normal muscle tone, no cerebellar symptoms. Sensation intact.  Pysch: Alert and oriented X 3 with normal affect, insight and judgment appropriate.   Assessment and Plan  1. Annual Preventative/Screening Exam   2. Essential hypertension  - EKG 12-Lead - [place], RETROPERITNL ABD,  LTD - Urinalysis, Routine w reflex microscopic - Microalbumin / creatinine urine ratio - CBC with Differential/Platelet - BASIC METABOLIC PANEL WITH GFR - Magnesium - TSH  3. Hyperlipidemia, mixed  - EKG 12-Lead - [place], RETROPERITNL ABD,  LTD - Hepatic function panel - Lipid panel - TSH  4. Abnormal glucose  - Hemoglobin A1c - Insulin, random  5. Vitamin D deficiency  - VITAMIN D 25 Hydroxyl  6. Prediabetes  - Hemoglobin A1c - Insulin, random  7. Gastroesophageal reflux disease without esophagitis  - CBC with Differential/Platelet - ranitidine (ZANTAC) 300 MG tablet; Take 1 to 2 tablets daily for heartburn & reflux  Dispense: 180 tablet; Refill: 1  8. Idiopathic gout  - Uric acid  9. Benign localized prostatic hyperplasia with lower urinary tract symptoms (LUTS)   10. Encounter for colorectal cancer screening  - Ambulatory referral to Gastroenterology  11. Screening for ischemic heart disease  - EKG  12-Lead  12. FHx: heart disease  - EKG 12-Lead - [place], RETROPERITNL ABD,  LTD  13. Former smoker  - EKG 12-Lead - [place], RETROPERITNL ABD,  LTD  14. Screening for AAA (aortic abdominal aneurysm)  - [place], RETROPERITNL ABD,  LTD  15. Medication management  - Urinalysis, Routine w reflex microscopic - Microalbumin / creatinine urine ratio - Uric acid - CBC with Differential/Platelet - BASIC METABOLIC PANEL WITH GFR - Hepatic function panel - Magnesium - Lipid panel - TSH - Hemoglobin A1c - Insulin, random - VITAMIN  D 25 Hydroxyl  16. Need for prophylactic vaccination with tetanus-diphtheria (Td)  - Td : Tetanus/diphtheria >7yo Preservative  free       Patient was counseled in prudent diet, weight control to achieve/maintain BMI less than 25, BP monitoring, regular exercise and medications as discussed.  Discussed med effects and SE's. Routine screening labs and tests as requested with regular follow-up as recommended. Over 40 minutes of exam, counseling, chart review and high complex critical decision making was performed

## 2017-07-27 LAB — MICROALBUMIN / CREATININE URINE RATIO
Creatinine, Urine: 49 mg/dL (ref 20–320)
MICROALB UR: 0.5 mg/dL
Microalb Creat Ratio: 10 mcg/mg creat (ref <30)

## 2017-07-27 LAB — HEPATIC FUNCTION PANEL
AG RATIO: 2 (calc) (ref 1.0–2.5)
ALKALINE PHOSPHATASE (APISO): 66 U/L (ref 40–115)
ALT: 22 U/L (ref 9–46)
AST: 21 U/L (ref 10–35)
Albumin: 4.8 g/dL (ref 3.6–5.1)
BILIRUBIN DIRECT: 0.2 mg/dL (ref 0.0–0.2)
BILIRUBIN INDIRECT: 0.8 mg/dL (ref 0.2–1.2)
Globulin: 2.4 g/dL (calc) (ref 1.9–3.7)
Total Bilirubin: 1 mg/dL (ref 0.2–1.2)
Total Protein: 7.2 g/dL (ref 6.1–8.1)

## 2017-07-27 LAB — TSH: TSH: 1.18 mIU/L (ref 0.40–4.50)

## 2017-07-27 LAB — CBC WITH DIFFERENTIAL/PLATELET
BASOS ABS: 31 {cells}/uL (ref 0–200)
Basophils Relative: 0.4 %
EOS ABS: 62 {cells}/uL (ref 15–500)
Eosinophils Relative: 0.8 %
HEMATOCRIT: 45.6 % (ref 38.5–50.0)
HEMOGLOBIN: 15.7 g/dL (ref 13.2–17.1)
LYMPHS ABS: 1394 {cells}/uL (ref 850–3900)
MCH: 31.8 pg (ref 27.0–33.0)
MCHC: 34.4 g/dL (ref 32.0–36.0)
MCV: 92.3 fL (ref 80.0–100.0)
MPV: 10.4 fL (ref 7.5–12.5)
Monocytes Relative: 6.7 %
NEUTROS ABS: 5698 {cells}/uL (ref 1500–7800)
NEUTROS PCT: 74 %
Platelets: 204 10*3/uL (ref 140–400)
RBC: 4.94 10*6/uL (ref 4.20–5.80)
RDW: 12.7 % (ref 11.0–15.0)
Total Lymphocyte: 18.1 %
WBC mixed population: 516 cells/uL (ref 200–950)
WBC: 7.7 10*3/uL (ref 3.8–10.8)

## 2017-07-27 LAB — LIPID PANEL
CHOL/HDL RATIO: 2.6 (calc) (ref <5.0)
Cholesterol: 164 mg/dL (ref <200)
HDL: 63 mg/dL (ref >40)
LDL Cholesterol (Calc): 83 mg/dL (calc)
Non-HDL Cholesterol (Calc): 101 mg/dL (calc) (ref <130)
Triglycerides: 89 mg/dL (ref <150)

## 2017-07-27 LAB — URINALYSIS, ROUTINE W REFLEX MICROSCOPIC
BILIRUBIN URINE: NEGATIVE
Glucose, UA: NEGATIVE
Hgb urine dipstick: NEGATIVE
KETONES UR: NEGATIVE
Leukocytes, UA: NEGATIVE
Nitrite: NEGATIVE
PH: 7.5 (ref 5.0–8.0)
Protein, ur: NEGATIVE
SPECIFIC GRAVITY, URINE: 1.01 (ref 1.001–1.03)

## 2017-07-27 LAB — HEMOGLOBIN A1C
EAG (MMOL/L): 6.5 (calc)
HEMOGLOBIN A1C: 5.7 %{Hb} — AB (ref <5.7)
MEAN PLASMA GLUCOSE: 117 (calc)

## 2017-07-27 LAB — BASIC METABOLIC PANEL WITH GFR
BUN: 14 mg/dL (ref 7–25)
CO2: 29 mmol/L (ref 20–32)
CREATININE: 0.91 mg/dL (ref 0.70–1.18)
Calcium: 9.9 mg/dL (ref 8.6–10.3)
Chloride: 101 mmol/L (ref 98–110)
GFR, EST AFRICAN AMERICAN: 95 mL/min/{1.73_m2} (ref >=60)
GFR, EST NON AFRICAN AMERICAN: 82 mL/min/{1.73_m2} (ref >=60)
Glucose, Bld: 112 mg/dL — ABNORMAL HIGH (ref 65–99)
POTASSIUM: 4.5 mmol/L (ref 3.5–5.3)
SODIUM: 141 mmol/L (ref 135–146)

## 2017-07-27 LAB — VITAMIN D 25 HYDROXY (VIT D DEFICIENCY, FRACTURES): VIT D 25 HYDROXY: 88 ng/mL (ref 30–100)

## 2017-07-27 LAB — MAGNESIUM: Magnesium: 2 mg/dL (ref 1.5–2.5)

## 2017-07-27 LAB — INSULIN, RANDOM: INSULIN: 5.7 u[IU]/mL (ref 2.0–19.6)

## 2017-07-27 LAB — PSA: PSA: 1.1 ng/mL (ref <=4.0)

## 2017-07-27 LAB — URIC ACID: URIC ACID, SERUM: 5.8 mg/dL (ref 4.0–8.0)

## 2017-08-09 DIAGNOSIS — L723 Sebaceous cyst: Secondary | ICD-10-CM | POA: Diagnosis not present

## 2017-08-09 DIAGNOSIS — Z23 Encounter for immunization: Secondary | ICD-10-CM | POA: Diagnosis not present

## 2017-08-28 ENCOUNTER — Encounter: Payer: Self-pay | Admitting: Internal Medicine

## 2017-09-01 ENCOUNTER — Other Ambulatory Visit: Payer: Self-pay | Admitting: Internal Medicine

## 2017-09-01 DIAGNOSIS — I10 Essential (primary) hypertension: Secondary | ICD-10-CM

## 2017-09-12 ENCOUNTER — Encounter: Payer: Self-pay | Admitting: Internal Medicine

## 2017-09-12 ENCOUNTER — Ambulatory Visit (INDEPENDENT_AMBULATORY_CARE_PROVIDER_SITE_OTHER): Payer: PPO | Admitting: Internal Medicine

## 2017-09-12 VITALS — BP 128/72 | HR 56 | Temp 97.8°F | Resp 18 | Ht 75.5 in | Wt 204.4 lb

## 2017-09-12 DIAGNOSIS — I10 Essential (primary) hypertension: Secondary | ICD-10-CM | POA: Diagnosis not present

## 2017-09-12 DIAGNOSIS — R55 Syncope and collapse: Secondary | ICD-10-CM

## 2017-09-12 DIAGNOSIS — Z79899 Other long term (current) drug therapy: Secondary | ICD-10-CM | POA: Diagnosis not present

## 2017-09-12 NOTE — Patient Instructions (Signed)
Vasovagal Syncope, Adult  Syncope, which is commonly known as fainting or passing out, is a temporary loss of consciousness. It occurs when the blood flow to the brain is reduced. Vasovagal syncope, also called neurocardiogenic syncope, is a fainting spell that happens when blood flow to the brain is reduced because of a sudden drop in heart rate and blood pressure.  Vasovagal syncope is usually harmless. However, you can get injured if you fall during a fainting spell.  What are the causes?  This condition is caused by a drop in heart rate and blood pressure, usually in response to a trigger. Many things and situations can trigger an episode, including:  · Pain.  · Fear.  · The sight of blood. This may occur during medical procedures, such as when blood is being drawn from a vein.  · Common activities, such as coughing, swallowing, stretching, or going to the bathroom.  · Emotional stress.  · Being in a confined space.  · Prolonged standing, especially in a warm environment.  · Lack of sleep or rest.  · Not eating for a long time.  · Not drinking enough liquids.  · Recent illness.  · Drinking alcohol.  · Taking drugs that affect blood pressure, such as marijuana, cocaine, opiates, or inhalants.    What are the signs or symptoms?  Before a fainting episode, you may:  · Feel dizzy or light-headed.  · Become pale.  · Sense that you are going to faint.  · Feel like the room is spinning.  · Only see directly ahead (tunnel vision).  · Feel sick to your stomach (nauseous).  · See spots.  · Slowly lose vision.  · Hear ringing in your ears.  · Have a headache.  · Feel warm and sweaty.  · Feel a sensation of pins and needles.    During the fainting spell, you may twitch or make jerky movements. Fainting spells usually last no longer than a few minutes before you wake up. If you get up too quickly before your body can recover, you may faint again.  How is this diagnosed?  This condition is diagnosed based on your symptoms,  your medical history, and a physical exam. Tests may be done to rule out other causes of fainting. Tests may include:  · Blood tests.  · Heart tests, such as an electrocardiogram (ECG), echocardiogram, or electrophysiology study.  · A test to check your response to changes in position (tilt table test).    How is this treated?  Usually, treatment is not needed for this condition. Your health care provider may suggest ways to help prevent fainting episodes. These may include:  · Drinking additional fluids if you are exposed to a trigger.  · Sitting or lying down if you notice signs that an episode is coming.    If your fainting spells continue, your health care provider may recommend that you:  · Take medicines to prevent fainting or to help reduce further episodes of fainting.  · Do certain exercises.  · Wear compression stockings.  · Have surgery to place a pacemaker in your body (rare).    Follow these instructions at home:  · Learn to identify the signs that an episode is coming.  · Sit or lie down at the first sign of a fainting spell. If you sit down, put your head down between your legs. If you lie down, swing your legs up in the air to increase blood flow to the brain.  ·   Avoid hot tubs and saunas.  · Avoid standing for a long time. If you have to stand for a long time, try:  ? Crossing your legs.  ? Flexing and stretching your leg muscles.  ? Squatting.  ? Moving your legs.  ? Bending over.  · Drink enough fluid to keep your urine clear or pale yellow.  · Make changes to your diet that your health care provider recommends. You may be told to:  ? Avoid caffeine.  ? Eat more salt.  · Take over-the-counter and prescription medicines only as told by your health care provider.  Contact a health care provider if:  · You continue to have fainting spells despite treatment.  · You faint more often despite treatment.  · You lose consciousness for more than a few minutes.  · You faint during or after exercising or  after being startled.  · You have twitching or jerky movements for longer than a few seconds during a fainting spell.  · You have an episode of twitching or jerky movements without fainting.  Get help right away if:  · A fainting spell leads to an injury or bleeding.  · You have new symptoms that occur with the fainting spells, such as:  ? Shortness of breath.  ? Chest pain.  ? Irregular heartbeat.  · You twitch or make jerky movements for more than 5 minutes.  · You twitch or make jerky movements during more than one fainting spell.  This information is not intended to replace advice given to you by your health care provider. Make sure you discuss any questions you have with your health care provider.  Document Released: 04/25/2012 Document Revised: 10/21/2015 Document Reviewed: 03/07/2015  Elsevier Interactive Patient Education © 2018 [employer]

## 2017-09-12 NOTE — Progress Notes (Signed)
Subjective:    Patient ID: Michael Waller, male    DOB: 04-17-1941, 77 y.o.   MRN: [id number]  HPI  This 77 yo [name] presents relating hx/o apparently "fainting" about 4 days ago w/o premonition and was uncertain how long he was unconscious. He apparently fell unwitnessed on some soft materials and revived w/o evidence of tongue biting or incontinence. He denies presyncopal sx's.   Medication Sig  . Alfalfa 650 MG TABS Take 2 tablets by mouth daily.  Marland Kitchen allopurinol (ZYLOPRIM) 300 MG tablet TAKE 1 TABLET BY MOUTH DAILY  . aspirin 81 MG tablet Take 81 mg by mouth daily.  Marland Kitchen atorvastatin (LIPITOR) 40 MG tablet TAKE 1 TABLET BY MOUTH EACH NIGHT AT BEDTIME  . blood glucose meter kit and supplies Dispense based insurance preference. E11.9  . Cholecalciferol (VITAMIN D3) 2000 units TABS Take 3 tablets by mouth daily.  . Cinnamon 500 MG TABS Take 2 tablets by mouth daily.  Marland Kitchen doxazosin (CARDURA) 8 MG tablet TAKE 1 TABLET BY MOUTH DAILY  . enalapril (VASOTEC) 20 MG tablet Take 1 tablet 2 x/day - AM & PM for BP  . finasteride (PROSCAR) 5 MG tablet Take 5 mg by mouth every evening.   . Garlic 2202 MG CAPS Take 1,000 mg by mouth daily.  Marland Kitchen glucose blood (ONE TOUCH ULTRA TEST) test strip USE TO CHECK BLOOD SUGAR ONCE DAILY  . glucose blood test strip Check blood sugar once daily  . Magnesium 250 MG TABS Take 250 mg by mouth 2 (two) times daily. Breakfast and lunch  . minoxidil (LONITEN) 2.5 MG tablet Take 1 to 2 tablets in the AM for BP.  . Multiple Vitamin (MULTIVITAMIN) tablet Take 1 tablet by mouth daily.  . Omega-3 Fatty Acids (OMEGA-3 FISH OIL) 1200 MG CAPS Take 1 capsule by mouth daily with lunch.  . ranitidine (ZANTAC) 300 MG tablet Take 1 to 2 tablets daily for heartburn & reflux  . bisoprolol-hydrochlorothiazide (ZIAC) 10-6.25 MG tablet Take 1 tablet every morning for BP  . enalapril (VASOTEC) 20 MG tablet Take 1 tablet 2 x / day - am & pm for BP   No facility-administered medications prior to  visit.    Allergies  Allergen Reactions  . Adhesive [Tape] Other (See Comments)    reddness   Past Medical History:  Diagnosis Date  . Benign localized prostatic hyperplasia with lower urinary tract symptoms (LUTS)   . Bruises easily   . Elevated PSA   . GERD (gastroesophageal reflux disease)   . History of gout   . History of kidney stones   . Hyperlipidemia   . Hypertension   . Nephrolithiasis   . Nocturia   . Pre-diabetes   . Vitamin D deficiency   . Wears dentures    full upper and lower partial     10 point systems review negative except as above.    Objective:   Physical Exam  BP 128/72   Pulse (!) 56   Temp 97.8 F (36.6 C)   Resp 18   Ht 6' 3.5" (1.918 m)   Wt 204 lb 6.4 oz (92.7 kg)   BMI 25.21 kg/m   Postural   Sitting BP 161/83   P 54     and      Standing BP 166/85   P 66   HEENT - WNL. Neck - supple.  Chest - Clear equal BS. Cor - Nl HS. RRR w/o sig MGR. PP 1(+). No edema. MS- FROM  w/o deformities.  Gait Nl. Neuro -  Nl w/o focal abnormalities.    Assessment & Plan:   1. Essential hypertension  - CBC with Differential/Platelet - COMPLETE METABOLIC PANEL WITH GFR  - EKG 12-Lead - Showed NSR & WNL  2. Vasovagal syncope  - CBC with Differential/Platelet - COMPLETE METABOLIC PANEL WITH GFR  - EKG 12-Lead  3. Medication management  - CBC with Differential/Platelet - COMPLETE METABOLIC PANEL WITH GFR  - EKG 12-Lead

## 2017-09-13 ENCOUNTER — Other Ambulatory Visit: Payer: Self-pay | Admitting: Internal Medicine

## 2017-09-18 LAB — CBC WITH DIFFERENTIAL/PLATELET
BASOS PCT: 0.6 %
Basophils Absolute: 52 cells/uL (ref 0–200)
EOS ABS: 104 {cells}/uL (ref 15–500)
Eosinophils Relative: 1.2 %
HEMATOCRIT: 38.3 % — AB (ref 38.5–50.0)
Hemoglobin: 13.3 g/dL (ref 13.2–17.1)
LYMPHS ABS: 2201 {cells}/uL (ref 850–3900)
MCH: 31.8 pg (ref 27.0–33.0)
MCHC: 34.7 g/dL (ref 32.0–36.0)
MCV: 91.6 fL (ref 80.0–100.0)
MPV: 10.4 fL (ref 7.5–12.5)
Monocytes Relative: 7.6 %
Neutro Abs: 5681 cells/uL (ref 1500–7800)
Neutrophils Relative %: 65.3 %
Platelets: 199 10*3/uL (ref 140–400)
RBC: 4.18 10*6/uL — AB (ref 4.20–5.80)
RDW: 12.7 % (ref 11.0–15.0)
Total Lymphocyte: 25.3 %
WBC: 8.7 10*3/uL (ref 3.8–10.8)
WBCMIX: 661 {cells}/uL (ref 200–950)

## 2017-09-18 LAB — COMPLETE METABOLIC PANEL WITH GFR
AG RATIO: 1.9 (calc) (ref 1.0–2.5)
ALT: 22 U/L (ref 9–46)
AST: 22 U/L (ref 10–35)
Albumin: 4.4 g/dL (ref 3.6–5.1)
Alkaline phosphatase (APISO): 62 U/L (ref 40–115)
BILIRUBIN TOTAL: 0.7 mg/dL (ref 0.2–1.2)
BUN: 21 mg/dL (ref 7–25)
CALCIUM: 10 mg/dL (ref 8.6–10.3)
CO2: 32 mmol/L (ref 20–32)
Chloride: 103 mmol/L (ref 98–110)
Creat: 0.84 mg/dL (ref 0.70–1.18)
GFR, EST AFRICAN AMERICAN: 99 mL/min/{1.73_m2} (ref >=60)
GFR, EST NON AFRICAN AMERICAN: 85 mL/min/{1.73_m2} (ref >=60)
GLOBULIN: 2.3 g/dL (ref 1.9–3.7)
Glucose, Bld: 108 mg/dL — ABNORMAL HIGH (ref 65–99)
POTASSIUM: 4.4 mmol/L (ref 3.5–5.3)
SODIUM: 141 mmol/L (ref 135–146)
TOTAL PROTEIN: 6.7 g/dL (ref 6.1–8.1)

## 2017-10-18 ENCOUNTER — Ambulatory Visit (INDEPENDENT_AMBULATORY_CARE_PROVIDER_SITE_OTHER): Payer: PPO | Admitting: Internal Medicine

## 2017-10-18 ENCOUNTER — Encounter

## 2017-10-18 ENCOUNTER — Encounter: Payer: Self-pay | Admitting: Internal Medicine

## 2017-10-18 VITALS — BP 136/64 | HR 72 | Ht 75.5 in | Wt 204.0 lb

## 2017-10-18 DIAGNOSIS — R141 Gas pain: Secondary | ICD-10-CM | POA: Diagnosis not present

## 2017-10-18 DIAGNOSIS — Z800 Family history of malignant neoplasm of digestive organs: Secondary | ICD-10-CM

## 2017-10-18 DIAGNOSIS — R142 Eructation: Secondary | ICD-10-CM | POA: Diagnosis not present

## 2017-10-18 DIAGNOSIS — R1013 Epigastric pain: Secondary | ICD-10-CM | POA: Diagnosis not present

## 2017-10-18 DIAGNOSIS — R143 Flatulence: Secondary | ICD-10-CM

## 2017-10-18 NOTE — Progress Notes (Signed)
Michael Waller 77 y.o. 07/14/1940 [id number]  Assessment & Plan:   Encounter Diagnoses  Name Primary?  . Abdominal pain, epigastric Yes  . Family history of colon cancer   . Flatulence, eructation and gas pain     Seems like he may have an irritable bowel syndrome or functional dyspepsia or both.  Given his age and how much the symptoms bother him I am going to perform an upper endoscopy for the epigastric pain.  It does not sound biliary in origin.  He may need an adjustment of acid suppression or something directed at functional dyspepsia.  At his age gastrointestinal malignancy is in the differential as well.  Unlikely but possible.  A screening colonoscopy is recommended and he is agreed as well.  Further plans pending these results.  The risks and benefits as well as alternatives of endoscopic procedure(s) have been discussed and reviewed. All questions answered. The patient agrees to proceed. CC: Unk Pinto, MD     Subjective:   Chief Complaint: Gas and bloating occasional constipation screening for colon cancer  HPI The patient is a 77 year old married white man here with probably chronic epigastric pain that will last for 15 or 30 minutes, he will feel like he needs to belch or pass gas and the pain can get pretty intense.  It occurs half of the days of the month.  His weight has fluctuated a little bit but looking at things he has not had any progressive weight loss.  There is no dysphagia heartburn.  Sometimes the pain radiates up towards the chest.  Ranitidine was at 150 mg and was increased to 300 mg at bedtime and he says has helped some.  He has had some occasional constipation, that is probably chronic as well.  Stools have been dark at times but no clear melena and he is not anemic.  He had a colonoscopy we think in about 2008.  I do not have the exact records but we have documentation that it was done.  We have sent him letters to repeat a colonoscopy  starting in 2013, he had a sister with colon cancer diagnosed in her 4s.  Another sister had breast cancer an and a brother had pancreatic cancer.  The patient had an episode of what was thought to be vasovagal syncope back in April. Allergies  Allergen Reactions  . Adhesive [Tape] Other (See Comments)    reddness   Current Meds  Medication Sig  . Alfalfa 650 MG TABS Take 2 tablets by mouth daily.  Marland Kitchen allopurinol (ZYLOPRIM) 300 MG tablet TAKE 1 TABLET BY MOUTH DAILY  . aspirin 81 MG tablet Take 81 mg by mouth daily.  Marland Kitchen atorvastatin (LIPITOR) 40 MG tablet TAKE 1 TABLET BY MOUTH EACH NIGHT AT BEDTIME  . blood glucose meter kit and supplies Dispense based insurance preference. E11.9  . Cholecalciferol (VITAMIN D3) 2000 units TABS Take 3 tablets by mouth daily.  . Cinnamon 500 MG TABS Take 2 tablets by mouth daily.  Marland Kitchen doxazosin (CARDURA) 8 MG tablet TAKE 1 TABLET BY MOUTH DAILY  . enalapril (VASOTEC) 20 MG tablet Take 1 tablet 2 x/day - AM & PM for BP  . finasteride (PROSCAR) 5 MG tablet Take 5 mg by mouth every evening.   . Garlic 2202 MG CAPS Take 1,000 mg by mouth daily.  Marland Kitchen glucose blood (ONE TOUCH ULTRA TEST) test strip USE TO CHECK BLOOD SUGAR ONCE DAILY  . glucose blood test strip Check blood sugar  once daily  . Magnesium 250 MG TABS Take 250 mg by mouth 2 (two) times daily. Breakfast and lunch  . minoxidil (LONITEN) 2.5 MG tablet TAKE 1-2 TABLETS BY MOUTH IN THE [facility] BLOOD PRESSURE  . Multiple Vitamin (MULTIVITAMIN) tablet Take 1 tablet by mouth daily.  . Omega-3 Fatty Acids (OMEGA-3 FISH OIL) 1200 MG CAPS Take 1 capsule by mouth daily with lunch.  . ranitidine (ZANTAC) 300 MG tablet Take 1 to 2 tablets daily for heartburn & reflux   Past Medical History:  Diagnosis Date  . Benign localized prostatic hyperplasia with lower urinary tract symptoms (LUTS)   . Bruises easily   . Elevated PSA   . GERD (gastroesophageal reflux disease)   . History of gout   . History of kidney  stones   . Hyperlipidemia   . Hypertension   . Nephrolithiasis   . Nocturia   . Pre-diabetes   . Vitamin D deficiency   . Wears dentures    full upper and lower partial    Past Surgical History:  Procedure Laterality Date  . COLONOSCOPY  2008  . CYSTOSCOPY WITH RETROGRADE PYELOGRAM, URETEROSCOPY AND STENT PLACEMENT Bilateral 04/19/2016   Procedure: CYSTOSCOPY WITH BILATERAL RETROGRADE URETEROSCOPY BASKET EXTRACTION;  Surgeon: Irine Seal, MD;  Location: [facility];  Service: Urology;  Laterality: Bilateral;  . EXTRACORPOREAL SHOCK WAVE LITHOTRIPSY  [phone]  . HAND LIGAMENT RECONSTRUCTION Right 2009  . 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   1 caffeinated beverage daily.   Former user of smokeless tobacco and former smoker no current smoking drug use or alcohol   family history includes Cancer in his sister and sister; Diabetes in his brother, brother, brother, father, mother, and sister; Heart attack in his father; Heart disease in his brother, father, and mother; Stroke in his brother.   Review of Systems As per HPI.  Sinus drainage decreased hearing some chronic upper back pain mild pedal edema urinary frequency.  All other review of systems are negative.  Objective:   Physical Exam _0  136/64   Pulse 72   Ht 6' 3.5" (1.918 m)   Wt 204 lb (92.5 kg)   BMI 25.16 kg/m @  General:  Well-developed, well-nourished and in no acute distress Eyes:  anicteric. ENT:   Mouth and posterior pharynx free of lesions.  Neck:   supple w/o thyromegaly or mass.  Lungs: Clear to auscultation bilaterally. Heart:  S1S2, no rubs, murmurs, gallops. Abdomen:  soft, non-tender, no hepatosplenomegaly, hernia, or mass and BS+.  Rectal: deferred Lymph:  no cervical or supraclavicular  adenopathy. Extremities:   no edema, cyanosis or clubbing Skin   no rash. Neuro:  A&O x 3.  Psych:  appropriate mood and  Affect.   Data Reviewed:  PCP notes, labs from 2019 Weights back to 2017

## 2017-10-18 NOTE — Patient Instructions (Addendum)
  You have been scheduled for a colonoscopy and EGD.Please follow written instructions given to you at your visit today.  Please pick up your prep supplies at the pharmacy. If you use inhalers (even only as needed), please bring them with you on the day of your procedure.   I appreciate the opportunity to care for you. Carl Gessner, MD, FACG 

## 2017-10-30 NOTE — Progress Notes (Signed)
FOLLOW UP  Assessment and Plan:   Hypertension Well controlled with current medications  Monitor blood pressure at home; patient to call if consistently greater than 130/80 Continue DASH diet.   Reminder to go to the ER if any CP, SOB, nausea, dizziness, severe HA, changes vision/speech, left arm numbness and tingling and jaw pain.  Cholesterol Currently at goal; continue statin Continue low cholesterol diet and exercise.  Check lipid panel.   Prediabetes Continue diet and exercise.  Perform daily foot/skin check, notify office of any concerning changes.  Check A1C  BMI 25 Continue to recommend diet heavy in fruits and veggies and low in animal meats, cheeses, and dairy products, appropriate calorie intake Discuss exercise recommendations routinely Continue to monitor weight at each visit  Vitamin D Def At goal at last visit; continue supplementation to maintain goal of 70-100 Defer Vit D level  Gout Continue allopurinol Diet discussed Check uric acid as needed   Continue diet and meds as discussed. Further disposition pending results of labs. Discussed med's effects and SE's.   Over 30 minutes of exam, counseling, chart review, and critical decision making was performed.   Future Appointments  Date Time Provider [place]  10/31/2017  8:45 AM Liane Comber, NP [facility] None  12/26/2017 11:00 AM Gatha Mayer, MD [facility]  02/01/2018  9:30 AM Unk Pinto, MD [facility] None  08/15/2018  9:00 AM Unk Pinto, MD [facility] None    ----------------------------------------------------------------------------------------------------------------------  HPI 77 y.o. male  presents for 3 month follow up on hypertension, cholesterol, prediabetes, weight and vitamin D deficiency. He also has hyperuricemia, GERD, BPH with LUTS controlled on respective medications. He is followed by urology.   BMI is Body mass index is 25.04 kg/m., he has been  working on diet and exercise - he is very active in his yard, mowing, gardening, etc.  Wt Readings from Last 3 Encounters:  10/31/17 203 lb (92.1 kg)  10/18/17 204 lb (92.5 kg)  09/12/17 204 lb 6.4 oz (92.7 kg)   His blood pressure has been controlled at home, today their BP is BP: 120/74  He does workout. He denies chest pain, shortness of breath, dizziness.   He is on cholesterol medication (atorvastatin 20 mg daily) and denies myalgias. His cholesterol is at goal. The cholesterol last visit was:   Lab Results  Component Value Date   CHOL 164 07/26/2017   HDL 63 07/26/2017   LDLCALC 83 07/26/2017   TRIG 89 07/26/2017   CHOLHDL 2.6 07/26/2017    He has been working on diet and exercise for prediabetes, and denies foot ulcerations, increased appetite, nausea, paresthesia of the feet, polydipsia, polyuria, visual disturbances, vomiting and weight loss. Last A1C in the office was:  Lab Results  Component Value Date   HGBA1C 5.7 (H) 07/26/2017   Patient is on Vitamin D supplement.   Lab Results  Component Value Date   VD25OH 35 07/26/2017     Patient is on allopurinol for hyperurecemia and does not report a recent flare.  Lab Results  Component Value Date   LABURIC 5.8 07/26/2017      Current Medications:  Current Outpatient Medications on File Prior to Visit  Medication Sig  . Alfalfa 650 MG TABS Take 2 tablets by mouth daily.  Marland Kitchen allopurinol (ZYLOPRIM) 300 MG tablet TAKE 1 TABLET BY MOUTH DAILY  . aspirin 81 MG tablet Take 81 mg by mouth daily.  Marland Kitchen atorvastatin (LIPITOR) 40 MG tablet TAKE 1 TABLET BY MOUTH EACH  NIGHT AT BEDTIME  . blood glucose meter kit and supplies Dispense based insurance preference. E11.9  . Cholecalciferol (VITAMIN D3) 2000 units TABS Take 3 tablets by mouth daily.  . Cinnamon 500 MG TABS Take 2 tablets by mouth daily.  Marland Kitchen doxazosin (CARDURA) 8 MG tablet TAKE 1 TABLET BY MOUTH DAILY  . enalapril (VASOTEC) 20 MG tablet Take 1 tablet 2 x/day - AM & PM  for BP  . finasteride (PROSCAR) 5 MG tablet Take 5 mg by mouth every evening.   . Garlic 6384 MG CAPS Take 1,000 mg by mouth daily.  Marland Kitchen glucose blood (ONE TOUCH ULTRA TEST) test strip USE TO CHECK BLOOD SUGAR ONCE DAILY  . glucose blood test strip Check blood sugar once daily  . Magnesium 250 MG TABS Take 250 mg by mouth 2 (two) times daily. Breakfast and lunch  . minoxidil (LONITEN) 2.5 MG tablet TAKE 1-2 TABLETS BY MOUTH IN THE [facility] BLOOD PRESSURE  . Multiple Vitamin (MULTIVITAMIN) tablet Take 1 tablet by mouth daily.  . Omega-3 Fatty Acids (OMEGA-3 FISH OIL) 1200 MG CAPS Take 1 capsule by mouth daily with lunch.  . ranitidine (ZANTAC) 300 MG tablet Take 1 to 2 tablets daily for heartburn & reflux  . bisoprolol-hydrochlorothiazide (ZIAC) 10-6.25 MG tablet Take 1 tablet every morning for BP   No current facility-administered medications on file prior to visit.      Allergies:  Allergies  Allergen Reactions  . Adhesive [Tape] Other (See Comments)    reddness     Medical History:  Past Medical History:  Diagnosis Date  . Benign localized prostatic hyperplasia with lower urinary tract symptoms (LUTS)   . Bruises easily   . Elevated PSA   . GERD (gastroesophageal reflux disease)   . History of gout   . History of kidney stones   . Hyperlipidemia   . Hypertension   . Nephrolithiasis   . Nocturia   . Pre-diabetes   . Vitamin D deficiency   . Wears dentures    full upper and lower partial    Family history- Reviewed and unchanged Social history- Reviewed and unchanged   Review of Systems:  Review of Systems  Constitutional: Negative for malaise/fatigue and weight loss.  HENT: Negative for hearing loss and tinnitus.   Eyes: Negative for blurred vision and double vision.  Respiratory: Negative for cough, shortness of breath and wheezing.   Cardiovascular: Negative for chest pain, palpitations, orthopnea, claudication and leg swelling.  Gastrointestinal: Negative for  abdominal pain, blood in stool, constipation, diarrhea, heartburn, melena, nausea and vomiting.  Genitourinary: Negative.   Musculoskeletal: Negative for joint pain and myalgias.  Skin: Negative for rash.  Neurological: Negative for dizziness, tingling, sensory change, weakness and headaches.  Endo/Heme/Allergies: Negative for polydipsia.  Psychiatric/Behavioral: Negative.   All other systems reviewed and are negative.   Physical Exam: BP 120/74   Pulse (!) 58   Temp 97.8 F (36.6 C)   Resp 14   Ht 6' 3.5" (1.918 m)   Wt 203 lb (92.1 kg)   SpO2 97%   BMI 25.04 kg/m  Wt Readings from Last 3 Encounters:  10/31/17 203 lb (92.1 kg)  10/18/17 204 lb (92.5 kg)  09/12/17 204 lb 6.4 oz (92.7 kg)   General Appearance: Well nourished, in no apparent distress. Eyes: PERRLA, EOMs, conjunctiva no swelling or erythema Sinuses: No Frontal/maxillary tenderness ENT/Mouth: Ext aud canals clear, TMs without erythema, bulging. No erythema, swelling, or exudate on post pharynx.  Tonsils not  swollen or erythematous. Hearing normal.  Neck: Supple, thyroid normal.  Respiratory: Respiratory effort normal, BS equal bilaterally without rales, rhonchi, wheezing or stridor.  Cardio: RRR with no MRGs. Brisk peripheral pulses without edema.  Abdomen: Soft, + BS.  Non tender, no guarding, rebound, hernias, masses. Lymphatics: Non tender without lymphadenopathy.  Musculoskeletal: Full ROM, 5/5 strength, Normal gait Skin: Warm, dry without rashes, lesions, ecchymosis.  Neuro: Cranial nerves intact. No cerebellar symptoms.  Psych: Awake and oriented X 3, normal affect, Insight and Judgment appropriate.    Izora Ribas, NP 8:36 AM [facility]

## 2017-10-31 ENCOUNTER — Encounter: Payer: Self-pay | Admitting: Adult Health

## 2017-10-31 ENCOUNTER — Ambulatory Visit (INDEPENDENT_AMBULATORY_CARE_PROVIDER_SITE_OTHER): Payer: PPO | Admitting: Adult Health

## 2017-10-31 VITALS — BP 120/74 | HR 58 | Temp 97.8°F | Resp 14 | Ht 75.5 in | Wt 203.0 lb

## 2017-10-31 DIAGNOSIS — E782 Mixed hyperlipidemia: Secondary | ICD-10-CM

## 2017-10-31 DIAGNOSIS — E790 Hyperuricemia without signs of inflammatory arthritis and tophaceous disease: Secondary | ICD-10-CM | POA: Diagnosis not present

## 2017-10-31 DIAGNOSIS — Z6825 Body mass index (BMI) 25.0-25.9, adult: Secondary | ICD-10-CM

## 2017-10-31 DIAGNOSIS — Z79899 Other long term (current) drug therapy: Secondary | ICD-10-CM | POA: Diagnosis not present

## 2017-10-31 DIAGNOSIS — I10 Essential (primary) hypertension: Secondary | ICD-10-CM | POA: Diagnosis not present

## 2017-10-31 DIAGNOSIS — R7303 Prediabetes: Secondary | ICD-10-CM | POA: Diagnosis not present

## 2017-10-31 DIAGNOSIS — E559 Vitamin D deficiency, unspecified: Secondary | ICD-10-CM | POA: Diagnosis not present

## 2017-10-31 NOTE — Patient Instructions (Signed)
Aim for 7+ servings of fruits and vegetables daily  80+ fluid ounces of water or unsweet tea for healthy kidneys  Limit alcohol intake, avoid smoking  Limit animal fats in diet for cholesterol and heart health - choose grass fed whenever available  Aim for low stress - take time to unwind and care for your mental health  Aim for 150 min of moderate intensity exercise weekly for heart health, and weights twice weekly for bone health  Aim for 7-9 hours of sleep daily      When it comes to diets, agreement about the perfect plan isn't easy to find, even among the experts. Experts at the [employer] developed an idea known as the Healthy Eating Plate. Just imagine a plate divided into logical, healthy portions.  The emphasis is on diet quality:  Load up on vegetables and fruits - one-half of your plate: Aim for color and variety, and remember that potatoes don't count.  Go for whole grains - one-quarter of your plate: Whole wheat, barley, wheat berries, quinoa, oats, brown rice, and foods made with them. If you want pasta, go with whole wheat pasta.  Protein power - one-quarter of your plate: Fish, chicken, beans, and nuts are all healthy, versatile protein sources. Limit red meat.  The diet, however, does go beyond the plate, offering a few other suggestions.  Use healthy plant oils, such as olive, canola, soy, corn, sunflower and peanut. Check the labels, and avoid partially hydrogenated oil, which have unhealthy trans fats.  If you're thirsty, drink water. Coffee and tea are good in moderation, but skip sugary drinks and limit milk and dairy products to one or two daily servings.  The type of carbohydrate in the diet is more important than the amount. Some sources of carbohydrates, such as vegetables, fruits, whole grains, and beans-are healthier than others.  Finally, stay active.   

## 2017-11-01 LAB — CBC WITH DIFFERENTIAL/PLATELET
Basophils Absolute: 40 cells/uL (ref 0–200)
Basophils Relative: 0.6 %
Eosinophils Absolute: 80 cells/uL (ref 15–500)
Eosinophils Relative: 1.2 %
HCT: 38.5 % (ref 38.5–50.0)
Hemoglobin: 13.4 g/dL (ref 13.2–17.1)
Lymphs Abs: 1608 cells/uL (ref 850–3900)
MCH: 32.1 pg (ref 27.0–33.0)
MCHC: 34.8 g/dL (ref 32.0–36.0)
MCV: 92.3 fL (ref 80.0–100.0)
MPV: 10.3 fL (ref 7.5–12.5)
Monocytes Relative: 7.7 %
Neutro Abs: 4456 cells/uL (ref 1500–7800)
Neutrophils Relative %: 66.5 %
Platelets: 176 10*3/uL (ref 140–400)
RBC: 4.17 10*6/uL — ABNORMAL LOW (ref 4.20–5.80)
RDW: 12.7 % (ref 11.0–15.0)
Total Lymphocyte: 24 %
WBC mixed population: 516 cells/uL (ref 200–950)
WBC: 6.7 10*3/uL (ref 3.8–10.8)

## 2017-11-01 LAB — TSH: TSH: 1.24 mIU/L (ref 0.40–4.50)

## 2017-11-01 LAB — COMPLETE METABOLIC PANEL WITH GFR
AG Ratio: 2.1 (calc) (ref 1.0–2.5)
ALT: 23 U/L (ref 9–46)
AST: 27 U/L (ref 10–35)
Albumin: 4.4 g/dL (ref 3.6–5.1)
Alkaline phosphatase (APISO): 64 U/L (ref 40–115)
BUN: 22 mg/dL (ref 7–25)
CO2: 29 mmol/L (ref 20–32)
Calcium: 9.8 mg/dL (ref 8.6–10.3)
Chloride: 105 mmol/L (ref 98–110)
Creat: 0.86 mg/dL (ref 0.70–1.18)
GFR, Est African American: 98 mL/min/{1.73_m2} (ref >=60)
GFR, Est Non African American: 84 mL/min/{1.73_m2} (ref >=60)
Globulin: 2.1 g/dL (calc) (ref 1.9–3.7)
Glucose, Bld: 115 mg/dL — ABNORMAL HIGH (ref 65–99)
Potassium: 4.3 mmol/L (ref 3.5–5.3)
Sodium: 141 mmol/L (ref 135–146)
Total Bilirubin: 0.7 mg/dL (ref 0.2–1.2)
Total Protein: 6.5 g/dL (ref 6.1–8.1)

## 2017-11-01 LAB — HEMOGLOBIN A1C
Hgb A1c MFr Bld: 5.4 % of total Hgb (ref <5.7)
Mean Plasma Glucose: 108 (calc)
eAG (mmol/L): 6 (calc)

## 2017-11-01 LAB — LIPID PANEL
Cholesterol: 147 mg/dL (ref <200)
HDL: 57 mg/dL (ref >40)
LDL Cholesterol (Calc): 76 mg/dL (calc)
Non-HDL Cholesterol (Calc): 90 mg/dL (calc) (ref <130)
Total CHOL/HDL Ratio: 2.6 (calc) (ref <5.0)
Triglycerides: 48 mg/dL (ref <150)

## 2017-12-12 ENCOUNTER — Other Ambulatory Visit: Payer: Self-pay | Admitting: Physician Assistant

## 2017-12-26 ENCOUNTER — Encounter: Payer: Self-pay | Admitting: Internal Medicine

## 2017-12-26 ENCOUNTER — Telehealth: Payer: Self-pay | Admitting: Internal Medicine

## 2017-12-26 ENCOUNTER — Ambulatory Visit (AMBULATORY_SURGERY_CENTER): Payer: PPO | Admitting: Internal Medicine

## 2017-12-26 VITALS — BP 126/57 | HR 47 | Temp 98.4°F | Resp 9 | Ht 75.5 in | Wt 203.0 lb

## 2017-12-26 DIAGNOSIS — Z800 Family history of malignant neoplasm of digestive organs: Secondary | ICD-10-CM | POA: Diagnosis not present

## 2017-12-26 DIAGNOSIS — Z86010 Personal history of colonic polyps: Secondary | ICD-10-CM | POA: Diagnosis not present

## 2017-12-26 DIAGNOSIS — R1013 Epigastric pain: Secondary | ICD-10-CM

## 2017-12-26 DIAGNOSIS — D123 Benign neoplasm of transverse colon: Secondary | ICD-10-CM

## 2017-12-26 DIAGNOSIS — Z1211 Encounter for screening for malignant neoplasm of colon: Secondary | ICD-10-CM | POA: Diagnosis not present

## 2017-12-26 DIAGNOSIS — D124 Benign neoplasm of descending colon: Secondary | ICD-10-CM

## 2017-12-26 MED ORDER — SODIUM CHLORIDE 0.9 % IV SOLN: 500.0000 mL | Freq: Once | INTRAVENOUS | Status: DC

## 2017-12-26 NOTE — Progress Notes (Signed)
Spontaneous respirations throughout. VSS. Resting comfortably. To PACU on room air. Report to  RN. 

## 2017-12-26 NOTE — Op Note (Signed)
[place] Patient Name: Michael Waller Procedure Date: 12/26/2017 11:25 AM MRN: [id number] Endoscopist: Gatha Mayer , MD Age: 77 Referring MD:  Date of Birth: March 05, 1941 Gender: Male Account #: [id number] Procedure:                Colonoscopy Indications:              Screening in patient at increased risk: Family                            history of 1st-degree relative with colorectal                            cancer Medicines:                Propofol per Anesthesia, Monitored Anesthesia Care Procedure:                Pre-Anesthesia Assessment:                           - Prior to the procedure, a History and Physical                            was performed, and patient medications and                            allergies were reviewed. The patient's tolerance of                            previous anesthesia was also reviewed. The risks                            and benefits of the procedure and the sedation                            options and risks were discussed with the patient.                            All questions were answered, and informed consent                            was obtained. Prior Anticoagulants: The patient has                            taken no previous anticoagulant or antiplatelet                            agents. ASA Grade Assessment: II - A patient with                            mild systemic disease. After reviewing the risks                            and benefits, the patient was deemed in  satisfactory condition to undergo the procedure.                           After obtaining informed consent, the colonoscope                            was passed under direct vision. Throughout the                            procedure, the patient's blood pressure, pulse, and                            oxygen saturations were monitored continuously. The                            Colonoscope was introduced through  the anus and                            advanced to the the cecum, identified by                            appendiceal orifice and ileocecal valve. The                            colonoscopy was performed without difficulty. The                            patient tolerated the procedure well. The quality                            of the bowel preparation was good. The ileocecal                            valve, appendiceal orifice, and rectum were                            photographed. The bowel preparation used was                            Miralax. Scope In: 11:36:05 AM Scope Out: 11:54:05 AM Scope Withdrawal Time: 0 hours 12 minutes 11 seconds  Total Procedure Duration: 0 hours 18 minutes 0 seconds  Findings:                 The perianal and digital rectal examinations were                            normal. Pertinent negatives include normal prostate                            (size, shape, and consistency).                           Two sessile polyps were found in the descending  colon and transverse colon. The polyps were                            diminutive in size. These polyps were removed with                            a cold snare. Resection and retrieval were                            complete. Verification of patient identification                            for the specimen was done. Estimated blood loss was                            minimal.                           Diverticula were found in the sigmoid colon.                           The exam was otherwise without abnormality on                            direct and retroflexion views. Complications:            No immediate complications. Estimated Blood Loss:     Estimated blood loss was minimal. Impression:               - Two diminutive polyps in the descending colon and                            in the transverse colon, removed with a cold snare.                             Resected and retrieved.                           - Diverticulosis in the sigmoid colon.                           - The examination was otherwise normal on direct                            and retroflexion views. Recommendation:           - Patient has a contact number available for                            emergencies. The signs and symptoms of potential                            delayed complications were discussed with the                            patient. Return to normal  activities tomorrow.                            Written discharge instructions were provided to the                            patient.                           - Resume previous diet.                           - Continue present medications.                           - No repeat colonoscopy. Gatha Mayer, MD 12/26/2017 12:03:48 PM This report has been signed electronically.

## 2017-12-26 NOTE — Progress Notes (Signed)
Called to room to assist during endoscopic procedure.  Patient ID and intended procedure confirmed with present staff. Received instructions for my participation in the procedure from the performing physician.  

## 2017-12-26 NOTE — Op Note (Signed)
[place] Patient Name: Michael Waller Procedure Date: 12/26/2017 11:25 AM MRN: [id number] Endoscopist: Gatha Mayer , MD Age: 77 Referring MD:  Date of Birth: Sep 08, 1940 Gender: Male Account #: [id number] Procedure:                Upper GI endoscopy Indications:              Epigastric abdominal pain Procedure:                After obtaining informed consent, the endoscope was                            passed under direct vision. Throughout the                            procedure, the patient's blood pressure, pulse, and                            oxygen saturations were monitored continuously. The                            Model GIF-[id number]) scope was introduced                            through the mouth, and advanced to the second part                            of duodenum. The upper GI endoscopy was                            accomplished without difficulty. The patient                            tolerated the procedure well. Scope In: Scope Out: Findings:                 The esophagus was normal.                           The stomach was normal.                           The examined duodenum was normal. Complications:            No immediate complications. Estimated Blood Loss:     Estimated blood loss: none. Impression:               - Normal esophagus.                           - Normal stomach.                           - Normal examined duodenum.                           - No specimens collected. Recommendation:           - Patient has a contact number available for  emergencies. The signs and symptoms of potential                            delayed complications were discussed with the                            patient. Return to normal activities tomorrow.                            Written discharge instructions were provided to the                            patient.                           - Resume previous  diet.                           - Continue present medications.                           - See the other procedure note for documentation of                            additional recommendations. colonoscopy next Gatha Mayer, MD 12/26/2017 11:59:06 AM This report has been signed electronically.

## 2017-12-26 NOTE — Patient Instructions (Addendum)
The esophagus, stomach and upper intestine look normal.  I found and removed 2 colon polyps that look benign. I doubt I will recommend another routine colonoscopy - will let you know the pathology results and plans.  I appreciate the opportunity to care for you. Gatha Mayer, MD, FACG   YOU HAD AN ENDOSCOPIC PROCEDURE TODAY AT [facility]:   Refer to the procedure report that was given to you for any specific questions about what was found during the examination.  If the procedure report does not answer your questions, please call your gastroenterologist to clarify.  If you requested that your care partner not be given the details of your procedure findings, then the procedure report has been included in a sealed envelope for you to review at your convenience later.  YOU SHOULD EXPECT: Some feelings of bloating in the abdomen. Passage of more gas than usual.  Walking can help get rid of the air that was put into your GI tract during the procedure and reduce the bloating. If you had a lower endoscopy (such as a colonoscopy or flexible sigmoidoscopy) you may notice spotting of blood in your stool or on the toilet paper. If you underwent a bowel prep for your procedure, you may not have a normal bowel movement for a few days.  Please Note:  You might notice some irritation and congestion in your nose or some drainage.  This is from the oxygen used during your procedure.  There is no need for concern and it should clear up in a day or so.  SYMPTOMS TO REPORT IMMEDIATELY:   Following lower endoscopy (colonoscopy or flexible sigmoidoscopy):  Excessive amounts of blood in the stool  Significant tenderness or worsening of abdominal pains  Swelling of the abdomen that is new, acute  Fever of 100F or higher  Endoscopy"  Vomiting blood or coffee ground material Shortness of breath Difficulty swallowing Fever over 100 Black tarry stools  For urgent or emergent issues,  a gastroenterologist can be reached at any hour by calling [phone].   DIET:  We do recommend a small meal at first, but then you may proceed to your regular diet.  Drink plenty of fluids but you should avoid alcoholic beverages for 24 hours.  ACTIVITY:  You should plan to take it easy for the rest of today and you should NOT DRIVE or use heavy machinery until tomorrow (because of the sedation medicines used during the test).    FOLLOW UP: Our staff will call the number listed on your records the next business day following your procedure to check on you and address any questions or concerns that you may have regarding the information given to you following your procedure. If we do not reach you, we will leave a message.  However, if you are feeling well and you are not experiencing any problems, there is no need to return our call.  We will assume that you have returned to your regular daily activities without incident.  If any biopsies were taken you will be contacted by phone or by letter within the next 1-3 weeks.  Please call us at [phone] if you have not heard about the biopsies in 3 weeks.    SIGNATURES/CONFIDENTIALITY: You and/or your care partner have signed paperwork which will be entered into your electronic medical record.  These signatures attest to the fact that that the information above on your After Visit Summary has been reviewed and is  understood.  Full responsibility of the confidentiality of this discharge information lies with you and/or your care-partner.YOU HAD AN ENDOSCOPIC PROCEDURE TODAY AT [facility]:   Refer to the procedure report that was given to you for any specific questions about what was found during the examination.  If the procedure report does not answer your questions, please call your gastroenterologist to clarify.  If you requested that your care partner not be given the details of your procedure findings, then the procedure  report has been included in a sealed envelope for you to review at your convenience later.  Read all handouts given to you by your recovery room nurse.

## 2017-12-26 NOTE — Telephone Encounter (Signed)
Spoke with patient and he said that "they told me that I probably wouldn't have a bowel movement for a couple of days."  He has had 2 since he got home.   No bleeding and no pain. He's okay with it.

## 2017-12-27 ENCOUNTER — Telehealth: Payer: Self-pay | Admitting: *Deleted

## 2017-12-27 NOTE — Telephone Encounter (Signed)
  Follow up Call-  Call back number 12/26/2017  Post procedure Call Back phone  # [phone]  Permission to leave phone message Yes  Some recent data might be hidden     Patient questions:  Do you have a fever, pain , or abdominal swelling? No. Pain Score  0 *  Have you tolerated food without any problems? Yes.    Have you been able to return to your normal activities? Yes.    Do you have any questions about your discharge instructions: Diet   No. Medications  No. Follow up visit  No.  Do you have questions or concerns about your Care? Yes.    Actions: * If pain score is 4 or above: No action needed, pain <4.

## 2018-01-02 ENCOUNTER — Encounter: Payer: Self-pay | Admitting: Internal Medicine

## 2018-01-02 NOTE — Progress Notes (Signed)
Diminutive adenoma and lymphoid polyp No recall - age My Chart

## 2018-02-01 ENCOUNTER — Encounter: Payer: Self-pay | Admitting: Internal Medicine

## 2018-02-01 ENCOUNTER — Ambulatory Visit (INDEPENDENT_AMBULATORY_CARE_PROVIDER_SITE_OTHER): Payer: PPO | Admitting: Internal Medicine

## 2018-02-01 VITALS — BP 122/66 | HR 52 | Temp 97.6°F | Resp 16 | Ht 75.5 in | Wt 202.2 lb

## 2018-02-01 DIAGNOSIS — Z79899 Other long term (current) drug therapy: Secondary | ICD-10-CM

## 2018-02-01 DIAGNOSIS — E559 Vitamin D deficiency, unspecified: Secondary | ICD-10-CM | POA: Diagnosis not present

## 2018-02-01 DIAGNOSIS — R7309 Other abnormal glucose: Secondary | ICD-10-CM | POA: Diagnosis not present

## 2018-02-01 DIAGNOSIS — R7303 Prediabetes: Secondary | ICD-10-CM | POA: Diagnosis not present

## 2018-02-01 DIAGNOSIS — E782 Mixed hyperlipidemia: Secondary | ICD-10-CM | POA: Diagnosis not present

## 2018-02-01 DIAGNOSIS — M1000 Idiopathic gout, unspecified site: Secondary | ICD-10-CM | POA: Diagnosis not present

## 2018-02-01 DIAGNOSIS — I10 Essential (primary) hypertension: Secondary | ICD-10-CM

## 2018-02-01 NOTE — Progress Notes (Signed)
    This very nice 77 y.o. [name] presents for 6 month follow up with HTN, HLD, Pre-Diabetes and Vitamin D Deficiency. Patient has Gout controlled an his meds and likewise his GERD is controlled.      Patient is treated for HTN (1986) & BP has been controlled at home. Today's BP is at goal -  122/66. Patient has had no complaints of any cardiac type chest pain,  dyspnea / orthopnea / PND, dizziness, claudication, or dependent edema.Doe report occassional fluttering palpitations.     Hyperlipidemia is controlled with diet & meds. Patient denies myalgias or other med SE's. Last Lipids were at goal: Lab Results  Component Value Date   CHOL 147 10/31/2017   HDL 57 10/31/2017   LDLCALC 76 10/31/2017   TRIG 48 10/31/2017   CHOLHDL 2.6 10/31/2017      Also, the patient has history of  PreDiabetes  (A1c 5.8%/2006) and has had no symptoms of reactive hypoglycemia, diabetic polys, paresthesias or visual blurring.  Last A1c was Normal & at goal: Lab Results  Component Value Date   HGBA1C 5.4 10/31/2017      Further, the patient also has history of Vitamin D Deficiency ("49"on Tx/2008)  and supplements vitamin D without any suspected side-effects. Last vitamin D was at goal:  Lab Results  Component Value Date   VD25OH 88 07/26/2017   Current Outpatient Medications on File Prior to Visit  Medication Sig  . Alfalfa 650 MG TABS Take 2 tablets by mouth daily.  . allopurinol (ZYLOPRIM) 300 MG tablet TAKE 1 TABLET BY MOUTH DAILY  . aspirin 81 MG tablet Take 81 mg by mouth daily.  . atorvastatin (LIPITOR) 40 MG tablet TAKE 1 TABLET BY MOUTH EACH NIGHT AT BEDTIME  . blood glucose meter kit and supplies Dispense based insurance preference. E11.9  . Cholecalciferol (VITAMIN D3) 2000 units TABS Take 3 tablets by mouth daily.  . Cinnamon 500 MG TABS Take 2 tablets by mouth daily.  . doxazosin (CARDURA) 8 MG tablet TAKE 1 TABLET BY MOUTH DAILY  . enalapril (VASOTEC) 20 MG tablet Take 1 tablet 2 x/day - AM &  PM for BP  . finasteride (PROSCAR) 5 MG tablet Take 5 mg by mouth every evening.   . Garlic 1000 MG CAPS Take 1,000 mg by mouth daily.  . glucose blood (ONE TOUCH ULTRA TEST) test strip USE TO CHECK BLOOD SUGAR ONCE DAILY  . glucose blood test strip Check blood sugar once daily  . Magnesium 250 MG TABS Take 250 mg by mouth 2 (two) times daily. Breakfast and lunch  . minoxidil (LONITEN) 2.5 MG tablet TAKE 1-2 TABLETS BY MOUTH IN THE MORNINGFOR BLOOD PRESSURE  . Multiple Vitamin (MULTIVITAMIN) tablet Take 1 tablet by mouth daily.  . Omega-3 Fatty Acids (OMEGA-3 FISH OIL) 1200 MG CAPS Take 1 capsule by mouth daily with lunch.  . ranitidine (ZANTAC) 300 MG tablet Take 1 to 2 tablets daily for heartburn & reflux  . bisoprolol-hydrochlorothiazide (ZIAC) 10-6.25 MG tablet Take 1 tablet every morning for BP   Current Facility-Administered Medications on File Prior to Visit  Medication  . 0.9 %  sodium chloride infusion   Allergies  Allergen Reactions  . Adhesive [Tape] Other (See Comments)    reddness   PMHx:   Past Medical History:  Diagnosis Date  . Benign localized prostatic hyperplasia with lower urinary tract symptoms (LUTS)   . Bruises easily   . Elevated PSA   . GERD (gastroesophageal   reflux disease)   . History of gout   . History of kidney stones   . Hyperlipidemia   . Hypertension   . Nephrolithiasis   . Nocturia   . Pre-diabetes   . Vitamin D deficiency   . Wears dentures    full upper and lower partial    Immunization History  Administered Date(s) Administered  . DTaP 08/31/2005  . PPD Test 03/17/2014  . Td 07/26/2017   Past Surgical History:  Procedure Laterality Date  . COLONOSCOPY  2008  . CYSTOSCOPY WITH RETROGRADE PYELOGRAM, URETEROSCOPY AND STENT PLACEMENT Bilateral 04/19/2016   Procedure: CYSTOSCOPY WITH BILATERAL RETROGRADE URETEROSCOPY BASKET EXTRACTION;  Surgeon: Irine Seal, MD;  Location: [facility];  Service: Urology;  Laterality:  Bilateral;  . EXTRACORPOREAL SHOCK WAVE LITHOTRIPSY  [phone]  . HAND LIGAMENT RECONSTRUCTION Right 2009  . PROSTATE BIOPSY  2000, 12/2004   negative   FHx:    Reviewed / unchanged  SHx:    Reviewed / unchanged   Systems Review:  Constitutional: Denies fever, chills, wt changes, headaches, insomnia, fatigue, night sweats, change in appetite. Eyes: Denies redness, blurred vision, diplopia, discharge, itchy, watery eyes.  ENT: Denies discharge, congestion, post nasal drip, epistaxis, sore throat, earache, hearing loss, dental pain, tinnitus, vertigo, sinus pain, snoring.  CV: Denies chest pain, palpitations, irregular heartbeat, syncope, dyspnea, diaphoresis, orthopnea, PND, claudication or edema. Respiratory: denies cough, dyspnea, DOE, pleurisy, hoarseness, laryngitis, wheezing.  Gastrointestinal: Denies dysphagia, odynophagia, heartburn, reflux, water brash, abdominal pain or cramps, nausea, vomiting, bloating, diarrhea, constipation, hematemesis, melena, hematochezia  or hemorrhoids. Genitourinary: Denies dysuria, frequency, urgency, nocturia, hesitancy, discharge, hematuria or flank pain. Musculoskeletal: Denies arthralgias, myalgias, stiffness, jt. swelling, pain, limping or strain/sprain.  Skin: Denies pruritus, rash, hives, warts, acne, eczema or change in skin lesion(s). Neuro: No weakness, tremor, incoordination, spasms, paresthesia or pain. Psychiatric: Denies confusion, memory loss or sensory loss. Endo: Denies change in weight, skin or hair change.  Heme/Lymph: No excessive bleeding, bruising or enlarged lymph nodes.  Physical Exam  BP 122/66   Pulse (!) 52   Temp 97.6 F (36.4 C)   Resp 16   Ht 6' 3.5" (1.918 m)   Wt 202 lb 3.2 oz (91.7 kg)   BMI 24.94 kg/m   Appears  well nourished, well groomed  and in no distress.  Eyes: PERRLA, EOMs, conjunctiva no swelling or erythema. Sinuses: No frontal/maxillary tenderness ENT/Mouth: EAC's clear, TM's nl w/o  erythema, bulging. Nares clear w/o erythema, swelling, exudates. Oropharynx clear without erythema or exudates. Oral hygiene is good. Tongue normal, non obstructing. Hearing intact.  Neck: Supple. Thyroid not palpable. Car 2+/2+ without bruits, nodes or JVD. Chest: Respirations nl with BS clear & equal w/o rales, rhonchi, wheezing or stridor.  Cor: Heart sounds normal w/ regular rate and rhythm without sig. murmurs, gallops, clicks or rubs. Peripheral pulses normal and equal  without edema.  Abdomen: Soft & bowel sounds normal. Non-tender w/o guarding, rebound, hernias, masses or organomegaly.  Lymphatics: Unremarkable.  Musculoskeletal: Full ROM all peripheral extremities, joint stability, 5/5 strength and normal gait.  Skin: Warm, dry without exposed rashes, lesions or ecchymosis apparent.  Neuro: Cranial nerves intact, reflexes equal bilaterally. Sensory-motor testing grossly intact. Tendon reflexes grossly intact.  Pysch: Alert & oriented x 3.  Insight and judgement nl & appropriate. No ideations.  Assessment and Plan:  1. Essential hypertension  - Continue medication, monitor blood pressure at home.  - Continue DASH diet.  Reminder to go to the ER  if any CP,  SOB, nausea, dizziness, severe HA, changes vision/speech.  - CBC with Differential/Platelet - COMPLETE METABOLIC PANEL WITH GFR - Magnesium - TSH  2. Hyperlipidemia, mixed  - Continue diet/meds, exercise,& lifestyle modifications.  - Continue monitor periodic cholesterol/liver & renal functions   - Lipid panel - TSH  3. Abnormal glucose  - Continue diet, exercise, lifestyle modifications.  - Monitor appropriate labs.  - Hemoglobin A1c - Insulin, random  4. Vitamin D deficiency  - Continue supplementation.   - VITAMIN D 25 Hydroxyl  5. Prediabetes  - Hemoglobin A1c - Insulin, random  6. Idiopathic gout  - Uric acid  7. Medication management  - CBC with Differential/Platelet - COMPLETE METABOLIC PANEL  WITH GFR - Magnesium - Lipid panel - TSH - Hemoglobin A1c - Insulin, random - VITAMIN D 25 Hydroxyl - Uric acid      Discussed  regular exercise, BP monitoring, weight control to achieve/maintain BMI less than 25 and discussed med and SE's. Recommended labs to assess and monitor clinical status with further disposition pending results of labs. Over 30 minutes of exam, counseling, chart review was performed.  

## 2018-02-01 NOTE — Patient Instructions (Signed)

## 2018-02-02 LAB — LIPID PANEL
Cholesterol: 151 mg/dL (ref <200)
HDL: 58 mg/dL (ref >40)
LDL CHOLESTEROL (CALC): 79 mg/dL
Non-HDL Cholesterol (Calc): 93 mg/dL (calc) (ref <130)
Total CHOL/HDL Ratio: 2.6 (calc) (ref <5.0)
Triglycerides: 61 mg/dL (ref <150)

## 2018-02-02 LAB — COMPLETE METABOLIC PANEL WITH GFR
AG RATIO: 2 (calc) (ref 1.0–2.5)
ALBUMIN MSPROF: 4.5 g/dL (ref 3.6–5.1)
ALKALINE PHOSPHATASE (APISO): 61 U/L (ref 40–115)
ALT: 30 U/L (ref 9–46)
AST: 59 U/L — ABNORMAL HIGH (ref 10–35)
BILIRUBIN TOTAL: 0.8 mg/dL (ref 0.2–1.2)
BUN / CREAT RATIO: 22 (calc) (ref 6–22)
BUN: 26 mg/dL — ABNORMAL HIGH (ref 7–25)
CO2: 30 mmol/L (ref 20–32)
CREATININE: 1.2 mg/dL — AB (ref 0.70–1.18)
Calcium: 10.4 mg/dL — ABNORMAL HIGH (ref 8.6–10.3)
Chloride: 101 mmol/L (ref 98–110)
GFR, Est African American: 67 mL/min/{1.73_m2} (ref >=60)
GFR, Est Non African American: 58 mL/min/{1.73_m2} — ABNORMAL LOW (ref >=60)
GLOBULIN: 2.3 g/dL (ref 1.9–3.7)
Glucose, Bld: 105 mg/dL — ABNORMAL HIGH (ref 65–99)
POTASSIUM: 5.2 mmol/L (ref 3.5–5.3)
SODIUM: 138 mmol/L (ref 135–146)
Total Protein: 6.8 g/dL (ref 6.1–8.1)

## 2018-02-02 LAB — CBC WITH DIFFERENTIAL/PLATELET
BASOS PCT: 0.5 %
Basophils Absolute: 41 cells/uL (ref 0–200)
Eosinophils Absolute: 90 cells/uL (ref 15–500)
Eosinophils Relative: 1.1 %
HCT: 39.6 % (ref 38.5–50.0)
Hemoglobin: 13.2 g/dL (ref 13.2–17.1)
Lymphs Abs: 2001 cells/uL (ref 850–3900)
MCH: 31.4 pg (ref 27.0–33.0)
MCHC: 33.3 g/dL (ref 32.0–36.0)
MCV: 94.1 fL (ref 80.0–100.0)
MONOS PCT: 9.1 %
MPV: 10.2 fL (ref 7.5–12.5)
Neutro Abs: 5322 cells/uL (ref 1500–7800)
Neutrophils Relative %: 64.9 %
PLATELETS: 208 10*3/uL (ref 140–400)
RBC: 4.21 10*6/uL (ref 4.20–5.80)
RDW: 12.6 % (ref 11.0–15.0)
TOTAL LYMPHOCYTE: 24.4 %
WBC: 8.2 10*3/uL (ref 3.8–10.8)
WBCMIX: 746 {cells}/uL (ref 200–950)

## 2018-02-02 LAB — VITAMIN D 25 HYDROXY (VIT D DEFICIENCY, FRACTURES): VIT D 25 HYDROXY: 89 ng/mL (ref 30–100)

## 2018-02-02 LAB — INSULIN, RANDOM: Insulin: 8.8 u[IU]/mL (ref 2.0–19.6)

## 2018-02-02 LAB — HEMOGLOBIN A1C
EAG (MMOL/L): 6.3 (calc)
HEMOGLOBIN A1C: 5.6 %{Hb} (ref <5.7)
Mean Plasma Glucose: 114 (calc)

## 2018-02-02 LAB — TSH: TSH: 1.12 mIU/L (ref 0.40–4.50)

## 2018-02-02 LAB — MAGNESIUM: MAGNESIUM: 1.9 mg/dL (ref 1.5–2.5)

## 2018-02-02 LAB — URIC ACID: Uric Acid, Serum: 7.1 mg/dL (ref 4.0–8.0)

## 2018-02-19 DIAGNOSIS — N401 Enlarged prostate with lower urinary tract symptoms: Secondary | ICD-10-CM | POA: Diagnosis not present

## 2018-02-19 DIAGNOSIS — R351 Nocturia: Secondary | ICD-10-CM | POA: Diagnosis not present

## 2018-02-19 DIAGNOSIS — N200 Calculus of kidney: Secondary | ICD-10-CM | POA: Diagnosis not present

## 2018-02-26 ENCOUNTER — Other Ambulatory Visit: Payer: Self-pay | Admitting: Internal Medicine

## 2018-02-26 DIAGNOSIS — I10 Essential (primary) hypertension: Secondary | ICD-10-CM

## 2018-02-28 DIAGNOSIS — L821 Other seborrheic keratosis: Secondary | ICD-10-CM | POA: Diagnosis not present

## 2018-02-28 DIAGNOSIS — L570 Actinic keratosis: Secondary | ICD-10-CM | POA: Diagnosis not present

## 2018-02-28 DIAGNOSIS — D225 Melanocytic nevi of trunk: Secondary | ICD-10-CM | POA: Diagnosis not present

## 2018-02-28 DIAGNOSIS — Z23 Encounter for immunization: Secondary | ICD-10-CM | POA: Diagnosis not present

## 2018-02-28 DIAGNOSIS — L814 Other melanin hyperpigmentation: Secondary | ICD-10-CM | POA: Diagnosis not present

## 2018-03-12 ENCOUNTER — Other Ambulatory Visit: Payer: Self-pay | Admitting: Physician Assistant

## 2018-05-07 ENCOUNTER — Other Ambulatory Visit: Payer: Self-pay | Admitting: Internal Medicine

## 2018-05-07 DIAGNOSIS — I10 Essential (primary) hypertension: Secondary | ICD-10-CM

## 2018-05-13 DIAGNOSIS — M109 Gout, unspecified: Secondary | ICD-10-CM | POA: Insufficient documentation

## 2018-05-13 NOTE — Progress Notes (Addendum)
MEDICARE ANNUAL WELLNESS VISIT AND FOLLOW UP Assessment:  Michael Waller was seen today for medicare wellness and follow-up.  Diagnoses and all orders for this visit:  Medicare annual wellness visit, subsequent Complete yearly  Essential hypertension Continue medication: Monitor blood pressure at home; call if consistently over 130/80 Continue DASH diet.   Reminder to go to the ER if any CP, SOB, nausea, dizziness, severe HA, changes vision/speech, left arm numbness and tingling and jaw pain. -     CBC with Differential/Platelet -     COMPLETE METABOLIC PANEL WITH GFR -     Magnesium  Benign prostatic hyperplasia with lower urinary tract symptoms, symptom details unspecified Doing well at this time, no changes Taking Cardura 44m daily Follows with Urology yearly  Mixed hyperlipidemia Doing well Discussed dietary and exercise modifications Continue Lipitor 429mat bedtime -     Lipid panel  Hyperuricemia Will check uric acid today  Gastroesophageal reflux disease without esophagitis -     Magnesium -     omeprazole (PRILOSEC) 20 MG capsule; Take 1 capsule (20 mg total) by mouth daily.  Idiopathic gout, unspecified chronicity, unspecified site No flares noted Will check uric acid Continue allopurinal 1/2 tablet daily  Seasonal allergic rhinitis due to pollen -     cetirizine (ZYRTEC) 10 MG tablet; Take 1 tablet (10 mg total) by mouth daily.  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  Canal clear, TM intact. Ear hygiene discussed.  Medication management -     CBC with Differential/Platelet -     COMPLETE METABOLIC PANEL WITH GFR -     Magnesium -     Lipid panel -     Hemoglobin A1c -     Insulin, random -     VITAMIN D 25 Hydroxy (Vit-D Deficiency,  Fractures) -     TSH -     Uric acid   Over 30 minutes of exam, counseling, chart review, and critical decision making was performed  Future Appointments  Date Time Provider De[place]3/25/2020  9:00 AM McUnk PintoMD [facility] None     Plan:   During the course of the visit the patient was educated and counseled about appropriate screening and preventive services including:    Pneumococcal vaccine   Influenza vaccine  Prevnar 13  Td vaccine  Screening electrocardiogram  Colorectal cancer screening  Diabetes screening  Glaucoma screening  Nutrition counseling    Subjective:  Michael Waller a [age].o. presents for medicare annual welllnesss and 3 month follow up on hypertension, HLD, pre-diabetes, weight and vitamin D deficiency,GERD and gout controlled by meds.  Reports that he checks his blood pressure every once in awhile and it is in the normal range. He was taking Zantac daily and recently switched related to recall.  Reports that this is working better than the zantac and he would like a prescription.     His blood pressure has been controlled at home, today their BP is BP: 140/72 He does not workout. He denies chest pain, shortness of breath, dizziness.  He is on cholesterol medication and denies myalgias. His cholesterol is at goal. The cholesterol last visit was:   Lab Results  Component Value Date  CHOL 151 02/01/2018   HDL 58 02/01/2018   LDLCALC 79 02/01/2018   TRIG 61 02/01/2018   CHOLHDL 2.6 02/01/2018   He has not been working on diet and exercise for prediabetes, and denies hyperglycemia, hypoglycemia , nausea, polydipsia, polyuria and vomiting. Last A1C in the office was:  Lab Results  Component Value Date   HGBA1C 5.6 02/01/2018   Last GFR Lab Results  Component Value Date   GFRNONAA 58 (L) 02/01/2018     Lab Results  Component Value Date   GFRAA 67 02/01/2018   Patient is on Vitamin D supplement.   Lab Results   Component Value Date   VD25OH 89 02/01/2018      Medication Review:    Current Outpatient Medications (Cardiovascular):  .  atorvastatin (LIPITOR) 40 MG tablet, TAKE 1 TABLET BY MOUTH EACH NIGHT AT BEDTIME .  bisoprolol-hydrochlorothiazide (ZIAC) 10-6.25 MG tablet, TAKE 1 TABLET BY MOUTH EVERY MORNING FORBLOOD PRESSURE .  doxazosin (CARDURA) 8 MG tablet, TAKE 1 TABLET BY MOUTH DAILY .  enalapril (VASOTEC) 20 MG tablet, TAKE 1 TABLET BY MOUTH TWICE DAILY (EVERY MORNING AND EVENING) .  minoxidil (LONITEN) 2.5 MG tablet, TAKE 1-2 TABLETS BY MOUTH IN THE [facility] BLOOD PRESSURE   Current Outpatient Medications (Respiratory):  .  cetirizine (ZYRTEC) 10 MG tablet, Take 1 tablet (10 mg total) by mouth daily.   Current Outpatient Medications (Analgesics):  .  allopurinol (ZYLOPRIM) 300 MG tablet, TAKE 1 TABLET BY MOUTH DAILY .  aspirin 81 MG tablet, Take 81 mg by mouth daily.     Current Outpatient Medications (Other):  Marland Kitchen  Alfalfa 650 MG TABS, Take 2 tablets by mouth daily. .  blood glucose meter kit and supplies, Dispense based insurance preference. E11.9 .  Cholecalciferol (VITAMIN D3) 2000 units TABS, Take 3 tablets by mouth daily. .  Cinnamon 500 MG TABS, Take 2 tablets by mouth daily. .  Famotidine (PEPCID PO), Take by mouth. .  finasteride (PROSCAR) 5 MG tablet, Take 5 mg by mouth every evening.  .  Garlic 3888 MG CAPS, Take 1,000 mg by mouth daily. Marland Kitchen  glucose blood (ONE TOUCH ULTRA TEST) test strip, USE TO CHECK BLOOD SUGAR ONCE DAILY .  glucose blood test strip, Check blood sugar once daily .  Magnesium 250 MG TABS, Take 250 mg by mouth 2 (two) times daily. Breakfast and lunch .  Multiple Vitamin (MULTIVITAMIN) tablet, Take 1 tablet by mouth daily. .  Omega-3 Fatty Acids (OMEGA-3 FISH OIL) 1200 MG CAPS, Take 1 capsule by mouth daily with lunch. Marland Kitchen  omeprazole (PRILOSEC) 20 MG capsule, Take 1 capsule (20 mg total) by mouth daily.  Current Facility-Administered Medications  (Other):  .  0.9 %  sodium chloride infusion  Allergies: Allergies  Allergen Reactions  . Adhesive [Tape] Other (See Comments)    reddness    Current Problems (verified) has Essential hypertension; Mixed hyperlipidemia; Prediabetes; Vitamin D deficiency; Benign prostatic hyperplasia; Hyperuricemia; Medication management; and Gout on their problem list.  Screening Tests Immunization History  Administered Date(s) Administered  . DTaP 08/31/2005  . PPD Test 03/17/2014  . Td 07/26/2017    Preventative care: Last colonoscopy: 2019  Prior vaccinations: TD or Tdap: 2019 Influenza: Declined Pneumococcal: Declined Prevnar13: Declined Shingles/Zostavax: Declined  Names of Other Physician/Practitioners you currently use: 1. [facility] here for primary care 2.Eye Exam- Due Jan 3.Dentist, Due related to insurance, scheduled 2020.  Patient Care Team: Unk Pinto, MD as PCP - General (  Internal Medicine) Renda Rolls, Jennefer Bravo, MD as Referring Physician (Dermatology) Gatha Mayer, MD as Consulting Physician (Gastroenterology) Irine Seal, MD as Attending Physician (Urology) Marica Otter, [place] (Optometry)  Surgical: He  has a past surgical history that includes Prostate biopsy (2000, 12/2004); Extracorporeal shock wave lithotripsy [phone]); Hand ligament reconstruction (Right, 2009); Cystoscopy with retrograde pyelogram, ureteroscopy and stent placement (Bilateral, 04/19/2016); and Colonoscopy (2008). Family His family history includes Breast cancer in his sister; Cancer in his sister and sister; Colon cancer in his sister; Diabetes in his brother, brother, brother, father, mother, and sister; Heart attack in his father; Heart disease in his brother, father, and mother; Pancreatic cancer in his brother; Stroke in his brother. Social history  He reports that he quit smoking about 53 years ago. His smoking use included cigarettes. He  quit after 5.00 years of use. He quit smokeless tobacco use about 52 years ago.  His smokeless tobacco use included chew. He reports that he does not drink alcohol or use drugs.  MEDICARE WELLNESS OBJECTIVES: Physical activity: Current Exercise Habits: The patient does not participate in regular exercise at present, Exercise limited by: None identified Cardiac risk factors: Cardiac Risk Factors include: advanced age (>24mn, >>15women);dyslipidemia;hypertension;male gender Depression/mood screen:   [facility] 05/14/2018  Decreased Interest 0  Down, Depressed, Hopeless 0  PHQ - 2 Score 0  Altered sleeping 0  Tired, decreased energy 0  Change in appetite 0  Feeling bad or failure about yourself  0  Trouble concentrating 0  Moving slowly or fidgety/restless 0  Suicidal thoughts 0  PHQ-9 Score 0  Difficult doing work/chores Not difficult at all    ADLs:  In your present state of health, do you have any difficulty performing the following activities: 05/14/2018 02/01/2018  Hearing? Y N  Vision? N N  Difficulty concentrating or making decisions? N N  Walking or climbing stairs? N N  Dressing or bathing? N N  Doing errands, shopping? N N  Preparing Food and eating ? N -  Using the Toilet? N -  In the past six months, have you accidently leaked urine? N -  Do you have problems with loss of bowel control? N -  Managing your Medications? N -  Managing your Finances? N -  Housekeeping or managing your Housekeeping? N -  Some recent data might be hidden     Cognitive Testing  Alert? Yes  Normal Appearance?Yes  Oriented to person? Yes  Place? Yes   Time? Yes  Recall of three objects?  Yes  Can perform simple calculations? Yes  Displays appropriate judgment?Yes  Can read the correct time from a watch face?Yes  EOL planning: Does Patient Have a Medical Advance Directive?: No Would patient like information on creating a medical advance directive?: No - Patient  declined   Objective:   Today's Vitals   05/14/18 0946  BP: 140/72  Pulse: 80  Temp: 98.7 F (37.1 C)  SpO2: 98%  Weight: 206 lb 9.6 oz (93.7 kg)  Height: 6' 3.5" (1.918 m)   Body mass index is 25.48 kg/m.  General appearance: alert, no distress, WD/WN, male HEENT: normocephalic, sclerae anicteric, TMs pearly, nares patent, no discharge or erythema, pharynx normal Oral cavity: MMM, no lesions Neck: supple, no lymphadenopathy, no thyromegaly, no masses Heart: RRR, normal S1, S2, no murmurs Lungs: CTA bilaterally, no wheezes, rhonchi, or rales Abdomen: +bs, soft, non tender, non distended, no masses, no hepatomegaly, no splenomegaly Musculoskeletal: nontender, no swelling, no obvious  deformity Extremities: no edema, no cyanosis, no clubbing Pulses: 2+ symmetric, upper and lower extremities, normal cap refill Neurological: alert, oriented x 3, CN2-12 intact, strength normal upper extremities and lower extremities, sensation normal throughout, DTRs 2+ throughout, no cerebellar signs, gait normal Psychiatric: normal affect, behavior normal, pleasant   Medicare Attestation I have personally reviewed: The patient's medical and social history Their use of alcohol, tobacco or illicit drugs Their current medications and supplements The patient's functional ability including ADLs,fall risks, home safety risks, cognitive, and hearing and visual impairment Diet and physical activities Evidence for depression or mood disorders  The patient's weight, height, BMI, and visual acuity have been recorded in the chart.  I have made referrals, counseling, and provided education to the patient based on review of the above and I have provided the patient with a written personalized care plan for preventive services.     Garnet Sierras, NP   05/14/2018   1:21 PM [facility]

## 2018-05-14 ENCOUNTER — Encounter: Payer: Self-pay | Admitting: Adult Health Nurse Practitioner

## 2018-05-14 ENCOUNTER — Ambulatory Visit (INDEPENDENT_AMBULATORY_CARE_PROVIDER_SITE_OTHER): Payer: PPO | Admitting: Adult Health Nurse Practitioner

## 2018-05-14 ENCOUNTER — Ambulatory Visit: Payer: Self-pay | Admitting: Physician Assistant

## 2018-05-14 VITALS — BP 140/72 | HR 80 | Temp 98.7°F | Ht 75.5 in | Wt 206.6 lb

## 2018-05-14 DIAGNOSIS — H9071 Mixed conductive and sensorineural hearing loss, unilateral, right ear, with unrestricted hearing on the contralateral side: Secondary | ICD-10-CM | POA: Diagnosis not present

## 2018-05-14 DIAGNOSIS — I10 Essential (primary) hypertension: Secondary | ICD-10-CM | POA: Diagnosis not present

## 2018-05-14 DIAGNOSIS — M1000 Idiopathic gout, unspecified site: Secondary | ICD-10-CM | POA: Diagnosis not present

## 2018-05-14 DIAGNOSIS — E559 Vitamin D deficiency, unspecified: Secondary | ICD-10-CM | POA: Diagnosis not present

## 2018-05-14 DIAGNOSIS — R6889 Other general symptoms and signs: Secondary | ICD-10-CM | POA: Diagnosis not present

## 2018-05-14 DIAGNOSIS — Z0001 Encounter for general adult medical examination with abnormal findings: Secondary | ICD-10-CM

## 2018-05-14 DIAGNOSIS — E782 Mixed hyperlipidemia: Secondary | ICD-10-CM | POA: Diagnosis not present

## 2018-05-14 DIAGNOSIS — N401 Enlarged prostate with lower urinary tract symptoms: Secondary | ICD-10-CM

## 2018-05-14 DIAGNOSIS — R7303 Prediabetes: Secondary | ICD-10-CM

## 2018-05-14 DIAGNOSIS — J301 Allergic rhinitis due to pollen: Secondary | ICD-10-CM | POA: Diagnosis not present

## 2018-05-14 DIAGNOSIS — Z79899 Other long term (current) drug therapy: Secondary | ICD-10-CM | POA: Diagnosis not present

## 2018-05-14 DIAGNOSIS — E790 Hyperuricemia without signs of inflammatory arthritis and tophaceous disease: Secondary | ICD-10-CM

## 2018-05-14 DIAGNOSIS — K219 Gastro-esophageal reflux disease without esophagitis: Secondary | ICD-10-CM | POA: Diagnosis not present

## 2018-05-14 DIAGNOSIS — Z0000 Encounter for general adult medical examination without abnormal findings: Secondary | ICD-10-CM

## 2018-05-14 MED ORDER — OMEPRAZOLE 20 MG PO CPDR: 20.0000 mg | DELAYED_RELEASE_CAPSULE | Freq: Every day | ORAL | 3 refills | Status: DC

## 2018-05-14 MED ORDER — CETIRIZINE HCL 10 MG PO TABS: 10.0000 mg | ORAL_TABLET | Freq: Every day | ORAL | 3 refills | Status: DC

## 2018-05-14 NOTE — Patient Instructions (Addendum)
For free hearing evaluation you can go to [facility].  You do not have to be a member to do this.  We sent in Prilosec and zyrtec prescription for you  You are up to date on all of your heath screenings.  Today we flushed your right ear.  Use a dropper or use a cap to put peroxide in ears every two weeks.  Let peroxide sit in ear for 3-41min.  Make sure this is warm or room temperature.  Cold into your ears will make you nauseated or dizzy.    Then use olive oil, or mineral oil 2-4 drops in the effected ear and for 2-3 days after.  Use cotton ball.  Can do before going to bed.  After 3 days You can use a syringe or baby bulb syringe with warm water to flush out your ears.    Michael Waller , Thank you for taking time to come for your Medicare Wellness Visit. I appreciate your ongoing commitment to your health goals. Please review the following plan we discussed and let me know if I can assist you in the future.  This is a list of the screening recommended for you and due dates:  Health Maintenance  Topic Date Due  . Tetanus Vaccine  07/27/2027  . Flu Shot  Declined  . Pneumonia vaccines  Declined     We Do NOT Approve of  [facility], [employer]  Our Patients  To Do Home Visits  & We Do NOT Approve of [facility] > > > > > > > > > > > > > > > > > > > > > > > > > > > > > > > > > > >  > > > >   Preventive Care for Adults  A healthy lifestyle and preventive care can promote health and wellness. Preventive health guidelines for men include the following key practices:  A routine yearly physical is a good way to check with your health care provider about your health and preventative screening. It is a chance to share any concerns and updates on your health and to receive a thorough exam.  Visit your dentist for a routine exam and preventative care every 6 months. Brush your teeth twice a day and floss once a day. Good oral hygiene prevents tooth decay and gum  disease.  The frequency of eye exams is based on your age, health, family medical history, use of contact lenses, and other factors. Follow your health care provider's recommendations for frequency of eye exams.  Eat a healthy diet. Foods such as vegetables, fruits, whole grains, low-fat dairy products, and lean protein foods contain the nutrients you need without too many calories. Decrease your intake of foods high in solid fats, added sugars, and salt. Eat the right amount of calories for you. Get information about a proper diet from your health care provider, if necessary.  Regular physical exercise is one of the most important things you can do for your health. Most adults should get at least 150 minutes of moderate-intensity exercise (any activity that increases your heart rate and causes you to sweat) each week. In addition, most adults need muscle-strengthening exercises on 2 or more days a week.  Maintain a healthy weight. The body mass index (BMI) is a screening tool to identify possible weight problems. It provides an estimate of body fat based on height and weight. Your health care provider can find your BMI and can help you  achieve or maintain a healthy weight. For adults 20 years and older:  A BMI below 18.5 is considered underweight.  A BMI of 18.5 to 24.9 is normal.  A BMI of 25 to 29.9 is considered overweight.  A BMI of 30 and above is considered obese.  Maintain normal blood lipids and cholesterol levels by exercising and minimizing your intake of saturated fat. Eat a balanced diet with plenty of fruit and vegetables. Blood tests for lipids and cholesterol should begin at age 92 and be repeated every 5 years. If your lipid or cholesterol levels are high, you are over 50, or you are at high risk for heart disease, you may need your cholesterol levels checked more frequently. Ongoing high lipid and cholesterol levels should be treated with medicines if diet and exercise are not  working.  If you smoke, find out from your health care provider how to quit. If you do not use tobacco, do not start.  Lung cancer screening is recommended for adults aged 31-80 years who are at high risk for developing lung cancer because of a history of smoking. A yearly low-dose CT scan of the lungs is recommended for people who have at least a 30-pack-year history of smoking and are a current smoker or have quit within the past 15 years. A pack year of smoking is smoking an average of 1 pack of cigarettes a day for 1 year (for example: 1 pack a day for 30 years or 2 packs a day for 15 years). Yearly screening should continue until the smoker has stopped smoking for at least 15 years. Yearly screening should be stopped for people who develop a health problem that would prevent them from having lung cancer treatment.  If you choose to drink alcohol, do not have more than 2 drinks per day. One drink is considered to be 12 ounces (355 mL) of beer, 5 ounces (148 mL) of wine, or 1.5 ounces (44 mL) of liquor.  Avoid use of street drugs. Do not share needles with anyone. Ask for help if you need support or instructions about stopping the use of drugs.  High blood pressure causes heart disease and increases the risk of stroke. Your blood pressure should be checked at least every 1-2 years. Ongoing high blood pressure should be treated with medicines, if weight loss and exercise are not effective.  If you are 25-82 years old, ask your health care provider if you should take aspirin to prevent heart disease.  Diabetes screening involves taking a blood sample to check your fasting blood sugar level. Testing should be considered at a younger age or be carried out more frequently if you are overweight and have at least 1 risk factor for diabetes.  Colorectal cancer can be detected and often prevented. Most routine colorectal cancer screening begins at the age of 4 and continues through age 29. However, your  health care provider may recommend screening at an earlier age if you have risk factors for colon cancer. On a yearly basis, your health care provider may provide home test kits to check for hidden blood in the stool. Use of a small camera at the end of a tube to directly examine the colon (sigmoidoscopy or colonoscopy) can detect the earliest forms of colorectal cancer. Talk to your health care provider about this at age 62, when routine screening begins. Direct exam of the colon should be repeated every 5-10 years through age 50, unless early forms of precancerous polyps or small  growths are found.  Hepatitis C blood testing is recommended for all people born from [birth year] through 1965 and any individual with known risks for hepatitis C.  Screening for abdominal aortic aneurysm (AAA)  by ultrasound is recommended for people who have history of high blood pressure or who are current or former smokers.  Healthy men should  receive prostate-specific antigen (PSA) blood tests as part of routine cancer screening. Talk with your health care provider about prostate cancer screening.  Testicular cancer screening is  recommended for adult males. Screening includes self-exam, a health care provider exam, and other screening tests. Consult with your health care provider about any symptoms you have or any concerns you have about testicular cancer.  Use sunscreen. Apply sunscreen liberally and repeatedly throughout the day. You should seek shade when your shadow is shorter than you. Protect yourself by wearing long sleeves, pants, a wide-brimmed hat, and sunglasses year round, whenever you are outdoors.  Once a month, do a whole-body skin exam, using a mirror to look at the skin on your back. Tell your health care provider about new moles, moles that have irregular borders, moles that are larger than a pencil eraser, or moles that have changed in shape or color.  Stay current with required vaccines  (immunizations).  Influenza vaccine. All adults should be immunized every year.  Tetanus, diphtheria, and acellular pertussis (Td, Tdap) vaccine. An adult who has not previously received Tdap or who does not know his vaccine status should receive 1 dose of Tdap. This initial dose should be followed by tetanus and diphtheria toxoids (Td) booster doses every 10 years. Adults with an unknown or incomplete history of completing a 3-dose immunization series with Td-containing vaccines should begin or complete a primary immunization series including a Tdap dose. Adults should receive a Td booster every 10 years.  Zoster vaccine. One dose is recommended for adults aged 42 years or older unless certain conditions are present.    PREVNAR - Pneumococcal 13-valent conjugate (PCV13) vaccine. When indicated, a person who is uncertain of his immunization history and has no record of immunization should receive the PCV13 vaccine. An adult aged 74 years or older who has certain medical conditions and has not been previously immunized should receive 1 dose of PCV13 vaccine. This PCV13 should be followed with a dose of pneumococcal polysaccharide (PPSV23) vaccine. The PPSV23 vaccine dose should be obtained 1 or more year(s)after the dose of PCV13 vaccine. An adult aged 88 years or older who has certain medical conditions and previously received 1 or more doses of PPSV23 vaccine should receive 1 dose of PCV13. The PCV13 vaccine dose should be obtained 1 or more years after the last PPSV23 vaccine dose.    PNEUMOVAX - Pneumococcal polysaccharide (PPSV23) vaccine. When PCV13 is also indicated, PCV13 should be obtained first. All adults aged 15 years and older should be immunized. An adult younger than age 66 years who has certain medical conditions should be immunized. Any person who resides in a nursing home or long-term care facility should be immunized. An adult smoker should be immunized. People with an  immunocompromised condition and certain other conditions should receive both PCV13 and PPSV23 vaccines. People with human immunodeficiency virus (HIV) infection should be immunized as soon as possible after diagnosis. Immunization during chemotherapy or radiation therapy should be avoided. Routine use of PPSV23 vaccine is not recommended for American Indians, [place]atives, or people younger than 65 years unless there are medical conditions that require PPSV23  vaccine. When indicated, people who have unknown immunization and have no record of immunization should receive PPSV23 vaccine. One-time revaccination 5 years after the first dose of PPSV23 is recommended for people aged 19-64 years who have chronic kidney failure, nephrotic syndrome, asplenia, or immunocompromised conditions. People who received 1-2 doses of PPSV23 before age 43 years should receive another dose of PPSV23 vaccine at age 65 years or later if at least 5 years have passed since the previous dose. Doses of PPSV23 are not needed for people immunized with PPSV23 at or after age 66 years.    Hepatitis A vaccine. Adults who wish to be protected from this disease, have certain high-risk conditions, work with hepatitis A-infected animals, work in hepatitis A research labs, or travel to or work in countries with a high rate of hepatitis A should be immunized. Adults who were previously unvaccinated and who anticipate close contact with an international adoptee during the first 60 days after arrival in the [place] from a country with a high rate of hepatitis A should be immunized.    Hepatitis B vaccine. Adults should be immunized if they wish to be protected from this disease, have certain high-risk conditions, may be exposed to blood or other infectious body fluids, are household contacts or sex partners of hepatitis B positive people, are clients or workers in certain care facilities, or travel to or work in countries with a high rate  of hepatitis B.   Preventive Service / Frequency   Ages 25 and over  Blood pressure check.  Lipid and cholesterol check.  Lung cancer screening. / Every year if you are aged 30-80 years and have a 30-pack-year history of smoking and currently smoke or have quit within the past 15 years. Yearly screening is stopped once you have quit smoking for at least 15 years or develop a health problem that would prevent you from having lung cancer treatment.  Fecal occult blood test (FOBT) of stool. You may not have to do this test if you get a colonoscopy every 10 years.  Flexible sigmoidoscopy** or colonoscopy.** / Every 5 years for a flexible sigmoidoscopy or every 10 years for a colonoscopy beginning at age 12 and continuing until age 42.  Hepatitis C blood test.** / For all people born from [birth year] through 1965 and any individual with known risks for hepatitis C.  Abdominal aortic aneurysm (AAA) screening./ Screening current or former smokers or have Hypertension.  Skin self-exam. / Monthly.  Influenza vaccine. / Every year.  Tetanus, diphtheria, and acellular pertussis (Tdap/Td) vaccine.** / 1 dose of Td every 10 years.   Zoster vaccine.** / 1 dose for adults aged 71 years or older.         Pneumococcal 13-valent conjugate (PCV13) vaccine.    Pneumococcal polysaccharide (PPSV23) vaccine.     Hepatitis A vaccine.** / Consult your health care provider.  Hepatitis B vaccine.** / Consult your health care provider. Screening for abdominal aortic aneurysm (AAA)  by ultrasound is recommended for people who have history of high blood pressure or who are current or former smokers. ++++++++++ Recommend Adult Low Dose Aspirin or  coated  Aspirin 81 mg daily  To reduce risk of Colon Cancer 20 %,  Skin Cancer 26 % ,  Malignant Melanoma 46%  and  Pancreatic cancer 60% ++++++++++++++++++++++ Vitamin D goal  is between 70-100.  Please make sure that you are taking your Vitamin D as  directed.  It is very important as a natural  anti-inflammatory  helping hair, skin, and nails, as well as reducing stroke and heart attack risk.  It helps your bones and helps with mood. It also decreases numerous cancer risks so please take it as directed.  Low Vit D is associated with a 200-300% higher risk for CANCER  and 200-300% higher risk for HEART   ATTACK  &  STROKE.   .....................................Marland Kitchen It is also associated with higher death rate at younger ages,  autoimmune diseases like Rheumatoid arthritis, Lupus, Multiple Sclerosis.    Also many other serious conditions, like depression, Alzheimer's Dementia, infertility, muscle aches, fatigue, fibromyalgia - just to name a few. ++++++++++++++++++++++ Recommend the book "The END of DIETING" by Dr Excell Seltzer  & the book "The END of DIABETES " by Dr Excell Seltzer At [email or web address] - get book & Audio CD's    Being diabetic has a  300% increased risk for heart attack, stroke, cancer, and alzheimer- type vascular dementia. It is very important that you work harder with diet by avoiding all foods that are white. Avoid white rice (brown & wild rice is OK), white potatoes (sweetpotatoes in moderation is OK), White bread or wheat bread or anything made out of white flour like bagels, donuts, rolls, buns, biscuits, cakes, pastries, cookies, pizza crust, and pasta (made from white flour & egg whites) - vegetarian pasta or spinach or wheat pasta is OK. Multigrain breads like Arnold's or Pepperidge [employer], or multigrain sandwich thins or flatbreads.  Diet, exercise and weight loss can reverse and cure diabetes in the early stages.  Diet, exercise and weight loss is very important in the control and prevention of complications of diabetes which affects every system in your body, ie. Brain - dementia/stroke, eyes - glaucoma/blindness, heart - heart attack/heart failure, kidneys - dialysis, stomach - gastric paralysis, intestines - malabsorption, nerves  - severe painful neuritis, circulation - gangrene & loss of a leg(s), and finally cancer and Alzheimers.    I recommend avoid fried & greasy foods,  sweets/candy, white rice (brown or wild rice or Quinoa is OK), white potatoes (sweet potatoes are OK) - anything made from white flour - bagels, doughnuts, rolls, buns, biscuits,white and wheat breads, pizza crust and traditional pasta made of white flour & egg white(vegetarian pasta or spinach or wheat pasta is OK).  Multi-grain bread is OK - like multi-grain flat bread or sandwich thins. Avoid alcohol in excess. Exercise is also important.    Eat all the vegetables you want - avoid meat, especially red meat and dairy - especially cheese.  Cheese is the most concentrated form of trans-fats which is the worst thing to clog up our arteries. Veggie cheese is OK which can be found in the fresh produce section at [employer] or [employer] or [employer]  ++++++++++++++++++++++ DASH Eating Plan  DASH stands for "Dietary Approaches to Stop Hypertension."   The DASH eating plan is a healthy eating plan that has been shown to reduce high blood pressure (hypertension). Additional health benefits may include reducing the risk of type 2 diabetes mellitus, heart disease, and stroke. The DASH eating plan may also help with weight loss. WHAT DO I NEED TO KNOW ABOUT THE DASH EATING PLAN? For the DASH eating plan, you will follow these general guidelines:  Choose foods with a percent daily value for sodium of less than 5% (as listed on the food label).  Use salt-free seasonings or herbs instead of table salt or sea salt.  Check with your health care provider  or pharmacist before using salt substitutes.  Eat lower-sodium products, often labeled as "lower sodium" or "no salt added."  Eat fresh foods.  Eat more vegetables, fruits, and low-fat dairy products.  Choose whole grains. Look for the word "whole" as the first word in the ingredient list.  Choose fish    Limit sweets, desserts, sugars, and sugary drinks.  Choose heart-healthy fats.  Eat veggie cheese   Eat more home-cooked food and less restaurant, buffet, and fast food.  Limit fried foods.  Cook foods using methods other than frying.  Limit canned vegetables. If you do use them, rinse them well to decrease the sodium.  When eating at a restaurant, ask that your food be prepared with less salt, or no salt if possible.                      WHAT FOODS CAN I EAT? Read Dr Fara Olden Fuhrman's books on The End of Dieting & The End of Diabetes  Grains Whole grain or whole wheat bread. Brown rice. Whole grain or whole wheat pasta. Quinoa, bulgur, and whole grain cereals. Low-sodium cereals. Corn or whole wheat flour tortillas. Whole grain cornbread. Whole grain crackers. Low-sodium crackers.  Vegetables Fresh or frozen vegetables (raw, steamed, roasted, or grilled). Low-sodium or reduced-sodium tomato and vegetable juices. Low-sodium or reduced-sodium tomato sauce and paste. Low-sodium or reduced-sodium canned vegetables.   Fruits All fresh, canned (in natural juice), or frozen fruits.  Protein Products  All fish and seafood.  Dried beans, peas, or lentils. Unsalted nuts and seeds. Unsalted canned beans.  Dairy Low-fat dairy products, such as skim or 1% milk, 2% or reduced-fat cheeses, low-fat ricotta or cottage cheese, or plain low-fat yogurt. Low-sodium or reduced-sodium cheeses.  Fats and Oils Tub margarines without trans fats. Light or reduced-fat mayonnaise and salad dressings (reduced sodium). Avocado. Safflower, olive, or canola oils. Natural peanut or almond butter.  Other Unsalted popcorn and pretzels. The items listed above may not be a complete list of recommended foods or beverages. Contact your dietitian for more options.  ++++++++++++++++++++  WHAT FOODS ARE NOT RECOMMENDED? Grains/ White flour or wheat flour White bread. White pasta. White rice. Refined cornbread.  Bagels and croissants. Crackers that contain trans fat.  Vegetables  Creamed or fried vegetables. Vegetables in a . Regular canned vegetables. Regular canned tomato sauce and paste. Regular tomato and vegetable juices.  Fruits Dried fruits. Canned fruit in light or heavy syrup. Fruit juice.  Meat and Other Protein Products Meat in general - RED meat & White meat.  Fatty cuts of meat. Ribs, chicken wings, all processed meats as bacon, sausage, bologna, salami, fatback, hot dogs, bratwurst and packaged luncheon meats.  Dairy Whole or 2% milk, cream, half-and-half, and cream cheese. Whole-fat or sweetened yogurt. Full-fat cheeses or blue cheese. Non-dairy creamers and whipped toppings. Processed cheese, cheese spreads, or cheese curds.  Condiments Onion and garlic salt, seasoned salt, table salt, and sea salt. Canned and packaged gravies. Worcestershire sauce. Tartar sauce. Barbecue sauce. Teriyaki sauce. Soy sauce, including reduced sodium. Steak sauce. Fish sauce. Oyster sauce. Cocktail sauce. Horseradish. Ketchup and mustard. Meat flavorings and tenderizers. Bouillon cubes. Hot sauce. Tabasco sauce. Marinades. Taco seasonings. Relishes.  Fats and Oils Butter, stick margarine, lard, shortening and bacon fat. Coconut, palm kernel, or palm oils. Regular salad dressings.  Pickles and olives. Salted popcorn and pretzels.  The items listed above may not be a complete list of foods and beverages to avoid.

## 2018-05-15 LAB — COMPLETE METABOLIC PANEL WITH GFR
AG Ratio: 1.8 (calc) (ref 1.0–2.5)
ALT: 22 U/L (ref 9–46)
AST: 21 U/L (ref 10–35)
Alkaline phosphatase (APISO): 82 U/L (ref 40–115)
CO2: 30 mmol/L (ref 20–32)
Calcium: 10 mg/dL (ref 8.6–10.3)
GFR, Est African American: 100 mL/min/{1.73_m2} (ref >=60)
GFR, Est Non African American: 86 mL/min/{1.73_m2} (ref >=60)
Potassium: 4.4 mmol/L (ref 3.5–5.3)
Sodium: 141 mmol/L (ref 135–146)

## 2018-05-15 LAB — CBC WITH DIFFERENTIAL/PLATELET
Absolute Monocytes: 562 cells/uL (ref 200–950)
Basophils Absolute: 29 cells/uL (ref 0–200)
Basophils Relative: 0.4 %
Eosinophils Absolute: 88 {cells}/uL (ref 15–500)
Eosinophils Relative: 1.2 %
HCT: 41.2 % (ref 38.5–50.0)
Hemoglobin: 14.6 g/dL (ref 13.2–17.1)
Lymphs Abs: 1628 cells/uL (ref 850–3900)
MCH: 32.6 pg (ref 27.0–33.0)
MCHC: 35.4 g/dL (ref 32.0–36.0)
MCV: 92 fL (ref 80.0–100.0)
MPV: 10.4 fL (ref 7.5–12.5)
Monocytes Relative: 7.7 %
Neutro Abs: 4993 {cells}/uL (ref 1500–7800)
Neutrophils Relative %: 68.4 %
Platelets: 226 10*3/uL (ref 140–400)
RBC: 4.48 10*6/uL (ref 4.20–5.80)
RDW: 12.5 % (ref 11.0–15.0)
Total Lymphocyte: 22.3 %
WBC: 7.3 Thousand/uL (ref 3.8–10.8)

## 2018-05-15 LAB — COMPLETE METABOLIC PANEL WITHOUT GFR
Albumin: 4.4 g/dL (ref 3.6–5.1)
BUN: 18 mg/dL (ref 7–25)
Chloride: 103 mmol/L (ref 98–110)
Creat: 0.8 mg/dL (ref 0.70–1.18)
Globulin: 2.4 g/dL (ref 1.9–3.7)
Glucose, Bld: 105 mg/dL — ABNORMAL HIGH (ref 65–99)
Total Bilirubin: 0.6 mg/dL (ref 0.2–1.2)
Total Protein: 6.8 g/dL (ref 6.1–8.1)

## 2018-05-15 LAB — LIPID PANEL
Cholesterol: 152 mg/dL (ref <200)
HDL: 50 mg/dL (ref >40)
LDL Cholesterol (Calc): 87 mg/dL (calc)
Non-HDL Cholesterol (Calc): 102 mg/dL (calc) (ref <130)
Total CHOL/HDL Ratio: 3 (calc) (ref <5.0)
Triglycerides: 65 mg/dL (ref <150)

## 2018-05-15 LAB — VITAMIN D 25 HYDROXY (VIT D DEFICIENCY, FRACTURES): Vit D, 25-Hydroxy: 94 ng/mL (ref 30–100)

## 2018-05-15 LAB — URIC ACID: Uric Acid, Serum: 6 mg/dL (ref 4.0–8.0)

## 2018-05-15 LAB — HEMOGLOBIN A1C
Hgb A1c MFr Bld: 5.5 % of total Hgb (ref <5.7)
Mean Plasma Glucose: 111 (calc)
eAG (mmol/L): 6.2 (calc)

## 2018-05-15 LAB — MAGNESIUM: Magnesium: 2 mg/dL (ref 1.5–2.5)

## 2018-05-15 LAB — INSULIN, RANDOM: Insulin: 5.6 u[IU]/mL (ref 2.0–19.6)

## 2018-05-15 LAB — TSH: TSH: 1.19 m[IU]/L (ref 0.40–4.50)

## 2018-05-28 ENCOUNTER — Other Ambulatory Visit: Payer: Self-pay | Admitting: Internal Medicine

## 2018-06-11 ENCOUNTER — Other Ambulatory Visit: Payer: Self-pay | Admitting: Internal Medicine

## 2018-06-18 DIAGNOSIS — H40059 Ocular hypertension, unspecified eye: Secondary | ICD-10-CM | POA: Diagnosis not present

## 2018-06-18 DIAGNOSIS — H40019 Open angle with borderline findings, low risk, unspecified eye: Secondary | ICD-10-CM | POA: Diagnosis not present

## 2018-06-18 DIAGNOSIS — H524 Presbyopia: Secondary | ICD-10-CM | POA: Diagnosis not present

## 2018-06-18 DIAGNOSIS — E119 Type 2 diabetes mellitus without complications: Secondary | ICD-10-CM | POA: Diagnosis not present

## 2018-06-18 DIAGNOSIS — H5213 Myopia, bilateral: Secondary | ICD-10-CM | POA: Diagnosis not present

## 2018-06-18 DIAGNOSIS — H52223 Regular astigmatism, bilateral: Secondary | ICD-10-CM | POA: Diagnosis not present

## 2018-06-18 LAB — HM DIABETES EYE EXAM

## 2018-07-09 ENCOUNTER — Encounter: Payer: Self-pay | Admitting: Internal Medicine

## 2018-07-17 DIAGNOSIS — H40013 Open angle with borderline findings, low risk, bilateral: Secondary | ICD-10-CM | POA: Diagnosis not present

## 2018-07-17 DIAGNOSIS — H40053 Ocular hypertension, bilateral: Secondary | ICD-10-CM | POA: Diagnosis not present

## 2018-07-17 DIAGNOSIS — H5340 Unspecified visual field defects: Secondary | ICD-10-CM | POA: Diagnosis not present

## 2018-08-14 ENCOUNTER — Encounter: Payer: Self-pay | Admitting: Internal Medicine

## 2018-08-14 NOTE — Progress Notes (Signed)
Michael Waller   Michael Waller, M.D.     Michael Waller. Michael Waller, P.A.-C Michael Waller, [place]                [address] [place] [address] Telephone [phone] Telefax [phone] Annual  Screening/Preventative Visit  & Comprehensive Evaluation & Examination     This very nice 78 y.o. [name] presents for a Screening /Preventative Visit & comprehensive evaluation and management of multiple medical co-morbidities.  Patient has been followed for HTN, HLD, Prediabetes and Vitamin D Deficiency. Patient's Gout is controlled with his meds. Also he has GERD controlled on his meds. Patient has BPH / LUTS controlled on Doxazosin & Finasteride. Follows with Dr Michael Waller for kidney stones.      HTN predates circa 1986. Patient's BP has been controlled at home.  Today's BP is at goal - 140/62. Patient denies any cardiac symptoms as chest pain, palpitations, shortness of breath, dizziness or ankle swelling.     Patient's hyperlipidemia is controlled with diet and medications. Patient denies myalgias or other medication SE's. Last lipids were at goal: Lab Results  Component Value Date   CHOL 152 05/14/2018   HDL 50 05/14/2018   LDLCALC 87 05/14/2018   TRIG 65 05/14/2018   CHOLHDL 3.0 05/14/2018      Patient has hx/o prediabetes (A1c 5.8% / 2006)  and patient denies reactive hypoglycemic symptoms, visual blurring, diabetic polys or paresthesias. Last A1c was Normal & at goal:  Lab Results  Component Value Date   HGBA1C 5.5 05/14/2018       Finally, patient has history of Vitamin D Deficiency ("49" / on Tx-2008)  and last vitamin D was at goal: Lab Results  Component Value Date   VD25OH 94 05/14/2018   Current Outpatient Medications on File Prior to Visit  Medication Sig  . Alfalfa 650 MG TABS Take 2 tablets by mouth daily.  Marland Kitchen allopurinol (ZYLOPRIM) 300 MG tablet TAKE 1 TABLET BY MOUTH DAILY  . aspirin  81 MG tablet Take 81 mg by mouth daily.  Marland Kitchen atorvastatin (LIPITOR) 40 MG tablet TAKE 1 TABLET BY MOUTH EACH NIGHT AT BEDTIME  . bisoprolol-hydrochlorothiazide (ZIAC) 10-6.25 MG tablet TAKE 1 TABLET BY MOUTH EVERY MORNING FORBLOOD PRESSURE  . blood glucose meter kit and supplies Dispense based insurance preference. E11.9  . cetirizine (ZYRTEC) 10 MG tablet Take 1 tablet (10 mg total) by mouth daily.  . Cholecalciferol (VITAMIN D3) 2000 units TABS Take 3 tablets by mouth daily.  . Cinnamon 500 MG TABS Take 2 tablets by mouth daily.  Marland Kitchen doxazosin (CARDURA) 8 MG tablet TAKE 1 TABLET BY MOUTH DAILY  . enalapril (VASOTEC) 20 MG tablet TAKE 1 TABLET BY MOUTH TWICE DAILY (EVERY MORNING AND EVENING)  . Famotidine (PEPCID PO) Take by mouth.  . finasteride (PROSCAR) 5 MG tablet Take 5 mg by mouth every evening.   . Garlic 1856 MG CAPS Take 1,000 mg by mouth daily.  Marland Kitchen glucose blood (ONE TOUCH ULTRA TEST) test strip USE TO CHECK BLOOD SUGAR ONCE DAILY  . glucose blood test strip Check blood sugar once daily  . Magnesium 250 MG TABS Take 250 mg by mouth 2 (two) times daily. Breakfast and lunch  . minoxidil (LONITEN) 2.5 MG tablet TAKE 1-2 TABLETS BY MOUTH IN THE [facility] BLOOD PRESSURE  . Multiple Vitamin (MULTIVITAMIN) tablet Take 1  tablet by mouth daily.  . Omega-3 Fatty Acids (OMEGA-3 FISH OIL) 1200 MG CAPS Take 1 capsule by mouth daily with lunch.  Marland Kitchen omeprazole (PRILOSEC) 20 MG capsule Take 1 capsule (20 mg total) by mouth daily.   No current facility-administered medications on file prior to visit.    Allergies  Allergen Reactions  . Adhesive [Tape] Other (See Comments)    reddness   Past Medical History:  Diagnosis Date  . Benign localized prostatic hyperplasia with lower urinary tract symptoms (LUTS)   . Bruises easily   . Elevated PSA   . GERD (gastroesophageal reflux disease)   . History of gout   . History of kidney stones   . Hyperlipidemia   . Hypertension   . Nephrolithiasis   .  Nocturia   . Pre-diabetes   . Vitamin D deficiency   . Wears dentures    full upper and lower partial    Health Maintenance  Topic Date Due  . TETANUS/TDAP  07/27/2027  . INFLUENZA VACCINE  Discontinued  . PNA vac Low Risk Adult  Discontinued   Immunization History  Administered Date(s) Administered  . DTaP 08/31/2005  . PPD Test 03/17/2014  . Td 07/26/2017   Last Colon - 12/26/2017 - Dr Michael Waller- recc no f/u due to age.  Past Surgical History:  Procedure Laterality Date  . COLONOSCOPY  2008  . CYSTOSCOPY WITH RETROGRADE PYELOGRAM, URETEROSCOPY AND STENT PLACEMENT Bilateral 04/19/2016   Procedure: CYSTOSCOPY WITH BILATERAL RETROGRADE URETEROSCOPY BASKET EXTRACTION;  Surgeon: Michael Seal, MD;  Location: [facility];  Service: Urology;  Laterality: Bilateral;  . EXTRACORPOREAL SHOCK WAVE LITHOTRIPSY  [phone]  . HAND LIGAMENT RECONSTRUCTION Right 2009  . PROSTATE BIOPSY  2000, 12/2004   negative   Family History  Problem Relation Age of Onset  . Heart disease Mother   . Diabetes Mother   . Diabetes Father   . Heart disease Father   . Heart attack Father   . Diabetes Sister   . Colon cancer Sister   . Diabetes Brother   . Pancreatic cancer Brother   . Diabetes Brother   . Diabetes Brother   . Heart disease Brother   . Stroke Brother   . Cancer Sister        breast  . Breast cancer Sister   . Cancer Sister        colon  . Stomach cancer Neg Hx    Social History   Socioeconomic History  . Marital status: Married    Spouse name: Michael Waller  . Number of children: None  Occupational History  . Not on file  Tobacco Use  . Smoking status: Former Smoker    Years: 5.00    Types: Cigarettes    Last attempt to quit: 05/23/1965    Years since quitting: 53.2  . Smokeless tobacco: Former Systems developer    Types: Gering date: 04/11/1966  Substance and Sexual Activity  . Alcohol use: No  . Drug use: No  . 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   1 caffeinated beverage daily.   Former user of smokeless tobacco and former smoker no current smoking drug use or alcohol    ROS Constitutional: Denies fever, chills, weight loss/gain, headaches, insomnia,  night sweats or change in appetite. Does c/o  fatigue. Eyes: Denies redness, blurred vision, diplopia, discharge, itchy or watery eyes.  ENT: Denies discharge, congestion, post nasal drip, epistaxis, sore throat, earache, hearing loss, dental pain, Tinnitus, Vertigo, Sinus pain or snoring.  Cardio: Denies chest pain, palpitations, irregular heartbeat, syncope, dyspnea, diaphoresis, orthopnea, PND, claudication or edema Respiratory: denies cough, dyspnea, DOE, pleurisy, hoarseness, laryngitis or wheezing.  Gastrointestinal: Denies dysphagia, heartburn, reflux, water brash, pain, cramps, nausea, vomiting, bloating, diarrhea, constipation, hematemesis, melena, hematochezia, jaundice or hemorrhoids Genitourinary: Denies dysuria, frequency, urgency, nocturia, hesitancy, discharge, hematuria or flank pain Musculoskeletal: Denies arthralgia, myalgia, stiffness, Jt. Swelling, pain, limp or strain/sprain. Denies Falls. Skin: Denies puritis, rash, hives, warts, acne, eczema or change in skin lesion Neuro: No weakness, tremor, incoordination, spasms, paresthesia or pain Psychiatric: Denies confusion, memory loss or sensory loss. Denies Depression. Endocrine: Denies change in weight, skin, hair change, nocturia, and paresthesia, diabetic polys, visual blurring or hyper / hypo glycemic episodes.  Heme/Lymph: No excessive bleeding, bruising or enlarged lymph nodes.  Physical Exam  BP 140/62   Pulse (!) 56   Temp (!) 97 F (36.1 C)   Resp 16   Ht 6' 3.25" (1.911 m)   Wt 210 lb 9.6 oz (95.5 kg)   BMI 26.15 kg/m   General Appearance: Well nourished  and well groomed and in no apparent distress.  Eyes: PERRLA, EOMs, conjunctiva no swelling or erythema, normal fundi and vessels. Sinuses: No frontal/maxillary tenderness ENT/Mouth: EACs patent / TMs  nl. Nares clear without erythema, swelling, mucoid exudates. Oral hygiene is good. No erythema, swelling, or exudate. Tongue normal, non-obstructing. Tonsils not swollen or erythematous. Hearing normal.  Neck: Supple, thyroid not palpable. No bruits, nodes or JVD. Respiratory: Respiratory effort normal.  BS equal and clear bilateral without rales, rhonci, wheezing or stridor. Cardio: Heart sounds are normal with regular rate and rhythm and no murmurs, rubs or gallops. Peripheral pulses are normal and equal bilaterally without edema. No aortic or femoral bruits. Chest: symmetric with normal excursions and percussion.  Abdomen: Soft, with Nl bowel sounds. Nontender, no guarding, rebound, hernias, masses, or organomegaly.  Lymphatics: Non tender without lymphadenopathy.  Musculoskeletal: Full ROM all peripheral extremities, joint stability, 5/5 strength, and normal gait. Skin: Warm and dry without rashes, lesions, cyanosis, clubbing or  ecchymosis.  Neuro: Cranial nerves intact, reflexes equal bilaterally. Normal muscle tone, no cerebellar symptoms. Sensation intact.  Pysch: Alert and oriented X 3 with normal affect, insight and judgment appropriate.   Assessment and Plan  1. Annual Preventative/Screening Exam   2. Essential hypertension  - EKG 12-Lead - [place], RETROPERITNL ABD,  LTD - Urinalysis, Routine w reflex microscopic - Microalbumin / creatinine urine ratio - CBC with Differential/Platelet - COMPLETE METABOLIC PANEL WITH GFR - Magnesium - TSH  3. Hyperlipidemia, mixed  - EKG 12-Lead - [place], RETROPERITNL ABD,  LTD - Lipid panel - TSH  4. Abnormal glucose  - EKG 12-Lead - [place], RETROPERITNL ABD,  LTD - Hemoglobin A1c - Insulin, random  5. Vitamin D deficiency  - VITAMIN D 25  Hydroxyl  6. Prediabetes  - EKG 12-Lead - [place], RETROPERITNL ABD,  LTD  7. Idiopathic gout  - Uric acid  8. Screening for colorectal cancer  - POC Hemoccult Bld/Stl  9. BPH with obstruction/lower urinary tract symptoms  - Urinalysis, Routine w reflex microscopic - PSA  10. Screening for prostate cancer  - PSA  11. Screening for ischemic heart disease  - EKG 12-Lead  12. FHx: heart disease  - EKG 12-Lead -  [place], RETROPERITNL ABD,  LTD  13. Former smoker  - EKG 12-Lead - [place], RETROPERITNL ABD,  LTD  14. Screening for AAA (aortic abdominal aneurysm)  - [place], RETROPERITNL ABD,  LTD  15. Medication management  - Urinalysis, Routine w reflex microscopic - Microalbumin / creatinine urine ratio - CBC with Differential/Platelet - COMPLETE METABOLIC PANEL WITH GFR - Magnesium - Lipid panel - TSH - Hemoglobin A1c - Insulin, random - VITAMIN D 25 Hydroxyl       Patient was counseled in prudent diet, weight control to achieve/maintain BMI less than 25, BP monitoring, regular exercise and medications as discussed.  Discussed med effects and SE's. Routine screening labs and tests as requested with regular follow-up as recommended. Over 40 minutes of exam, counseling, chart review and high complex critical decision making was performed

## 2018-08-14 NOTE — Patient Instructions (Signed)

## 2018-08-15 ENCOUNTER — Ambulatory Visit (INDEPENDENT_AMBULATORY_CARE_PROVIDER_SITE_OTHER): Payer: PPO | Admitting: Internal Medicine

## 2018-08-15 ENCOUNTER — Other Ambulatory Visit: Payer: Self-pay

## 2018-08-15 VITALS — BP 140/62 | HR 56 | Temp 97.0°F | Resp 16 | Ht 75.25 in | Wt 210.6 lb

## 2018-08-15 DIAGNOSIS — Z0000 Encounter for general adult medical examination without abnormal findings: Secondary | ICD-10-CM | POA: Diagnosis not present

## 2018-08-15 DIAGNOSIS — Z136 Encounter for screening for cardiovascular disorders: Secondary | ICD-10-CM | POA: Diagnosis not present

## 2018-08-15 DIAGNOSIS — N138 Other obstructive and reflux uropathy: Secondary | ICD-10-CM

## 2018-08-15 DIAGNOSIS — N401 Enlarged prostate with lower urinary tract symptoms: Secondary | ICD-10-CM

## 2018-08-15 DIAGNOSIS — I10 Essential (primary) hypertension: Secondary | ICD-10-CM | POA: Diagnosis not present

## 2018-08-15 DIAGNOSIS — E782 Mixed hyperlipidemia: Secondary | ICD-10-CM

## 2018-08-15 DIAGNOSIS — R7309 Other abnormal glucose: Secondary | ICD-10-CM | POA: Diagnosis not present

## 2018-08-15 DIAGNOSIS — Z87891 Personal history of nicotine dependence: Secondary | ICD-10-CM

## 2018-08-15 DIAGNOSIS — Z125 Encounter for screening for malignant neoplasm of prostate: Secondary | ICD-10-CM

## 2018-08-15 DIAGNOSIS — Z79899 Other long term (current) drug therapy: Secondary | ICD-10-CM

## 2018-08-15 DIAGNOSIS — E559 Vitamin D deficiency, unspecified: Secondary | ICD-10-CM

## 2018-08-15 DIAGNOSIS — Z1212 Encounter for screening for malignant neoplasm of rectum: Secondary | ICD-10-CM

## 2018-08-15 DIAGNOSIS — M1000 Idiopathic gout, unspecified site: Secondary | ICD-10-CM

## 2018-08-15 DIAGNOSIS — R7303 Prediabetes: Secondary | ICD-10-CM

## 2018-08-15 DIAGNOSIS — Z1211 Encounter for screening for malignant neoplasm of colon: Secondary | ICD-10-CM

## 2018-08-15 DIAGNOSIS — Z8249 Family history of ischemic heart disease and other diseases of the circulatory system: Secondary | ICD-10-CM

## 2018-08-15 DIAGNOSIS — Z0001 Encounter for general adult medical examination with abnormal findings: Secondary | ICD-10-CM

## 2018-08-16 LAB — CBC WITH DIFFERENTIAL/PLATELET
Absolute Monocytes: 518 cells/uL (ref 200–950)
Basophils Absolute: 50 cells/uL (ref 0–200)
Basophils Relative: 0.7 %
Eosinophils Absolute: 114 cells/uL (ref 15–500)
Eosinophils Relative: 1.6 %
HCT: 41.9 % (ref 38.5–50.0)
Hemoglobin: 14.5 g/dL (ref 13.2–17.1)
Lymphs Abs: 1654 cells/uL (ref 850–3900)
MCH: 32.1 pg (ref 27.0–33.0)
MCHC: 34.6 g/dL (ref 32.0–36.0)
MCV: 92.7 fL (ref 80.0–100.0)
MPV: 10.4 fL (ref 7.5–12.5)
Monocytes Relative: 7.3 %
NEUTROS ABS: 4764 {cells}/uL (ref 1500–7800)
Neutrophils Relative %: 67.1 %
Platelets: 210 10*3/uL (ref 140–400)
RBC: 4.52 10*6/uL (ref 4.20–5.80)
RDW: 12.6 % (ref 11.0–15.0)
Total Lymphocyte: 23.3 %
WBC: 7.1 10*3/uL (ref 3.8–10.8)

## 2018-08-16 LAB — TSH: TSH: 1.51 mIU/L (ref 0.40–4.50)

## 2018-08-16 LAB — HEMOGLOBIN A1C
Hgb A1c MFr Bld: 5.9 % of total Hgb — ABNORMAL HIGH (ref <5.7)
Mean Plasma Glucose: 123 (calc)
eAG (mmol/L): 6.8 (calc)

## 2018-08-16 LAB — LIPID PANEL
Cholesterol: 157 mg/dL (ref <200)
HDL: 53 mg/dL (ref >=40)
LDL Cholesterol (Calc): 86 mg/dL (calc)
Non-HDL Cholesterol (Calc): 104 mg/dL (calc) (ref <130)
TRIGLYCERIDES: 90 mg/dL (ref <150)
Total CHOL/HDL Ratio: 3 (calc) (ref <5.0)

## 2018-08-16 LAB — URINALYSIS, ROUTINE W REFLEX MICROSCOPIC
Bilirubin Urine: NEGATIVE
Glucose, UA: NEGATIVE
Hgb urine dipstick: NEGATIVE
Ketones, ur: NEGATIVE
Leukocytes,Ua: NEGATIVE
Nitrite: NEGATIVE
Protein, ur: NEGATIVE
Specific Gravity, Urine: 1.01 (ref 1.001–1.03)
pH: 6.5 (ref 5.0–8.0)

## 2018-08-16 LAB — COMPLETE METABOLIC PANEL WITH GFR
AG Ratio: 2 (calc) (ref 1.0–2.5)
ALT: 32 U/L (ref 9–46)
AST: 34 U/L (ref 10–35)
Albumin: 4.7 g/dL (ref 3.6–5.1)
Alkaline phosphatase (APISO): 73 U/L (ref 35–144)
BUN: 17 mg/dL (ref 7–25)
CO2: 28 mmol/L (ref 20–32)
Calcium: 9.8 mg/dL (ref 8.6–10.3)
Chloride: 101 mmol/L (ref 98–110)
Creat: 0.85 mg/dL (ref 0.70–1.18)
GFR, Est African American: 97 mL/min/{1.73_m2} (ref >=60)
GFR, Est Non African American: 84 mL/min/{1.73_m2} (ref >=60)
GLUCOSE: 110 mg/dL — AB (ref 65–99)
Globulin: 2.4 g/dL (calc) (ref 1.9–3.7)
Potassium: 4.4 mmol/L (ref 3.5–5.3)
Sodium: 139 mmol/L (ref 135–146)
Total Bilirubin: 0.7 mg/dL (ref 0.2–1.2)
Total Protein: 7.1 g/dL (ref 6.1–8.1)

## 2018-08-16 LAB — MICROALBUMIN / CREATININE URINE RATIO
Creatinine, Urine: 43 mg/dL (ref 20–320)
Microalb Creat Ratio: 7 mcg/mg creat (ref <30)
Microalb, Ur: 0.3 mg/dL

## 2018-08-16 LAB — MAGNESIUM: Magnesium: 1.9 mg/dL (ref 1.5–2.5)

## 2018-08-16 LAB — VITAMIN D 25 HYDROXY (VIT D DEFICIENCY, FRACTURES): Vit D, 25-Hydroxy: 83 ng/mL (ref 30–100)

## 2018-08-16 LAB — URIC ACID: Uric Acid, Serum: 5.6 mg/dL (ref 4.0–8.0)

## 2018-08-16 LAB — INSULIN, RANDOM: Insulin: 7.3 u[IU]/mL

## 2018-08-16 LAB — PSA: PSA: 1.3 ng/mL (ref <=4.0)

## 2018-08-27 ENCOUNTER — Other Ambulatory Visit: Payer: Self-pay | Admitting: Internal Medicine

## 2018-08-27 DIAGNOSIS — I10 Essential (primary) hypertension: Secondary | ICD-10-CM

## 2018-09-03 ENCOUNTER — Other Ambulatory Visit: Payer: Self-pay | Admitting: Internal Medicine

## 2018-09-20 ENCOUNTER — Other Ambulatory Visit: Payer: Self-pay | Admitting: Internal Medicine

## 2018-10-02 ENCOUNTER — Other Ambulatory Visit: Payer: Self-pay | Admitting: Physician Assistant

## 2018-10-04 ENCOUNTER — Other Ambulatory Visit: Payer: Self-pay | Admitting: *Deleted

## 2018-10-04 MED ORDER — ATORVASTATIN CALCIUM 20 MG PO TABS: 20.0000 mg | ORAL_TABLET | Freq: Every day | ORAL | 5 refills | Status: DC

## 2018-11-06 ENCOUNTER — Other Ambulatory Visit: Payer: Self-pay | Admitting: Adult Health

## 2018-11-06 DIAGNOSIS — I10 Essential (primary) hypertension: Secondary | ICD-10-CM

## 2018-11-19 NOTE — Progress Notes (Signed)
FOLLOW UP  Assessment and Plan:   Hypertension Well controlled with current medications  Monitor blood pressure at home; patient to call if consistently greater than 130/80 Continue DASH diet.   Reminder to go to the ER if any CP, SOB, nausea, dizziness, severe HA, changes vision/speech, left arm numbness and tingling and jaw pain.  Cholesterol Currently at goal; continue statin Continue low cholesterol diet and exercise.  Check lipid panel.   Prediabetes Continue diet and exercise.  Perform daily foot/skin check, notify office of any concerning changes.  Check A1C at CPE; monitor CMP and weight  BMI 25 Continue to recommend diet heavy in fruits and veggies and low in animal meats, cheeses, and dairy products, appropriate calorie intake Discuss exercise recommendations routinely Continue to monitor weight at each visit  Vitamin D Def At goal at last visit; continue supplementation to maintain goal of 60-100 Defer Vit D level  Gout Continue allopurinol Diet discussed Check uric acid as needed  GERD Well managed on current medications; monitor magnesium, etc for PPI Discussed diet, avoiding triggers and other lifestyle changes   Continue diet and meds as discussed. Further disposition pending results of labs. Discussed med's effects and SE's.   Over 30 minutes of exam, counseling, chart review, and critical decision making was performed.   Future Appointments  Date Time Provider Christopher  02/21/2019  9:30 AM Unk Pinto, MD [facility] None  05/21/2019 10:00 AM Garnet Sierras, NP [facility] None  08/28/2019  9:00 AM Unk Pinto, MD [facility] None    ----------------------------------------------------------------------------------------------------------------------  HPI 78 y.o. male  presents for 3 month follow up on hypertension, cholesterol, prediabetes, weight and vitamin D deficiency. He also has hyperuricemia, GERD, BPH with LUTS controlled  on respective medications. He is followed by urology.   he has a diagnosis of GERD which is currently managed by omeprazole 20 mg daily (in evening) He had normal EGD in 2019 by Dr. Carlean Purl he reports symptoms is currently well controlled, and denies breakthrough reflux, burning in chest, hoarseness or cough except at night if he lays flat - he has adjustable bed and raises the head  BMI is Body mass index is 25.2 kg/m., he has been working on diet and exercise - he is very active in his yard, mowing, gardening, etc.  Wt Readings from Last 3 Encounters:  11/21/18 203 lb (92.1 kg)  08/15/18 210 lb 9.6 oz (95.5 kg)  05/14/18 206 lb 9.6 oz (93.7 kg)   His blood pressure has been controlled at home (105-120s/60s), today their BP is BP: 136/82  He does workout. He denies chest pain, shortness of breath, dizziness.   He is on cholesterol medication (atorvastatin 20 mg daily) and denies myalgias. His cholesterol is at goal. The cholesterol last visit was:   Lab Results  Component Value Date   CHOL 157 08/15/2018   HDL 53 08/15/2018   LDLCALC 86 08/15/2018   TRIG 90 08/15/2018   CHOLHDL 3.0 08/15/2018    He has been working on diet and exercise for prediabetes, and denies foot ulcerations, increased appetite, nausea, paresthesia of the feet, polydipsia, polyuria, visual disturbances, vomiting and weight loss. Last A1C in the office was:  Lab Results  Component Value Date   HGBA1C 5.9 (H) 08/15/2018   Patient is on Vitamin D supplement.   Lab Results  Component Value Date   VD25OH 46 08/15/2018     Patient is on allopurinol for hyperurecemia and does not report a recent flare.  Lab Results  Component Value Date   LABURIC 5.6 08/15/2018      Current Medications:  Current Outpatient Medications on File Prior to Visit  Medication Sig  . Alfalfa 650 MG TABS Take 2 tablets by mouth daily.  Marland Kitchen allopurinol (ZYLOPRIM) 300 MG tablet TAKE 1 TABLET BY MOUTH DAILY  . aspirin 81 MG tablet  Take 81 mg by mouth daily.  Marland Kitchen atorvastatin (LIPITOR) 20 MG tablet Take 1 tablet (20 mg total) by mouth daily. (Patient taking differently: Take 20 mg by mouth daily. Takes 1/2 tablet daily)  . bisoprolol-hydrochlorothiazide (ZIAC) 10-6.25 MG tablet Take 1 tablet Daily for BP (Patient taking differently: Take 1/2 tablet Daily for BP)  . blood glucose meter kit and supplies Dispense based insurance preference. E11.9  . Cholecalciferol (VITAMIN D3) 2000 units TABS Take 3 tablets by mouth daily.  . Cinnamon 500 MG TABS Take 2 tablets by mouth daily.  Marland Kitchen doxazosin (CARDURA) 8 MG tablet TAKE 1 TABLET BY MOUTH DAILY (Patient taking differently: Take 1/2 tablet daily)  . enalapril (VASOTEC) 20 MG tablet TAKE 1 TABLET BY MOUTH EVERY MORNING ANDEVENING (Patient taking differently: TAKE 1 TABLET BY MOUTH EVERY MORNING AND EVENING)  . finasteride (PROSCAR) 5 MG tablet Take 5 mg by mouth every evening.   . Garlic 0947 MG CAPS Take 1,000 mg by mouth daily.  Marland Kitchen glucose blood (ONE TOUCH ULTRA TEST) test strip USE TO CHECK BLOOD SUGAR ONCE DAILY  . glucose blood test strip Check blood sugar once daily  . loratadine (CLARITIN) 10 MG tablet Take 10 mg by mouth daily as needed.  . Magnesium 250 MG TABS Take 250 mg by mouth 2 (two) times daily. Breakfast and lunch  . minoxidil (LONITEN) 2.5 MG tablet Take 1 to 2 tablets ever morning for BP  . Multiple Vitamin (MULTIVITAMIN) tablet Take 1 tablet by mouth daily.  . Omega-3 Fatty Acids (OMEGA-3 FISH OIL) 1200 MG CAPS Take 1 capsule by mouth daily with lunch.  Marland Kitchen omeprazole (PRILOSEC) 20 MG capsule Take 1 capsule (20 mg total) by mouth daily.  Glory Rosebush Delica Lancets 09G MISC Check blood sugar 1 time a day.   No current facility-administered medications on file prior to visit.      Allergies:  Allergies  Allergen Reactions  . Adhesive [Tape] Other (See Comments)    reddness     Medical History:  Past Medical History:  Diagnosis Date  . Benign localized  prostatic hyperplasia with lower urinary tract symptoms (LUTS)   . Bruises easily   . Elevated PSA   . GERD (gastroesophageal reflux disease)   . History of gout   . History of kidney stones   . Hyperlipidemia   . Hypertension   . Nephrolithiasis   . Nocturia   . Pre-diabetes   . Vitamin D deficiency   . Wears dentures    full upper and lower partial    Family history- Reviewed and unchanged Social history- Reviewed and unchanged   Review of Systems:  Review of Systems  Constitutional: Negative for malaise/fatigue and weight loss.  HENT: Negative for hearing loss and tinnitus.   Eyes: Negative for blurred vision and double vision.  Respiratory: Negative for cough, shortness of breath and wheezing.   Cardiovascular: Negative for chest pain, palpitations, orthopnea, claudication and leg swelling.  Gastrointestinal: Positive for heartburn (intermittently at night; improved with raising head of bed). Negative for abdominal pain, blood in stool, constipation, diarrhea, melena, nausea and vomiting.  Genitourinary: Negative.   Musculoskeletal: Negative  for joint pain and myalgias.  Skin: Negative for rash.  Neurological: Negative for dizziness, tingling, sensory change, weakness and headaches.  Endo/Heme/Allergies: Negative for polydipsia.  Psychiatric/Behavioral: Negative.   All other systems reviewed and are negative.   Physical Exam:  BP 136/82   Pulse (!) 57   Temp 97.7 F (36.5 C)   Ht 6' 3.25" (1.911 m)   Wt 203 lb (92.1 kg)   SpO2 96%   BMI 25.20 kg/m  Wt Readings from Last 3 Encounters:  11/21/18 203 lb (92.1 kg)  08/15/18 210 lb 9.6 oz (95.5 kg)  05/14/18 206 lb 9.6 oz (93.7 kg)   General Appearance: Well nourished, in no apparent distress. Eyes: PERRLA, EOMs, conjunctiva no swelling or erythema Sinuses: No Frontal/maxillary tenderness ENT/Mouth: Ext aud canals clear, TMs without erythema, bulging. No erythema, swelling, or exudate on post pharynx.  Tonsils not  swollen or erythematous. Hearing normal.  Neck: Supple, thyroid normal.  Respiratory: Respiratory effort normal, BS equal bilaterally without rales, rhonchi, wheezing or stridor.  Cardio: RRR with no MRGs. Brisk peripheral pulses without edema.  Abdomen: Soft, + BS.  Non tender, no guarding, rebound, hernias, masses. Lymphatics: Non tender without lymphadenopathy.  Musculoskeletal: Full ROM, 5/5 strength, Normal gait Skin: Warm, dry without rashes, lesions, ecchymosis.  Neuro: Cranial nerves intact. No cerebellar symptoms.  Psych: Awake and oriented X 3, normal affect, Insight and Judgment appropriate.    Izora Ribas, NP 9:13 AM [facility]

## 2018-11-21 ENCOUNTER — Ambulatory Visit (INDEPENDENT_AMBULATORY_CARE_PROVIDER_SITE_OTHER): Payer: PPO | Admitting: Adult Health

## 2018-11-21 ENCOUNTER — Encounter: Payer: Self-pay | Admitting: Adult Health

## 2018-11-21 ENCOUNTER — Other Ambulatory Visit: Payer: Self-pay

## 2018-11-21 VITALS — BP 136/82 | HR 57 | Temp 97.7°F | Ht 75.25 in | Wt 203.0 lb

## 2018-11-21 DIAGNOSIS — E559 Vitamin D deficiency, unspecified: Secondary | ICD-10-CM | POA: Diagnosis not present

## 2018-11-21 DIAGNOSIS — Z6825 Body mass index (BMI) 25.0-25.9, adult: Secondary | ICD-10-CM | POA: Diagnosis not present

## 2018-11-21 DIAGNOSIS — E782 Mixed hyperlipidemia: Secondary | ICD-10-CM | POA: Diagnosis not present

## 2018-11-21 DIAGNOSIS — R7303 Prediabetes: Secondary | ICD-10-CM | POA: Diagnosis not present

## 2018-11-21 DIAGNOSIS — I10 Essential (primary) hypertension: Secondary | ICD-10-CM | POA: Diagnosis not present

## 2018-11-21 DIAGNOSIS — M109 Gout, unspecified: Secondary | ICD-10-CM | POA: Diagnosis not present

## 2018-11-21 DIAGNOSIS — K219 Gastro-esophageal reflux disease without esophagitis: Secondary | ICD-10-CM | POA: Diagnosis not present

## 2018-11-21 NOTE — Patient Instructions (Addendum)
Goals    . Blood Pressure < 130/80    . Weight (lb) < 200 lb (90.7 kg)       Gastroesophageal Reflux Disease, Adult Gastroesophageal reflux (GER) happens when acid from the stomach flows up into the tube that connects the mouth and the stomach (esophagus). Normally, food travels down the esophagus and stays in the stomach to be digested. With GER, food and stomach acid sometimes move back up into the esophagus. You may have a disease called gastroesophageal reflux disease (GERD) if the reflux:  Happens often.  Causes frequent or very bad symptoms.  Causes problems such as damage to the esophagus. When this happens, the esophagus becomes sore and swollen (inflamed). Over time, GERD can make small holes (ulcers) in the lining of the esophagus. What are the causes? This condition is caused by a problem with the muscle between the esophagus and the stomach. When this muscle is weak or not normal, it does not close properly to keep food and acid from coming back up from the stomach. The muscle can be weak because of:  Tobacco use.  Pregnancy.  Having a certain type of hernia (hiatal hernia).  Alcohol use.  Certain foods and drinks, such as coffee, chocolate, onions, and peppermint. What increases the risk? You are more likely to develop this condition if you:  Are overweight.  Have a disease that affects your connective tissue.  Use NSAID medicines. What are the signs or symptoms? Symptoms of this condition include:  Heartburn.  Difficult or painful swallowing.  The feeling of having a lump in the throat.  A bitter taste in the mouth.  Bad breath.  Having a lot of saliva.  Having an upset or bloated stomach.  Belching.  Chest pain. Different conditions can cause chest pain. Make sure you see your doctor if you have chest pain.  Shortness of breath or noisy breathing (wheezing).  Ongoing (chronic) cough or a cough at night.  Wearing away of the surface of teeth  (tooth enamel).  Weight loss. How is this treated? Treatment will depend on how bad your symptoms are. Your doctor may suggest:  Changes to your diet.  Medicine.  Surgery. Follow these instructions at home: Eating and drinking   Follow a diet as told by your doctor. You may need to avoid foods and drinks such as: ? Coffee and tea (with or without caffeine). ? Drinks that contain alcohol. ? Energy drinks and sports drinks. ? Bubbly (carbonated) drinks or sodas. ? Chocolate and cocoa. ? Peppermint and mint flavorings. ? Garlic and onions. ? Horseradish. ? Spicy and acidic foods. These include peppers, chili powder, curry powder, vinegar, hot sauces, and BBQ sauce. ? Citrus fruit juices and citrus fruits, such as oranges, lemons, and limes. ? Tomato-based foods. These include red sauce, chili, salsa, and pizza with red sauce. ? Fried and fatty foods. These include donuts, french fries, potato chips, and high-fat dressings. ? High-fat meats. These include hot dogs, rib eye steak, sausage, ham, and bacon. ? High-fat dairy items, such as whole milk, butter, and cream cheese.  Eat small meals often. Avoid eating large meals.  Avoid drinking large amounts of liquid with your meals.  Avoid eating meals during the 2-3 hours before bedtime.  Avoid lying down right after you eat.  Do not exercise right after you eat. Lifestyle   Do not use any products that contain nicotine or tobacco. These include cigarettes, e-cigarettes, and chewing tobacco. If you need help quitting,  ask your doctor.  Try to lower your stress. If you need help doing this, ask your doctor.  If you are overweight, lose an amount of weight that is healthy for you. Ask your doctor about a safe weight loss goal. General instructions  Pay attention to any changes in your symptoms.  Take over-the-counter and prescription medicines only as told by your doctor. Do not take aspirin, ibuprofen, or other NSAIDs  unless your doctor says it is okay.  Wear loose clothes. Do not wear anything tight around your waist.  Raise (elevate) the head of your bed about 6 inches (15 cm).  Avoid bending over if this makes your symptoms worse.  Keep all follow-up visits as told by your doctor. This is important. Contact a doctor if:  You have new symptoms.  You lose weight and you do not know why.  You have trouble swallowing or it hurts to swallow.  You have wheezing or a cough that keeps happening.  Your symptoms do not get better with treatment.  You have a hoarse voice. Get help right away if:  You have pain in your arms, neck, jaw, teeth, or back.  You feel sweaty, dizzy, or light-headed.  You have chest pain or shortness of breath.  You throw up (vomit) and your throw-up looks like blood or coffee grounds.  You pass out (faint).  Your poop (stool) is bloody or black.  You cannot swallow, drink, or eat. Summary  If a person has gastroesophageal reflux disease (GERD), food and stomach acid move back up into the esophagus and cause symptoms or problems such as damage to the esophagus.  Treatment will depend on how bad your symptoms are.  Follow a diet as told by your doctor.  Take all medicines only as told by your doctor. This information is not intended to replace advice given to you by your health care provider. Make sure you discuss any questions you have with your health care provider. Document Released: 10/26/2007 Document Revised: 11/15/2017 Document Reviewed: 11/15/2017 Elsevier Patient Education  2020 [employer].

## 2018-11-22 LAB — COMPLETE METABOLIC PANEL WITH GFR
AG Ratio: 2.1 (calc) (ref 1.0–2.5)
ALT: 18 U/L (ref 9–46)
AST: 20 U/L (ref 10–35)
Albumin: 4.5 g/dL (ref 3.6–5.1)
Alkaline phosphatase (APISO): 69 U/L (ref 35–144)
BUN: 18 mg/dL (ref 7–25)
CO2: 28 mmol/L (ref 20–32)
Calcium: 9.7 mg/dL (ref 8.6–10.3)
Chloride: 104 mmol/L (ref 98–110)
Creat: 0.76 mg/dL (ref 0.70–1.18)
GFR, Est African American: 101 mL/min/{1.73_m2} (ref >=60)
GFR, Est Non African American: 87 mL/min/{1.73_m2} (ref >=60)
Globulin: 2.1 g/dL (calc) (ref 1.9–3.7)
Glucose, Bld: 123 mg/dL — ABNORMAL HIGH (ref 65–99)
Potassium: 4.2 mmol/L (ref 3.5–5.3)
Sodium: 141 mmol/L (ref 135–146)
Total Bilirubin: 0.8 mg/dL (ref 0.2–1.2)
Total Protein: 6.6 g/dL (ref 6.1–8.1)

## 2018-11-22 LAB — CBC WITH DIFFERENTIAL/PLATELET
Absolute Monocytes: 450 cells/uL (ref 200–950)
Basophils Absolute: 23 cells/uL (ref 0–200)
Basophils Relative: 0.4 %
Eosinophils Absolute: 68 cells/uL (ref 15–500)
Eosinophils Relative: 1.2 %
HCT: 39.8 % (ref 38.5–50.0)
Hemoglobin: 13.5 g/dL (ref 13.2–17.1)
Lymphs Abs: 1385 cells/uL (ref 850–3900)
MCH: 31.8 pg (ref 27.0–33.0)
MCHC: 33.9 g/dL (ref 32.0–36.0)
MCV: 93.9 fL (ref 80.0–100.0)
MPV: 10.5 fL (ref 7.5–12.5)
Monocytes Relative: 7.9 %
Neutro Abs: 3773 cells/uL (ref 1500–7800)
Neutrophils Relative %: 66.2 %
Platelets: 199 10*3/uL (ref 140–400)
RBC: 4.24 10*6/uL (ref 4.20–5.80)
RDW: 12.8 % (ref 11.0–15.0)
Total Lymphocyte: 24.3 %
WBC: 5.7 10*3/uL (ref 3.8–10.8)

## 2018-11-22 LAB — LIPID PANEL
Cholesterol: 144 mg/dL (ref <200)
HDL: 49 mg/dL (ref >=40)
LDL Cholesterol (Calc): 80 mg/dL (calc)
Non-HDL Cholesterol (Calc): 95 mg/dL (calc) (ref <130)
Total CHOL/HDL Ratio: 2.9 (calc) (ref <5.0)
Triglycerides: 69 mg/dL (ref <150)

## 2018-11-22 LAB — MAGNESIUM: Magnesium: 1.9 mg/dL (ref 1.5–2.5)

## 2018-11-22 LAB — TSH: TSH: 1.08 mIU/L (ref 0.40–4.50)

## 2018-11-23 ENCOUNTER — Other Ambulatory Visit: Payer: Self-pay | Admitting: Internal Medicine

## 2019-01-15 DIAGNOSIS — H524 Presbyopia: Secondary | ICD-10-CM | POA: Diagnosis not present

## 2019-01-15 DIAGNOSIS — H52223 Regular astigmatism, bilateral: Secondary | ICD-10-CM | POA: Diagnosis not present

## 2019-01-15 DIAGNOSIS — H5213 Myopia, bilateral: Secondary | ICD-10-CM | POA: Diagnosis not present

## 2019-01-15 DIAGNOSIS — H40013 Open angle with borderline findings, low risk, bilateral: Secondary | ICD-10-CM | POA: Diagnosis not present

## 2019-01-15 DIAGNOSIS — H40053 Ocular hypertension, bilateral: Secondary | ICD-10-CM | POA: Diagnosis not present

## 2019-01-24 ENCOUNTER — Other Ambulatory Visit: Payer: Self-pay | Admitting: Adult Health

## 2019-01-24 DIAGNOSIS — I10 Essential (primary) hypertension: Secondary | ICD-10-CM

## 2019-02-20 ENCOUNTER — Encounter: Payer: Self-pay | Admitting: Internal Medicine

## 2019-02-20 NOTE — Patient Instructions (Signed)

## 2019-02-20 NOTE — Progress Notes (Signed)
History of Present Illness:      This very nice 78 y.o. [name] presents for 6 month follow up with HTN, HLD, Pre-Diabetes and Vitamin D Deficiency. Patient has Gout & GERD, both controlled on their respective meds. He has BPH/LUTS controlled on Doxazosin/Proscar & is followed by Dr Jeffie Pollock.       Patient is treated for HTN  (1986) & BP has been controlled at home. Today's BP is at goal - 126/62. Patient has had no complaints of any cardiac type chest pain, palpitations, dyspnea / orthopnea / PND, dizziness, claudication, or dependent edema.      Hyperlipidemia is controlled with diet & meds. Patient denies myalgias or other med SE's. Last Lipids were at goal:  Lab Results  Component Value Date   CHOL 144 11/21/2018   HDL 49 11/21/2018   LDLCALC 80 11/21/2018   TRIG 69 11/21/2018   CHOLHDL 2.9 11/21/2018       Also, the patient has history of PreDiabetes (A1c 5.8% / 2006)  and has had no symptoms of reactive hypoglycemia, diabetic polys, paresthesias or visual blurring.  Last A1c was not at goal:  Lab Results  Component Value Date   HGBA1C 5.9 (H) 08/15/2018       Further, the patient also has history of Vitamin D Deficiency("49" on Tx / 2008)   and supplements vitamin D without any suspected side-effects. Last vitamin D was at goal:  Lab Results  Component Value Date   VD25OH 83 08/15/2018   Current Outpatient Medications on File Prior to Visit  Medication Sig  . Alfalfa 650 MG TABS Take 2 tablets by mouth daily.  Marland Kitchen allopurinol (ZYLOPRIM) 300 MG tablet TAKE 1 TABLET BY MOUTH DAILY  . aspirin 81 MG tablet Take 81 mg by mouth daily.  Marland Kitchen atorvastatin (LIPITOR) 20 MG tablet Take 1 tablet (20 mg total) by mouth daily. (Patient taking differently: Take 20 mg by mouth daily. Takes 1/2 tablet daily)  . bisoprolol-hydrochlorothiazide (ZIAC) 10-6.25 MG tablet Take 1 tablet Daily for BP (Patient taking differently: Take 1/2 tablet Daily for BP)  . blood glucose meter kit and supplies  Dispense based insurance preference. E11.9  . Cholecalciferol (VITAMIN D3) 2000 units TABS Take 3 tablets by mouth daily.  . Cinnamon 500 MG TABS Take 2 tablets by mouth daily.  Marland Kitchen doxazosin (CARDURA) 8 MG tablet TAKE 1 TABLET BY MOUTH DAILY (Patient taking differently: Take 1/2 tablet daily)  . enalapril (VASOTEC) 20 MG tablet Take 1 tablet 2 x /day for BP  . finasteride (PROSCAR) 5 MG tablet Take 5 mg by mouth every evening.   . Garlic 7711 MG CAPS Take 1,000 mg by mouth daily.  Marland Kitchen glucose blood (ONE TOUCH ULTRA TEST) test strip USE TO CHECK BLOOD SUGAR ONCE DAILY  . glucose blood (ONETOUCH ULTRA) test strip Check  Blood Sugar Daily (Dx: E11.9)  . glucose blood test strip Check blood sugar once daily  . loratadine (CLARITIN) 10 MG tablet Take 10 mg by mouth daily as needed.  . Magnesium 250 MG TABS Take 250 mg by mouth 2 (two) times daily. Breakfast and lunch  . minoxidil (LONITEN) 2.5 MG tablet Take 1 to 2 tablets ever morning for BP  . Multiple Vitamin (MULTIVITAMIN) tablet Take 1 tablet by mouth daily.  . Omega-3 Fatty Acids (OMEGA-3 FISH OIL) 1200 MG CAPS Take 1 capsule by mouth daily with lunch.  Marland Kitchen omeprazole (PRILOSEC) 20 MG capsule Take 1 capsule (20 mg  total) by mouth daily.  Glory Rosebush Delica Lancets 41L MISC Check blood sugar 1 time a day.   No current facility-administered medications on file prior to visit.    Allergies  Allergen Reactions  . Adhesive [Tape] Other (See Comments)    reddness   PMHx:   Past Medical History:  Diagnosis Date  . Benign localized prostatic hyperplasia with lower urinary tract symptoms (LUTS)   . Bruises easily   . Elevated PSA   . GERD (gastroesophageal reflux disease)   . History of gout   . History of kidney stones   . Hyperlipidemia   . Hypertension   . Nephrolithiasis   . Nocturia   . Pre-diabetes   . Vitamin D deficiency   . Wears dentures    full upper and lower partial    Immunization History  Administered Date(s) Administered   . DTaP 08/31/2005  . PPD Test 03/17/2014  . Td 07/26/2017   Past Surgical History:  Procedure Laterality Date  . COLONOSCOPY  2008  . CYSTOSCOPY WITH RETROGRADE PYELOGRAM, URETEROSCOPY AND STENT PLACEMENT Bilateral 04/19/2016   Procedure: CYSTOSCOPY WITH BILATERAL RETROGRADE URETEROSCOPY BASKET EXTRACTION;  Surgeon: Irine Seal, MD;  Location: [facility];  Service: Urology;  Laterality: Bilateral;  . EXTRACORPOREAL SHOCK WAVE LITHOTRIPSY  [phone]  . HAND LIGAMENT RECONSTRUCTION Right 2009  . PROSTATE BIOPSY  2000, 12/2004   negative   FHx:    Reviewed / unchanged  SHx:    Reviewed / unchanged   Systems Review:  Constitutional: Denies fever, chills, wt changes, headaches, insomnia, fatigue, night sweats, change in appetite. Eyes: Denies redness, blurred vision, diplopia, discharge, itchy, watery eyes.  ENT: Denies discharge, congestion, post nasal drip, epistaxis, sore throat, earache, hearing loss, dental pain, tinnitus, vertigo, sinus pain, snoring.  CV: Denies chest pain, palpitations, irregular heartbeat, syncope, dyspnea, diaphoresis, orthopnea, PND, claudication or edema. Respiratory: denies cough, dyspnea, DOE, pleurisy, hoarseness, laryngitis, wheezing.  Gastrointestinal: Denies dysphagia, odynophagia, heartburn, reflux, water brash, abdominal pain or cramps, nausea, vomiting, bloating, diarrhea, constipation, hematemesis, melena, hematochezia  or hemorrhoids. Genitourinary: Denies dysuria, frequency, urgency, nocturia, hesitancy, discharge, hematuria or flank pain. Musculoskeletal: Denies arthralgias, myalgias, stiffness, jt. swelling, pain, limping or strain/sprain.  Skin: Denies pruritus, rash, hives, warts, acne, eczema or change in skin lesion(s). Neuro: No weakness, tremor, incoordination, spasms, paresthesia or pain. Psychiatric: Denies confusion, memory loss or sensory loss. Endo: Denies change in weight, skin or hair change.  Heme/Lymph: No  excessive bleeding, bruising or enlarged lymph nodes.  Physical Exam  BP 126/62   Pulse (!) 44   Temp (!) 97.2 F (36.2 C)   Resp 16   Ht 6' 3.25" (1.911 m)   Wt 201 lb 6.4 oz (91.4 kg)   BMI 25.01 kg/m   Appears  well nourished, well groomed  and in no distress.  Eyes: PERRLA, EOMs, conjunctiva no swelling or erythema. Sinuses: No frontal/maxillary tenderness ENT/Mouth: EAC's clear, TM's nl w/o erythema, bulging. Nares clear w/o erythema, swelling, exudates. Oropharynx clear without erythema or exudates. Oral hygiene is good. Tongue normal, non obstructing. Hearing intact.  Neck: Supple. Thyroid not palpable. Car 2+/2+ without bruits, nodes or JVD. Chest: Respirations nl with BS clear & equal w/o rales, rhonchi, wheezing or stridor.  Cor: Heart sounds normal w/ regular rate and rhythm without sig. murmurs, gallops, clicks or rubs. Peripheral pulses normal and equal  without edema.  Abdomen: Soft & bowel sounds normal. Non-tender w/o guarding, rebound, hernias, masses or organomegaly.  Lymphatics: Unremarkable.  Musculoskeletal: Full ROM all peripheral extremities, joint stability, 5/5 strength and normal gait.  Skin: Warm, dry without exposed rashes, lesions or ecchymosis apparent.  Neuro: Cranial nerves intact, reflexes equal bilaterally. Sensory-motor testing grossly intact. Tendon reflexes grossly intact.  Pysch: Alert & oriented x 3.  Insight and judgement nl & appropriate. No ideations.  Assessment and Plan:  1. Essential hypertension  - Continue medication, monitor blood pressure at home.  - Continue DASH diet.  Reminder to go to the ER if any CP,  SOB, nausea, dizziness, severe HA, changes vision/speech.  - CBC with Differential/Platelet - COMPLETE METABOLIC PANEL WITH GFR - Magnesium - TSH  2. Hyperlipidemia, mixed  - Continue diet/meds, exercise,& lifestyle modifications.  - Continue monitor periodic cholesterol/liver & renal functions   - Lipid panel - TSH   3. Abnormal glucose  - Continue diet, exercise  - Lifestyle modifications.  - Monitor appropriate labs.  - Hemoglobin A1c - Insulin, random  4. Vitamin D deficiency  - Continue supplementation.  - VITAMIN D 25 Hydroxyl  5. Idiopathic gout  - Uric acid  6. Medication management  - CBC with Differential/Platelet - COMPLETE METABOLIC PANEL WITH GFR - Magnesium - Lipid panel - TSH - Hemoglobin A1c - Insulin, random - VITAMIN D 25 Hydroxyl - Uric acid       Discussed  regular exercise, BP monitoring, weight control to achieve/maintain BMI less than 25 and discussed med and SE's. Recommended labs to assess and monitor clinical status with further disposition pending results of labs.  I discussed the assessment and treatment plan with the patient. The patient was provided an opportunity to ask questions and all were answered. The patient agreed with the plan and demonstrated an understanding of the instructions.  I provided over 30 minutes of exam, counseling, chart review and  complex critical decision making.  Kirtland Bouchard, MD

## 2019-02-21 ENCOUNTER — Ambulatory Visit (INDEPENDENT_AMBULATORY_CARE_PROVIDER_SITE_OTHER): Payer: PPO | Admitting: Internal Medicine

## 2019-02-21 ENCOUNTER — Other Ambulatory Visit: Payer: Self-pay

## 2019-02-21 VITALS — BP 126/62 | HR 44 | Temp 97.2°F | Resp 16 | Ht 75.25 in | Wt 201.4 lb

## 2019-02-21 DIAGNOSIS — E782 Mixed hyperlipidemia: Secondary | ICD-10-CM | POA: Diagnosis not present

## 2019-02-21 DIAGNOSIS — E559 Vitamin D deficiency, unspecified: Secondary | ICD-10-CM | POA: Diagnosis not present

## 2019-02-21 DIAGNOSIS — Z79899 Other long term (current) drug therapy: Secondary | ICD-10-CM

## 2019-02-21 DIAGNOSIS — M1000 Idiopathic gout, unspecified site: Secondary | ICD-10-CM

## 2019-02-21 DIAGNOSIS — I10 Essential (primary) hypertension: Secondary | ICD-10-CM

## 2019-02-21 DIAGNOSIS — R7309 Other abnormal glucose: Secondary | ICD-10-CM

## 2019-02-22 LAB — CBC WITH DIFFERENTIAL/PLATELET
Absolute Monocytes: 529 cells/uL (ref 200–950)
Basophils Absolute: 32 cells/uL (ref 0–200)
Basophils Relative: 0.5 %
Eosinophils Absolute: 139 cells/uL (ref 15–500)
Eosinophils Relative: 2.2 %
HCT: 41.1 % (ref 38.5–50.0)
Hemoglobin: 13.9 g/dL (ref 13.2–17.1)
Lymphs Abs: 1373 cells/uL (ref 850–3900)
MCH: 31.8 pg (ref 27.0–33.0)
MCHC: 33.8 g/dL (ref 32.0–36.0)
MCV: 94.1 fL (ref 80.0–100.0)
MPV: 10.8 fL (ref 7.5–12.5)
Monocytes Relative: 8.4 %
Neutro Abs: 4227 cells/uL (ref 1500–7800)
Neutrophils Relative %: 67.1 %
Platelets: 194 10*3/uL (ref 140–400)
RBC: 4.37 10*6/uL (ref 4.20–5.80)
RDW: 13.1 % (ref 11.0–15.0)
Total Lymphocyte: 21.8 %
WBC: 6.3 10*3/uL (ref 3.8–10.8)

## 2019-02-22 LAB — COMPLETE METABOLIC PANEL WITH GFR
AG Ratio: 2.2 (calc) (ref 1.0–2.5)
ALT: 19 U/L (ref 9–46)
AST: 20 U/L (ref 10–35)
Albumin: 4.4 g/dL (ref 3.6–5.1)
Alkaline phosphatase (APISO): 64 U/L (ref 35–144)
BUN: 22 mg/dL (ref 7–25)
CO2: 29 mmol/L (ref 20–32)
Calcium: 10.1 mg/dL (ref 8.6–10.3)
Chloride: 101 mmol/L (ref 98–110)
Creat: 0.84 mg/dL (ref 0.70–1.18)
GFR, Est African American: 97 mL/min/{1.73_m2} (ref >=60)
GFR, Est Non African American: 84 mL/min/{1.73_m2} (ref >=60)
Globulin: 2 g/dL (calc) (ref 1.9–3.7)
Glucose, Bld: 99 mg/dL (ref 65–99)
Potassium: 4.3 mmol/L (ref 3.5–5.3)
Sodium: 140 mmol/L (ref 135–146)
Total Bilirubin: 1 mg/dL (ref 0.2–1.2)
Total Protein: 6.4 g/dL (ref 6.1–8.1)

## 2019-02-22 LAB — INSULIN, RANDOM: Insulin: 5.5 u[IU]/mL

## 2019-02-22 LAB — URIC ACID: Uric Acid, Serum: 6.5 mg/dL (ref 4.0–8.0)

## 2019-02-22 LAB — HEMOGLOBIN A1C
Hgb A1c MFr Bld: 5.6 % of total Hgb (ref <5.7)
Mean Plasma Glucose: 114 (calc)
eAG (mmol/L): 6.3 (calc)

## 2019-02-22 LAB — MAGNESIUM: Magnesium: 2.1 mg/dL (ref 1.5–2.5)

## 2019-02-22 LAB — LIPID PANEL
Cholesterol: 148 mg/dL (ref <200)
HDL: 55 mg/dL (ref >=40)
LDL Cholesterol (Calc): 80 mg/dL (calc)
Non-HDL Cholesterol (Calc): 93 mg/dL (calc) (ref <130)
Total CHOL/HDL Ratio: 2.7 (calc) (ref <5.0)
Triglycerides: 57 mg/dL (ref <150)

## 2019-02-22 LAB — TSH: TSH: 1.08 mIU/L (ref 0.40–4.50)

## 2019-02-22 LAB — VITAMIN D 25 HYDROXY (VIT D DEFICIENCY, FRACTURES): Vit D, 25-Hydroxy: 84 ng/mL (ref 30–100)

## 2019-02-25 DIAGNOSIS — M545 Low back pain: Secondary | ICD-10-CM | POA: Diagnosis not present

## 2019-02-25 DIAGNOSIS — R351 Nocturia: Secondary | ICD-10-CM | POA: Diagnosis not present

## 2019-02-25 DIAGNOSIS — N401 Enlarged prostate with lower urinary tract symptoms: Secondary | ICD-10-CM | POA: Diagnosis not present

## 2019-02-25 DIAGNOSIS — N200 Calculus of kidney: Secondary | ICD-10-CM | POA: Diagnosis not present

## 2019-03-07 DIAGNOSIS — Z23 Encounter for immunization: Secondary | ICD-10-CM | POA: Diagnosis not present

## 2019-03-07 DIAGNOSIS — L570 Actinic keratosis: Secondary | ICD-10-CM | POA: Diagnosis not present

## 2019-03-07 DIAGNOSIS — L814 Other melanin hyperpigmentation: Secondary | ICD-10-CM | POA: Diagnosis not present

## 2019-03-07 DIAGNOSIS — D225 Melanocytic nevi of trunk: Secondary | ICD-10-CM | POA: Diagnosis not present

## 2019-03-07 DIAGNOSIS — L821 Other seborrheic keratosis: Secondary | ICD-10-CM | POA: Diagnosis not present

## 2019-03-19 DIAGNOSIS — H401131 Primary open-angle glaucoma, bilateral, mild stage: Secondary | ICD-10-CM | POA: Diagnosis not present

## 2019-05-21 ENCOUNTER — Ambulatory Visit: Payer: Self-pay | Admitting: Adult Health Nurse Practitioner

## 2019-05-23 ENCOUNTER — Ambulatory Visit (INDEPENDENT_AMBULATORY_CARE_PROVIDER_SITE_OTHER): Payer: PPO | Admitting: Physician Assistant

## 2019-05-23 ENCOUNTER — Other Ambulatory Visit: Payer: Self-pay

## 2019-05-23 VITALS — BP 136/66 | HR 54 | Temp 97.1°F | Resp 16 | Ht 75.25 in | Wt 208.6 lb

## 2019-05-23 DIAGNOSIS — H9071 Mixed conductive and sensorineural hearing loss, unilateral, right ear, with unrestricted hearing on the contralateral side: Secondary | ICD-10-CM | POA: Diagnosis not present

## 2019-05-23 DIAGNOSIS — E782 Mixed hyperlipidemia: Secondary | ICD-10-CM

## 2019-05-23 DIAGNOSIS — Z0001 Encounter for general adult medical examination with abnormal findings: Secondary | ICD-10-CM | POA: Diagnosis not present

## 2019-05-23 DIAGNOSIS — Z79899 Other long term (current) drug therapy: Secondary | ICD-10-CM | POA: Diagnosis not present

## 2019-05-23 DIAGNOSIS — I10 Essential (primary) hypertension: Secondary | ICD-10-CM | POA: Diagnosis not present

## 2019-05-23 DIAGNOSIS — N138 Other obstructive and reflux uropathy: Secondary | ICD-10-CM | POA: Diagnosis not present

## 2019-05-23 DIAGNOSIS — R6889 Other general symptoms and signs: Secondary | ICD-10-CM

## 2019-05-23 DIAGNOSIS — J301 Allergic rhinitis due to pollen: Secondary | ICD-10-CM | POA: Diagnosis not present

## 2019-05-23 DIAGNOSIS — Z0000 Encounter for general adult medical examination without abnormal findings: Secondary | ICD-10-CM

## 2019-05-23 DIAGNOSIS — Z6825 Body mass index (BMI) 25.0-25.9, adult: Secondary | ICD-10-CM | POA: Diagnosis not present

## 2019-05-23 DIAGNOSIS — E559 Vitamin D deficiency, unspecified: Secondary | ICD-10-CM

## 2019-05-23 DIAGNOSIS — M109 Gout, unspecified: Secondary | ICD-10-CM

## 2019-05-23 DIAGNOSIS — K219 Gastro-esophageal reflux disease without esophagitis: Secondary | ICD-10-CM

## 2019-05-23 DIAGNOSIS — R7309 Other abnormal glucose: Secondary | ICD-10-CM | POA: Diagnosis not present

## 2019-05-23 DIAGNOSIS — N401 Enlarged prostate with lower urinary tract symptoms: Secondary | ICD-10-CM

## 2019-05-23 NOTE — Patient Instructions (Addendum)
Diabetes or even increased sugars put you at 300% increased risk of heart attack and stroke.  ALSO BEING DIABETIC YOU MAY NOT HAVE ANY PAIN WITH A HEART ATTACK.  It is very unlikely that you will have any pain with a heart attack. Likely your symptoms will be very subtle, even for very severe disease.  Your symptoms for a heart attack will likely occur when you exert your self or exercise and include: Shortness of breath Sweating Nausea Dizziness Fast or irregular heart beats Fatigue   It makes me feel better if my diabetics get their heart rate up with exercise once or twice a week and pay close attention to your body. If there is ANY change in your exercise capacity or if you have symptoms above, please STOP and call 911 or call to come to the office.   PLEASE REMEMBER:  Diabetes is preventable! Up to 47 percent of complications and morbidities among individuals with type 2 diabetes can be prevented, delayed, or effectively treated and minimized with regular visits to a health professional, appropriate monitoring and medication, and a healthy diet and lifestyle.   Here is some information to help you keep your heart healthy: Move it! - Aim for 30 mins of activity every day. Take it slowly at first. Talk to [place] before starting any new exercise program.   Lose it.  -Body Mass Index (BMI) can indicate if you need to lose weight. A healthy range is 18.5-24.9. For a BMI calculator, go to [email or web address]  Waist Management -Excess abdominal fat is a risk factor for heart disease, diabetes, asthma, stroke and more. Ideal waist circumference is less than 35" for women and less than 40" for men.   Eat Right -focus on fruits, vegetables, whole grains, and meals you make yourself. Avoid foods with trans fat and high sugar/sodium content.   Snooze or Snore? - Loud snoring can be a sign of sleep apnea, a significant risk factor for high blood pressure, heart attach, stroke, and heart  arrhythmias.  Kick the habit -Quit Smoking! Avoid second hand smoke. A single cigarette raises your blood pressure for 20 mins and increases the risk of heart attack and stroke for the next 24 hours.   Are Aspirin and Supplements right for you? -Add ENTERIC COATED low dose 81 mg Aspirin daily OR can do every other day if you have easy bruising to protect your heart and head. As well as to reduce risk of Colon Cancer by 20 %, Skin Cancer by 26 % , Melanoma by 46% and Pancreatic cancer by 60%  Say "No to Stress -There may be little you can do about problems that cause stress. However, techniques such as long walks, meditation, and exercise can help you manage it.   Start Now! - Make changes one at a time and set reasonable goals to increase your likelihood of success.     Here is some information to help you keep your heart healthy: Move it! - Aim for 30 mins of activity every day. Take it slowly at first. Talk to [place] before starting any new exercise program.   Lose it.  -Body Mass Index (BMI) can indicate if you need to lose weight. A healthy range is 18.5-24.9. For a BMI calculator, go to [email or web address]  Waist Management -Excess abdominal fat is a risk factor for heart disease, diabetes, asthma, stroke and more. Ideal waist circumference is less than 35" for women and less than 40" for men.   Eat Right -focus  on fruits, vegetables, whole grains, and meals you make yourself. Avoid foods with trans fat and high sugar/sodium content.   Snooze or Snore? - Loud snoring can be a sign of sleep apnea, a significant risk factor for high blood pressure, heart attach, stroke, and heart arrhythmias.  Kick the habit -Quit Smoking! Avoid second hand smoke. A single cigarette raises your blood pressure for 20 mins and increases the risk of heart attack and stroke for the next 24 hours.   Are Aspirin and Supplements right for you? -Add ENTERIC COATED low dose 81 mg Aspirin daily OR can do every  other day if you have easy bruising to protect your heart and head. As well as to reduce risk of Colon Cancer by 20 %, Skin Cancer by 26 % , Melanoma by 46% and Pancreatic cancer by 60%  Say "No to Stress -There may be little you can do about problems that cause stress. However, techniques such as long walks, meditation, and exercise can help you manage it.   Start Now! - Make changes one at a time and set reasonable goals to increase your likelihood of success.      Michael Waller , Thank you for taking time to come for your Medicare Wellness Visit. I appreciate your ongoing commitment to your health goals. Please review the following plan we discussed and let me know if I can assist you in the future.   These are the goals we discussed: Goals    . Blood Pressure < 130/80    . Weight (lb) < 200 lb (90.7 kg)       This is a list of the screening recommended for you and due dates:  Health Maintenance  Topic Date Due  . Tetanus Vaccine  07/27/2027  . Flu Shot  Discontinued  . Pneumonia vaccines  Discontinued    The items listed above may not be a complete list of foods and beverages to avoid.

## 2019-05-23 NOTE — Progress Notes (Signed)
MEDICARE ANNUAL WELLNESS VISIT AND FOLLOW UP Assessment:  : Encounter for Medicare annual wellness exam 1 year  Essential hypertension - continue medications, DASH diet, exercise and monitor at home. Call if greater than 130/80.  -     CBC with Diff -     COMPLETE METABOLIC PANEL WITH GFR  Hyperlipidemia, mixed check lipids decrease fatty foods increase activity.  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 Wear hearing aids  Medication management  Seasonal allergic rhinitis due to pollen  BMI 25.0-25.9,adult  Overweight  - long discussion about weight loss, diet, and exercise -recommended diet heavy in fruits and veggies and low in animal meats, cheeses, and dairy products   Over 30 minutes of exam, counseling, chart review, and critical decision making was performed  Future Appointments  Date Time Provider [place]  08/28/2019  9:00 AM Unk Pinto, MD [facility] None     Plan:   During the course of the visit the patient was educated and counseled about appropriate screening and preventive services including:    Pneumococcal vaccine   Influenza vaccine  Prevnar 13  Td vaccine  Screening electrocardiogram  Colorectal cancer screening  Diabetes screening  Glaucoma screening  Nutrition counseling    Subjective:  Michael Waller is a 78  y.o. presents for medicare annual welllnesss and 3 month follow up on hypertension, HLD, pre-diabetes, weight and vitamin D deficiency,GERD and gout controlled by meds.    He was started on gluacoma eye drops from Dr. Sabra Heck x 8 weeks.   His blood pressure has been controlled at home, he is on cardura and ziac and enalapril, and mindoxidil 2.39m, today their BP is BP: 136/66  BMI is Body mass index is 25.9 kg/m., he is working on diet and exercise. Wt Readings from Last 3 Encounters:  05/23/19 208 lb 9.6 oz (94.6 kg)  02/21/19 201 lb 6.4 oz (91.4 kg)  11/21/18 203 lb (92.1 kg)   He does not workout. He denies chest pain, shortness of breath, dizziness.  He is on cholesterol medication, lipitor and denies myalgias. His cholesterol is at goal. The cholesterol last visit was:   Lab Results  Component Value Date   CHOL 148 02/21/2019   HDL 55 02/21/2019   LDLCALC 80 02/21/2019   TRIG 57 02/21/2019   CHOLHDL 2.7 02/21/2019   He has not been working on diet and exercise for prediabetes, and denies hyperglycemia, hypoglycemia , nausea, polydipsia, polyuria and vomiting. Last A1C in the office was:  Lab Results  Component Value Date   HGBA1C 5.6 02/21/2019   Last GFR Lab Results  Component Value Date   GFRNONAA 84 02/21/2019    Patient is on Vitamin D supplement.   Lab Results  Component Value Date   VD25OH 84 02/21/2019      Medication Review:   Current Outpatient Medications (Cardiovascular):  .  atorvastatin (LIPITOR) 20 MG tablet, Take 1 tablet (20 mg total) by mouth daily. (Patient taking differently: Take 20 mg  by mouth daily. Takes 1/2 tablet daily) .  bisoprolol-hydrochlorothiazide (ZIAC) 10-6.25 MG tablet, Take 1 tablet Daily for BP (Patient taking differently: Take 1/2 tablet Daily for BP) .  doxazosin (CARDURA) 8 MG tablet, TAKE 1 TABLET BY MOUTH DAILY (Patient taking differently: Take 1/2 tablet daily) .  enalapril (VASOTEC) 20 MG tablet, Take 1 tablet 2 x /day for  BP .  minoxidil (LONITEN) 2.5 MG tablet, Take 1 to 2 tablets ever morning for BP  Current Outpatient Medications (Respiratory):  .  loratadine (CLARITIN) 10 MG tablet, Take 10 mg by mouth daily as needed.  Current Outpatient Medications (Analgesics):  .  allopurinol (ZYLOPRIM) 300 MG tablet, TAKE 1 TABLET BY MOUTH DAILY .  aspirin 81 MG tablet, Take 81 mg by mouth daily.   Current Outpatient Medications (Other):  Marland Kitchen  Alfalfa 650 MG TABS, Take 2 tablets by mouth daily. .  blood glucose meter kit and supplies, Dispense based insurance preference. E11.9 .  Cholecalciferol (VITAMIN D3) 2000 units TABS, Take 3 tablets by mouth daily. .  Cinnamon 500 MG TABS, Take 2 tablets by mouth daily. .  finasteride (PROSCAR) 5 MG tablet, Take 5 mg by mouth every evening.  .  Garlic 4403 MG CAPS, Take 1,000 mg by mouth daily. Marland Kitchen  glucose blood (ONE TOUCH ULTRA TEST) test strip, USE TO CHECK BLOOD SUGAR ONCE DAILY .  glucose blood (ONETOUCH ULTRA) test strip, Check  Blood Sugar Daily (Dx: E11.9) .  glucose blood test strip, Check blood sugar once daily .  latanoprost (XALATAN) 0.005 % ophthalmic solution, Place 1 drop into both eyes daily. .  Magnesium 250 MG TABS, Take 250 mg by mouth 2 (two) times daily. Breakfast and lunch .  Multiple Vitamin (MULTIVITAMIN) tablet, Take 1 tablet by mouth daily. .  Omega-3 Fatty Acids (OMEGA-3 FISH OIL) 1200 MG CAPS, Take 1 capsule by mouth daily with lunch. Glory [place] Delica Lancets 47Q MISC, Check blood sugar 1 time a day. Marland Kitchen  omeprazole (PRILOSEC) 20 MG capsule, Take 1 capsule (20 mg total) by mouth daily.  Allergies: Allergies  Allergen Reactions  . Adhesive [Tape] Other (See Comments)    reddness    Current Problems (verified) has Essential hypertension; Mixed hyperlipidemia; Prediabetes; Vitamin D deficiency; Benign prostatic hyperplasia; Idiopathic gout; Medicare annual wellness visit, subsequent; Gout; Gastroesophageal reflux disease without esophagitis; and  Seasonal allergic rhinitis due to pollen on their problem list.  Screening Tests Immunization History  Administered Date(s) Administered  . DTaP 08/31/2005  . PPD Test 03/17/2014  . Td 07/26/2017    Preventative care: Last colonoscopy: 2019  Prior vaccinations: TD or Tdap: 2019 Influenza: Declined Pneumococcal: Declined Prevnar13: Declined Shingles/Zostavax: Declined  Names of Other Physician/Practitioners you currently use: 1. [facility] here for primary care  Patient Care Team: Unk Pinto, MD as PCP - General (Internal Medicine) Renda Rolls, Jennefer Bravo, MD as Referring Physician (Dermatology) Gatha Mayer, MD as Consulting Physician (Gastroenterology) Irine Seal, MD as Attending Physician (Urology) Marica Otter, [place] (Optometry)  Surgical: He  has a past surgical history that includes Prostate biopsy (2000, 12/2004); Extracorporeal shock wave lithotripsy [id number]); Hand ligament reconstruction (Right, 2009); Cystoscopy with retrograde pyelogram, ureteroscopy and stent placement (Bilateral, 04/19/2016); and Colonoscopy (2008). Family His family history includes Breast cancer in his sister; Cancer in his sister and sister; Colon cancer in his sister; Diabetes in his brother, brother, brother, father, mother, and sister; Heart attack in his father; Heart disease in his brother, father, and  mother; Pancreatic cancer in his brother; Stroke in his brother. Social history  He reports that he quit smoking about 54 years ago. His smoking use included cigarettes. He quit after 5.00 years of use. He quit smokeless tobacco use about 53 years ago.  His smokeless tobacco use included chew. He reports that he does not drink alcohol or use drugs.  MEDICARE WELLNESS OBJECTIVES: Physical activity: Current Exercise Habits: The patient does not participate in regular exercise at present(he will occ walk around his land) Cardiac risk factors:  Cardiac Risk Factors include: advanced age (>19mn, >>70women);dyslipidemia;hypertension;male gender;sedentary lifestyle Depression/mood screen:   [facility] 05/23/2019  Decreased Interest 0  Down, Depressed, Hopeless 0  PHQ - 2 Score 0  Altered sleeping -  Tired, decreased energy -  Change in appetite -  Feeling bad or failure about yourself  -  Trouble concentrating -  Moving slowly or fidgety/restless -  Suicidal thoughts -  PHQ-9 Score -  Difficult doing work/chores -    ADLs:  In your present state of health, do you have any difficulty performing the following activities: 05/23/2019 02/20/2019  Hearing? Y N  Comment has hearing aids but does not always wear them -  Vision? N N  Difficulty concentrating or making decisions? N N  Walking or climbing stairs? N N  Dressing or bathing? N N  Doing errands, shopping? N N  Some recent data might be hidden     Cognitive Testing  Alert? Yes  Normal Appearance?Yes  Oriented to person? Yes  Place? Yes   Time? Yes  Recall of three objects?  Yes  Can perform simple calculations? Yes  Displays appropriate judgment?Yes  Can read the correct time from a watch face?Yes  EOL planning: Does Patient Have a Medical Advance Directive?: No Does patient want to make changes to medical advance directive?: No - Patient declined   Objective:   Today's Vitals   05/23/19 0935  BP: 136/66  Pulse: (!) 54  Resp: 16  Temp: (!) 97.1 F (36.2 C)  Weight: 208 lb 9.6 oz (94.6 kg)  Height: 6' 3.25" (1.911 m)   Body mass index is 25.9 kg/m.  General appearance: alert, no distress, WD/WN, male HEENT: normocephalic, sclerae anicteric, TMs pearly, nares patent, no discharge or erythema, pharynx normal Oral cavity: MMM, no lesions Neck: supple, no lymphadenopathy, no thyromegaly, no masses Heart: RRR, normal S1, S2, no murmurs Lungs: CTA bilaterally, no wheezes, rhonchi, or rales Abdomen: +bs, soft, non tender, non distended, no  masses, no hepatomegaly, no splenomegaly Musculoskeletal: nontender, no swelling, no obvious deformity Extremities: no edema, no cyanosis, no clubbing Pulses: 2+ symmetric, upper and lower extremities, normal cap refill Neurological: alert, oriented x 3, CN2-12 intact, strength normal upper extremities and lower extremities, sensation normal throughout, DTRs 2+ throughout, no cerebellar signs, gait normal Psychiatric: normal affect, behavior normal, pleasant   Medicare Attestation I have personally reviewed: The patient's medical and social history Their use of alcohol, tobacco or illicit drugs Their current medications and supplements The patient's functional ability including ADLs,fall risks, home safety risks, cognitive, and hearing and visual impairment Diet and physical activities Evidence for depression or mood disorders  The patient's weight, height, BMI, and visual acuity have been recorded in the chart.  I have made referrals, counseling, and provided education to the patient based on review of the above and I have provided the patient with a written personalized care plan for preventive services.     AVicie Mutters  PA-C   05/23/2019   1:12 PM [facility]

## 2019-05-24 LAB — CBC WITH DIFFERENTIAL/PLATELET
Absolute Monocytes: 614 cells/uL (ref 200–950)
Basophils Absolute: 33 cells/uL (ref 0–200)
Basophils Relative: 0.5 %
Eosinophils Absolute: 139 cells/uL (ref 15–500)
Eosinophils Relative: 2.1 %
HCT: 44 % (ref 38.5–50.0)
Hemoglobin: 14.7 g/dL (ref 13.2–17.1)
Lymphs Abs: 1637 cells/uL (ref 850–3900)
MCH: 31.2 pg (ref 27.0–33.0)
MCHC: 33.4 g/dL (ref 32.0–36.0)
MCV: 93.4 fL (ref 80.0–100.0)
MPV: 10.6 fL (ref 7.5–12.5)
Monocytes Relative: 9.3 %
Neutro Abs: 4178 cells/uL (ref 1500–7800)
Neutrophils Relative %: 63.3 %
Platelets: 178 10*3/uL (ref 140–400)
RBC: 4.71 10*6/uL (ref 4.20–5.80)
RDW: 12.3 % (ref 11.0–15.0)
Total Lymphocyte: 24.8 %
WBC: 6.6 10*3/uL (ref 3.8–10.8)

## 2019-05-24 LAB — LIPID PANEL
Cholesterol: 150 mg/dL (ref <200)
HDL: 48 mg/dL (ref >=40)
LDL Cholesterol (Calc): 85 mg/dL (calc)
Non-HDL Cholesterol (Calc): 102 mg/dL (calc) (ref <130)
Total CHOL/HDL Ratio: 3.1 (calc) (ref <5.0)
Triglycerides: 82 mg/dL (ref <150)

## 2019-05-24 LAB — COMPLETE METABOLIC PANEL WITH GFR
AG Ratio: 2.3 (calc) (ref 1.0–2.5)
ALT: 23 U/L (ref 9–46)
AST: 22 U/L (ref 10–35)
Albumin: 4.5 g/dL (ref 3.6–5.1)
Alkaline phosphatase (APISO): 65 U/L (ref 35–144)
BUN: 14 mg/dL (ref 7–25)
CO2: 31 mmol/L (ref 20–32)
Calcium: 9.8 mg/dL (ref 8.6–10.3)
Chloride: 102 mmol/L (ref 98–110)
Creat: 0.84 mg/dL (ref 0.70–1.18)
GFR, Est African American: 97 mL/min/{1.73_m2} (ref >=60)
GFR, Est Non African American: 84 mL/min/{1.73_m2} (ref >=60)
Globulin: 2 g/dL (calc) (ref 1.9–3.7)
Glucose, Bld: 101 mg/dL — ABNORMAL HIGH (ref 65–99)
Potassium: 4.4 mmol/L (ref 3.5–5.3)
Sodium: 141 mmol/L (ref 135–146)
Total Bilirubin: 0.8 mg/dL (ref 0.2–1.2)
Total Protein: 6.5 g/dL (ref 6.1–8.1)

## 2019-05-24 LAB — HEMOGLOBIN A1C
Hgb A1c MFr Bld: 5.6 % of total Hgb (ref <5.7)
Mean Plasma Glucose: 114 (calc)
eAG (mmol/L): 6.3 (calc)

## 2019-05-27 ENCOUNTER — Other Ambulatory Visit: Payer: Self-pay | Admitting: Physician Assistant

## 2019-05-28 ENCOUNTER — Ambulatory Visit: Payer: Self-pay | Admitting: Adult Health Nurse Practitioner

## 2019-06-03 ENCOUNTER — Telehealth: Payer: Self-pay | Admitting: *Deleted

## 2019-06-03 ENCOUNTER — Other Ambulatory Visit: Payer: Self-pay | Admitting: Internal Medicine

## 2019-06-03 NOTE — Telephone Encounter (Signed)
Patient called and states he was taking 1/2 of a 40 mg Atorvastatin and last refill was for a 10 mg tablet. Per Dr Melford Aase, the patient should finis the 40 mg 1/2 tablet and then start the 10 mg until his 08/2019 CPE, due to his excellent cholestetol results.  Patient is aware.

## 2019-06-11 ENCOUNTER — Other Ambulatory Visit: Payer: Self-pay | Admitting: Adult Health Nurse Practitioner

## 2019-06-11 DIAGNOSIS — K219 Gastro-esophageal reflux disease without esophagitis: Secondary | ICD-10-CM

## 2019-06-14 ENCOUNTER — Ambulatory Visit: Payer: PPO | Attending: Internal Medicine

## 2019-06-14 ENCOUNTER — Other Ambulatory Visit: Payer: Self-pay

## 2019-06-14 DIAGNOSIS — Z23 Encounter for immunization: Secondary | ICD-10-CM | POA: Insufficient documentation

## 2019-06-14 NOTE — Progress Notes (Signed)
   Covid-19 Vaccination Clinic  Name:  Michael Waller    MRN: [id number] DOB: 27-Sep-1940  06/14/2019  Michael Waller was observed post Covid-19 immunization for 15 minutes without incidence. He was provided with Vaccine Information Sheet and instruction to access the V-Safe system.   Michael Waller was instructed to call 911 with any severe reactions post vaccine: Marland Kitchen Difficulty breathing  . Swelling of your face and throat  . A fast heartbeat  . A bad rash all over your body  . Dizziness and weakness    Immunizations Administered    Name Date Dose VIS Date Route   Pfizer COVID-19 Vaccine 06/14/2019  8:49 AM 0.3 mL 05/03/2019 Intramuscular   Manufacturer: [place]   Lot: [id number]   [place]: [id number]

## 2019-06-18 ENCOUNTER — Other Ambulatory Visit: Payer: Self-pay | Admitting: Internal Medicine

## 2019-07-04 ENCOUNTER — Ambulatory Visit: Payer: PPO | Attending: Internal Medicine

## 2019-07-04 DIAGNOSIS — Z23 Encounter for immunization: Secondary | ICD-10-CM | POA: Insufficient documentation

## 2019-07-04 NOTE — Progress Notes (Signed)
   Covid-19 Vaccination Clinic  Name:  Michael Waller    MRN: [id number] DOB: 1941-02-04  07/04/2019  [name] was observed post Covid-19 immunization for 15 minutes without incidence. He was provided with Vaccine Information Sheet and instruction to access the V-Safe system.   [name] was instructed to call 911 with any severe reactions post vaccine: Marland Kitchen Difficulty breathing  . Swelling of your face and throat  . A fast heartbeat  . A bad rash all over your body  . Dizziness and weakness    Immunizations Administered    Name Date Dose VIS Date Route   Pfizer COVID-19 Vaccine 07/04/2019  1:27 PM 0.3 mL 05/03/2019 Intramuscular   Manufacturer: [place]   Lot: [id number]   [place]: [id number]

## 2019-07-22 ENCOUNTER — Other Ambulatory Visit: Payer: Self-pay | Admitting: Internal Medicine

## 2019-07-22 DIAGNOSIS — I10 Essential (primary) hypertension: Secondary | ICD-10-CM

## 2019-08-27 ENCOUNTER — Encounter: Payer: Self-pay | Admitting: Internal Medicine

## 2019-08-27 NOTE — Progress Notes (Signed)
Annual  Screening/Preventative Visit  & Comprehensive Evaluation & Examination     This very nice 79 y.o. [name] presents for a Screening /Preventative Visit & comprehensive evaluation and management of multiple medical co-morbidities.  Patient has been followed for HTN, HLD, Prediabetes and Vitamin D Deficiency. Patient has Gout controlled on Allopurinol. He also has GERD quiescent on Prilosec. His BPH / LUTS is controlled on Doxazosin & Finasteride.      HTN predates since 62. Patient's BP has been controlled at home.  Today's BP is at goal - 112/56. Patient denies any cardiac symptoms as chest pain, palpitations, shortness of breath, dizziness or ankle swelling.     Patient's hyperlipidemia is controlled with diet and Atorvastatin. Patient denies myalgias or other medication SE's. Last lipids were at goal:  Lab Results  Component Value Date   CHOL 150 05/23/2019   HDL 48 05/23/2019   LDLCALC 85 05/23/2019   TRIG 82 05/23/2019   CHOLHDL 3.1 05/23/2019       Patient has hx/o prediabetes (A1c 5.8% / 2006)  and patient denies reactive hypoglycemic symptoms, visual blurring, diabetic polys or paresthesias. Last A1c was Normal & at goal:  Lab Results  Component Value Date   HGBA1C 5.6 05/23/2019        Finally, patient has history of Vitamin D Deficiency ("49" on Tx / 2008)  and last vitamin D was at goal:  Lab Results  Component Value Date   VD25OH 84 02/21/2019    Current Outpatient Medications on File Prior to Visit  Medication Sig  . Alfalfa 650 MG TABS Take 2 tablets by mouth daily.  Marland Kitchen allopurinol (ZYLOPRIM) 300 MG tablet Take 1 tablet Daily for Gout  . aspirin 81 MG tablet Take 81 mg by mouth daily.  Marland Kitchen atorvastatin (LIPITOR) 10 MG tablet Take 1 tablet Daily for Cholesterol  . bisoprolol-hydrochlorothiazide (ZIAC) 10-6.25 MG tablet Take 1 tablet Daily for BP (Patient taking differently: Take 1/2 tablet Daily for BP)  . blood glucose meter kit and supplies Dispense based  insurance preference. E11.9  . Cholecalciferol (VITAMIN D3) 2000 units TABS Take 3 tablets by mouth daily.  . Cinnamon 500 MG TABS Take 2 tablets by mouth daily.  Marland Kitchen doxazosin (CARDURA) 8 MG tablet Take 1 tablet at Bedtime for BP & Prostate  . enalapril (VASOTEC) 20 MG tablet Take 1 tablet   2 x /day for BP  . finasteride (PROSCAR) 5 MG tablet Take 5 mg by mouth every evening.   . Garlic 3546 MG CAPS Take 1,000 mg by mouth daily.  Marland Kitchen glucose blood (ONE TOUCH ULTRA TEST) test strip USE TO CHECK BLOOD SUGAR ONCE DAILY  . glucose blood (ONETOUCH ULTRA) test strip Check  Blood Sugar Daily (Dx: E11.9)  . glucose blood test strip Check blood sugar once daily  . latanoprost (XALATAN) 0.005 % ophthalmic solution Place 1 drop into both eyes daily.  Marland Kitchen loratadine (CLARITIN) 10 MG tablet Take 10 mg by mouth daily as needed.  . Magnesium 250 MG TABS Take 250 mg by mouth 2 (two) times daily. Breakfast and lunch  . minoxidil (LONITEN) 2.5 MG tablet Take 1 to 2 tablets ever morning for BP  . Multiple Vitamin (MULTIVITAMIN) tablet Take 1 tablet by mouth daily.  . Omega-3 Fatty Acids (OMEGA-3 FISH OIL) 1200 MG CAPS Take 1 capsule by mouth daily with lunch.  Marland Kitchen omeprazole (PRILOSEC) 20 MG capsule Take 1 capsule Daily for Heartburn & Indigestion  . OneTouch Delica Lancets 56C MISC  Check blood sugar 1 time a day.   No current facility-administered medications on file prior to visit.   Allergies  Allergen Reactions  . Adhesive [Tape] Other (See Comments)    reddness   Past Medical History:  Diagnosis Date  . Benign localized prostatic hyperplasia with lower urinary tract symptoms (LUTS)   . Bruises easily   . Elevated PSA   . GERD (gastroesophageal reflux disease)   . History of gout   . History of kidney stones   . Hyperlipidemia   . Hypertension   . Nephrolithiasis   . Nocturia   . Pre-diabetes   . Vitamin D deficiency   . Wears dentures    full upper and lower partial    Health Maintenance    Topic Date Due  . TETANUS/TDAP  07/27/2027  . INFLUENZA VACCINE  Discontinued  . PNA vac Low Risk Adult  Discontinued   Immunization History  Administered Date(s) Administered  . DTaP 08/31/2005  . PFIZER SARS-COV-2 Vaccination 06/14/2019, 07/04/2019  . PPD Test 03/17/2014  . Td 07/26/2017   Last Colon -  12/26/2017 - Negative & Dr Carlean Purl- recc no f/u due to age.  Past Surgical History:  Procedure Laterality Date  . COLONOSCOPY  2008  . CYSTOSCOPY WITH RETROGRADE PYELOGRAM, URETEROSCOPY AND STENT PLACEMENT Bilateral 04/19/2016   Procedure: CYSTOSCOPY WITH BILATERAL RETROGRADE URETEROSCOPY BASKET EXTRACTION;  Surgeon: Irine Seal, MD;  Location: [facility];  Service: Urology;  Laterality: Bilateral;  . EXTRACORPOREAL SHOCK WAVE LITHOTRIPSY  [phone]  . HAND LIGAMENT RECONSTRUCTION Right 2009  . PROSTATE BIOPSY  2000, 12/2004   negative   Family History  Problem Relation Age of Onset  . Heart disease Mother   . Diabetes Mother   . Diabetes Father   . Heart disease Father   . Heart attack Father   . Diabetes Sister   . Colon cancer Sister   . Diabetes Brother   . Pancreatic cancer Brother   . Diabetes Brother   . Diabetes Brother   . Heart disease Brother   . Stroke Brother   . Cancer Sister        breast  . Breast cancer Sister   . Cancer Sister        colon  . Stomach cancer Neg Hx    Social History   Socioeconomic History  . Marital status: Married    Spouse name: Enid Derry  . Number of children: None  Occupational History  . 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 Smoking status: Former Smoker    Years: 5.00    Types: Cigarettes    Quit date: 05/23/1965    Years since quitting: 54.2  . Smokeless tobacco: Former Systems developer    Types: [place] date: 04/11/1966  Substance and Sexual Activity  . Alcohol use: No  . Drug use: No  . Sexual activity: Not on file  Other Topics Concern  . 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   1 caffeinated beverage daily.   Former user of smokeless tobacco and former smoker no current smoking drug use or alcohol      ROS Constitutional: Denies fever, chills, weight loss/gain, headaches, insomnia,  night sweats or change in appetite. Does c/o fatigue. Eyes: Denies redness, blurred vision,  diplopia, discharge, itchy or watery eyes.  ENT: Denies discharge, congestion, post nasal drip, epistaxis, sore throat, earache, hearing loss, dental pain, Tinnitus, Vertigo, Sinus pain or snoring.  Cardio: Denies chest pain, palpitations, irregular heartbeat, syncope, dyspnea, diaphoresis, orthopnea, PND, claudication or edema Respiratory: denies cough, dyspnea, DOE, pleurisy, hoarseness, laryngitis or wheezing.  Gastrointestinal: Denies dysphagia, heartburn, reflux, water brash, pain, cramps, nausea, vomiting, bloating, diarrhea, constipation, hematemesis, melena, hematochezia, jaundice or hemorrhoids Genitourinary: Denies dysuria, frequency, urgency, nocturia, hesitancy, discharge, hematuria or flank pain Musculoskeletal: Denies arthralgia, myalgia, stiffness, Jt. Swelling, pain, limp or strain/sprain. Denies Falls. Skin: Denies puritis, rash, hives, warts, acne, eczema or change in skin lesion Neuro: No weakness, tremor, incoordination, spasms, paresthesia or pain Psychiatric: Denies confusion, memory loss or sensory loss. Denies Depression. Endocrine: Denies change in weight, skin, hair change, nocturia, and paresthesia, diabetic polys, visual blurring or hyper / hypo glycemic episodes.  Heme/Lymph: No excessive bleeding, bruising or enlarged lymph nodes.  Physical Exam  BP (!) 112/56   Pulse (!) 52   Temp (!) 96.5 F (35.8 C)   Resp 16   Ht 6' 3.5" (1.918 m)   Wt 204 lb 6.4 oz (92.7 kg)   BMI 25.21 kg/m   General Appearance:  Well nourished and well groomed and in no apparent distress.  Eyes: PERRLA, EOMs, conjunctiva no swelling or erythema, normal fundi and vessels. Sinuses: No frontal/maxillary tenderness ENT/Mouth: EACs patent / TMs  nl. Nares clear without erythema, swelling, mucoid exudates. Oral hygiene is good. No erythema, swelling, or exudate. Tongue normal, non-obstructing. Tonsils not swollen or erythematous. Hearing normal.  Neck: Supple, thyroid not palpable. No bruits, nodes or JVD. Respiratory: Respiratory effort normal.  BS equal and clear bilateral without rales, rhonci, wheezing or stridor. Cardio: Heart sounds are normal with regular rate and rhythm and no murmurs, rubs or gallops. Peripheral pulses are normal and equal bilaterally without edema. No aortic or femoral bruits. Chest: symmetric with normal excursions and percussion.  Abdomen: Soft, with Nl bowel sounds. Nontender, no guarding, rebound, hernias, masses, or organomegaly.  Lymphatics: Non tender without lymphadenopathy.  Musculoskeletal: Full ROM all peripheral extremities, joint stability, 5/5 strength, and normal gait. Skin: Warm and dry without rashes, lesions, cyanosis, clubbing or  ecchymosis.  Neuro: Cranial nerves intact, reflexes equal bilaterally. Normal muscle tone, no cerebellar symptoms. Sensation intact.  Pysch: Alert and oriented X 3 with normal affect, insight and judgment appropriate.   Assessment and Plan  1. Annual Preventative/Screening Exam   1. Encounter for general adult medical examination with abnormal findings  2. Essential hypertension  - EKG 12-Lead - [place], RETROPERITNL ABD,  LTD - Urinalysis, Routine w reflex microscopic - CBC with Differential/Platelet - COMPLETE METABOLIC PANEL WITH GFR - Magnesium - TSH  3. Hyperlipidemia, mixed  - EKG 12-Lead - [place], RETROPERITNL ABD,  LTD - Lipid panel - TSH  4. Abnormal glucose  - EKG 12-Lead - [place], RETROPERITNL ABD,  LTD - Hemoglobin A1c - Insulin,  random  5. Vitamin D deficiency  - VITAMIN D 25 Hydroxy  6. Prediabetes  - EKG 12-Lead - [place], RETROPERITNL ABD,  LTD - Hemoglobin A1c - Insulin, random  7. Gout  - Uric acid  8. Gastroesophageal reflux disease without esophagitis   9. BPH with obstruction/lower urinary tract symptoms  - PSA  10. Prostate cancer screening  - PSA  11. Screening for colorectal cancer  - POC Hemoccult Bld/Stl   12. Screening for ischemic heart disease  - EKG 12-Lead  13. FHx:  heart disease  - EKG 12-Lead - [place], RETROPERITNL ABD,  LTD  14. Former smoker  - EKG 12-Lead  15. Screening for AAA (aortic abdominal aneurysm)  - [place], RETROPERITNL ABD,  LTD  16. Medication management  - Urinalysis, Routine w reflex microscopic - Microalbumin / creatinine urine ratio - Uric acid - CBC with Differential/Platelet - COMPLETE METABOLIC PANEL WITH GFR - Magnesium - Lipid panel - TSH - Hemoglobin A1c - Insulin, random - VITAMIN D 25 Hydroxy        Patient was counseled in prudent diet, weight control to achieve/maintain BMI less than 25, BP monitoring, regular exercise and medications as discussed.  Discussed med effects and SE's. Routine screening labs and tests as requested with regular follow-up as recommended. Over 40 minutes of exam, counseling, chart review and high complex critical decision making was performed   Kirtland Bouchard, MD

## 2019-08-27 NOTE — Patient Instructions (Signed)

## 2019-08-28 ENCOUNTER — Ambulatory Visit (INDEPENDENT_AMBULATORY_CARE_PROVIDER_SITE_OTHER): Payer: PPO | Admitting: Internal Medicine

## 2019-08-28 ENCOUNTER — Other Ambulatory Visit: Payer: Self-pay

## 2019-08-28 VITALS — BP 112/56 | HR 52 | Temp 96.5°F | Resp 16 | Ht 75.5 in | Wt 204.4 lb

## 2019-08-28 DIAGNOSIS — I10 Essential (primary) hypertension: Secondary | ICD-10-CM

## 2019-08-28 DIAGNOSIS — Z8249 Family history of ischemic heart disease and other diseases of the circulatory system: Secondary | ICD-10-CM | POA: Diagnosis not present

## 2019-08-28 DIAGNOSIS — E782 Mixed hyperlipidemia: Secondary | ICD-10-CM

## 2019-08-28 DIAGNOSIS — Z125 Encounter for screening for malignant neoplasm of prostate: Secondary | ICD-10-CM | POA: Diagnosis not present

## 2019-08-28 DIAGNOSIS — Z79899 Other long term (current) drug therapy: Secondary | ICD-10-CM

## 2019-08-28 DIAGNOSIS — N138 Other obstructive and reflux uropathy: Secondary | ICD-10-CM | POA: Diagnosis not present

## 2019-08-28 DIAGNOSIS — Z0001 Encounter for general adult medical examination with abnormal findings: Secondary | ICD-10-CM

## 2019-08-28 DIAGNOSIS — M109 Gout, unspecified: Secondary | ICD-10-CM | POA: Diagnosis not present

## 2019-08-28 DIAGNOSIS — Z136 Encounter for screening for cardiovascular disorders: Secondary | ICD-10-CM | POA: Diagnosis not present

## 2019-08-28 DIAGNOSIS — Z1211 Encounter for screening for malignant neoplasm of colon: Secondary | ICD-10-CM

## 2019-08-28 DIAGNOSIS — N401 Enlarged prostate with lower urinary tract symptoms: Secondary | ICD-10-CM | POA: Diagnosis not present

## 2019-08-28 DIAGNOSIS — Z0000 Encounter for general adult medical examination without abnormal findings: Secondary | ICD-10-CM | POA: Diagnosis not present

## 2019-08-28 DIAGNOSIS — R7303 Prediabetes: Secondary | ICD-10-CM

## 2019-08-28 DIAGNOSIS — E559 Vitamin D deficiency, unspecified: Secondary | ICD-10-CM

## 2019-08-28 DIAGNOSIS — R7309 Other abnormal glucose: Secondary | ICD-10-CM

## 2019-08-28 DIAGNOSIS — Z87891 Personal history of nicotine dependence: Secondary | ICD-10-CM

## 2019-08-28 DIAGNOSIS — K219 Gastro-esophageal reflux disease without esophagitis: Secondary | ICD-10-CM

## 2019-08-29 LAB — COMPLETE METABOLIC PANEL WITH GFR
AG Ratio: 2.3 (calc) (ref 1.0–2.5)
ALT: 26 U/L (ref 9–46)
AST: 27 U/L (ref 10–35)
Albumin: 4.6 g/dL (ref 3.6–5.1)
Alkaline phosphatase (APISO): 60 U/L (ref 35–144)
BUN: 20 mg/dL (ref 7–25)
CO2: 30 mmol/L (ref 20–32)
Calcium: 9.5 mg/dL (ref 8.6–10.3)
Chloride: 100 mmol/L (ref 98–110)
Creat: 0.83 mg/dL (ref 0.70–1.18)
GFR, Est African American: 98 mL/min/{1.73_m2} (ref >=60)
GFR, Est Non African American: 84 mL/min/{1.73_m2} (ref >=60)
Globulin: 2 g/dL (calc) (ref 1.9–3.7)
Glucose, Bld: 104 mg/dL — ABNORMAL HIGH (ref 65–99)
Potassium: 4.4 mmol/L (ref 3.5–5.3)
Sodium: 137 mmol/L (ref 135–146)
Total Bilirubin: 0.9 mg/dL (ref 0.2–1.2)
Total Protein: 6.6 g/dL (ref 6.1–8.1)

## 2019-08-29 LAB — CBC WITH DIFFERENTIAL/PLATELET
Absolute Monocytes: 536 cells/uL (ref 200–950)
Basophils Absolute: 32 cells/uL (ref 0–200)
Basophils Relative: 0.5 %
Eosinophils Absolute: 101 cells/uL (ref 15–500)
Eosinophils Relative: 1.6 %
HCT: 40.6 % (ref 38.5–50.0)
Hemoglobin: 13.6 g/dL (ref 13.2–17.1)
Lymphs Abs: 1550 cells/uL (ref 850–3900)
MCH: 32 pg (ref 27.0–33.0)
MCHC: 33.5 g/dL (ref 32.0–36.0)
MCV: 95.5 fL (ref 80.0–100.0)
MPV: 10.2 fL (ref 7.5–12.5)
Monocytes Relative: 8.5 %
Neutro Abs: 4082 cells/uL (ref 1500–7800)
Neutrophils Relative %: 64.8 %
Platelets: 207 10*3/uL (ref 140–400)
RBC: 4.25 10*6/uL (ref 4.20–5.80)
RDW: 13.1 % (ref 11.0–15.0)
Total Lymphocyte: 24.6 %
WBC: 6.3 10*3/uL (ref 3.8–10.8)

## 2019-08-29 LAB — LIPID PANEL
Cholesterol: 169 mg/dL (ref <200)
HDL: 54 mg/dL (ref >=40)
LDL Cholesterol (Calc): 99 mg/dL (calc)
Non-HDL Cholesterol (Calc): 115 mg/dL (calc) (ref <130)
Total CHOL/HDL Ratio: 3.1 (calc) (ref <5.0)
Triglycerides: 75 mg/dL (ref <150)

## 2019-08-29 LAB — URINALYSIS, ROUTINE W REFLEX MICROSCOPIC
Bilirubin Urine: NEGATIVE
Glucose, UA: NEGATIVE
Hgb urine dipstick: NEGATIVE
Ketones, ur: NEGATIVE
Leukocytes,Ua: NEGATIVE
Nitrite: NEGATIVE
Protein, ur: NEGATIVE
Specific Gravity, Urine: 1.01 (ref 1.001–1.03)
pH: 5.5 (ref 5.0–8.0)

## 2019-08-29 LAB — INSULIN, RANDOM: Insulin: 5.3 u[IU]/mL

## 2019-08-29 LAB — VITAMIN D 25 HYDROXY (VIT D DEFICIENCY, FRACTURES): Vit D, 25-Hydroxy: 75 ng/mL (ref 30–100)

## 2019-08-29 LAB — MICROALBUMIN / CREATININE URINE RATIO
Creatinine, Urine: 44 mg/dL (ref 20–320)
Microalb, Ur: <0.2 mg/dL

## 2019-08-29 LAB — URIC ACID: Uric Acid, Serum: 6.4 mg/dL (ref 4.0–8.0)

## 2019-08-29 LAB — HEMOGLOBIN A1C
Hgb A1c MFr Bld: 5.6 % of total Hgb (ref <5.7)
Mean Plasma Glucose: 114 (calc)
eAG (mmol/L): 6.3 (calc)

## 2019-08-29 LAB — PSA: PSA: 0.9 ng/mL (ref <=4.0)

## 2019-08-29 LAB — MAGNESIUM: Magnesium: 1.9 mg/dL (ref 1.5–2.5)

## 2019-08-29 LAB — TSH: TSH: 1.3 mIU/L (ref 0.40–4.50)

## 2019-08-30 ENCOUNTER — Other Ambulatory Visit: Payer: Self-pay | Admitting: Internal Medicine

## 2019-09-12 ENCOUNTER — Other Ambulatory Visit: Payer: Self-pay | Admitting: *Deleted

## 2019-09-12 DIAGNOSIS — Z1211 Encounter for screening for malignant neoplasm of colon: Secondary | ICD-10-CM | POA: Diagnosis not present

## 2019-09-12 DIAGNOSIS — Z1212 Encounter for screening for malignant neoplasm of rectum: Secondary | ICD-10-CM

## 2019-09-12 LAB — POC HEMOCCULT BLD/STL (HOME/3-CARD/SCREEN)
Card #2 Fecal Occult Blod, POC: NEGATIVE
Card #3 Fecal Occult Blood, POC: NEGATIVE
Fecal Occult Blood, POC: NEGATIVE

## 2019-10-18 ENCOUNTER — Other Ambulatory Visit: Payer: Self-pay | Admitting: Internal Medicine

## 2019-10-18 DIAGNOSIS — I10 Essential (primary) hypertension: Secondary | ICD-10-CM

## 2019-11-26 DIAGNOSIS — H5213 Myopia, bilateral: Secondary | ICD-10-CM | POA: Diagnosis not present

## 2019-11-26 DIAGNOSIS — H524 Presbyopia: Secondary | ICD-10-CM | POA: Diagnosis not present

## 2019-11-26 DIAGNOSIS — H52223 Regular astigmatism, bilateral: Secondary | ICD-10-CM | POA: Diagnosis not present

## 2019-11-26 DIAGNOSIS — H401131 Primary open-angle glaucoma, bilateral, mild stage: Secondary | ICD-10-CM | POA: Diagnosis not present

## 2019-11-28 NOTE — Progress Notes (Signed)
FOLLOW UP  Assessment and Plan:   Hypertension Well controlled with current medications - Monitor blood pressure at home; patient to call if consistently greater than 130/80 Continue DASH diet.   Reminder to go to the ER if any CP, SOB, nausea, dizziness, severe HA, changes vision/speech, left arm numbness and tingling and jaw pain.  Cholesterol Currently at goal; continue statin Continue low cholesterol diet and exercise.  Check lipid panel.   Prediabetes Continue diet and exercise.  Perform daily foot/skin check, notify office of any concerning changes.  Check A1C q93m monitor CMP and weight  BMI 25 Continue to recommend diet heavy in fruits and veggies and low in animal meats, cheeses, and dairy products, appropriate calorie intake Discuss exercise recommendations routinely Continue to monitor weight at each visit  Vitamin D Def At goal at last visit; continue supplementation to maintain goal of 60-100 Defer Vit D level  Gout Continue allopurinol Diet discussed Check uric acid as needed  GERD Well managed on current medications; monitor magnesium, etc for PPI Discussed diet, avoiding triggers and other lifestyle changes  Aches/pains Suggested tylenol, tumeric+bioprene, topical voltaren  Bradycardia/lack of energy Will have hold ziac; check BP, pulse, monitor energy Follow up in 10-14 days   Continue diet and meds as discussed. Further disposition pending results of labs. Discussed med's effects and SE's.   Over 30 minutes of exam, counseling, chart review, and critical decision making was performed.   Future Appointments  Date Time Provider [place] 03/11/2020  9:30 AM CLiane Comber NP [facility] None  09/02/2020  9:00 AM MUnk Pinto MD [facility] None    ----------------------------------------------------------------------------------------------------------------------  HPI 79y.o. male  presents for 3 month follow up on hypertension,  cholesterol, prediabetes, weight and vitamin D deficiency. He also has hyperuricemia, GERD, BPH with LUTS controlled on respective medications. He is followed by urology.   he has a diagnosis of GERD which is currently managed by omeprazole 20 mg daily (in evening) He had normal EGD in 2019 by Dr. GCarlean Purlhe reports symptoms is currently well controlled, and denies breakthrough reflux, burning in chest, hoarseness or cough except at night if he lays flat - he has adjustable bed and raises the head  BMI is Body mass index is 25.04 kg/m., he has been working on diet and exercise - he is very active in his yard, mowing, gardening, etc.  Wt Readings from Last 3 Encounters:  11/29/19 203 lb (92.1 kg)  08/28/19 204 lb 6.4 oz (92.7 kg)  05/23/19 208 lb 9.6 oz (94.6 kg)   His blood pressure has been controlled at home (105-120s/60s), today their BP is BP: 114/62  He does workout. He denies chest pain, shortness of breath, dizziness. Does reports less get up in go in the last year or two, pulses notably staying in 40s at home. Last EKG showed brady without heart block.    He is on cholesterol medication (atorvastatin 10 mg daily) and denies myalgias. His cholesterol is at goal. The cholesterol last visit was:   Lab Results  Component Value Date   CHOL 169 08/28/2019   HDL 54 08/28/2019   LDLCALC 99 08/28/2019   TRIG 75 08/28/2019   CHOLHDL 3.1 08/28/2019    He has been working on diet and exercise for hx of prediabetes, and denies foot ulcerations, increased appetite, nausea, paresthesia of the feet, polydipsia, polyuria, visual disturbances, vomiting and weight loss. Last A1C in the office was:  Lab Results  Component Value Date  HGBA1C 5.6 08/28/2019   Patient is on Vitamin D supplement.   Lab Results  Component Value Date   VD25OH 75 08/28/2019     Patient is on allopurinol for hyperurecemia and does not report a recent flare.  Lab Results  Component Value Date   LABURIC 6.4  08/28/2019      Current Medications:  Current Outpatient Medications on File Prior to Visit  Medication Sig  . Alfalfa 650 MG TABS Take 2 tablets by mouth daily.  Marland Kitchen allopurinol (ZYLOPRIM) 300 MG tablet Take 1 tablet Daily for Gout  . aspirin 81 MG tablet Take 81 mg by mouth daily.  Marland Kitchen atorvastatin (LIPITOR) 10 MG tablet Take 1 tablet Daily for Cholesterol (Patient taking differently: Take 1/2 tablet Daily for Cholesterol)  . bisoprolol-hydrochlorothiazide (ZIAC) 10-6.25 MG tablet Take 1 tablet Daily for BP (Patient taking differently: Take 1/2 tablet Daily for BP)  . blood glucose meter kit and supplies Dispense based insurance preference. E11.9  . Cholecalciferol (VITAMIN D3) 2000 units TABS Take 3 tablets by mouth daily.  . Cinnamon 500 MG TABS Take 2 tablets by mouth daily.  Marland Kitchen doxazosin (CARDURA) 8 MG tablet Take 1 tablet at Bedtime for BP & Prostate (Patient taking differently: Take 1/2 tablet at Bedtime for BP & Prostate)  . enalapril (VASOTEC) 20 MG tablet TAKE 1 TABLET BY MOUTH TWICE DAILY FOR BLOOD PRESSURE  . finasteride (PROSCAR) 5 MG tablet Take 5 mg by mouth every evening.   . Garlic 8882 MG CAPS Take 1,000 mg by mouth daily.  Marland Kitchen glucose blood (ONE TOUCH ULTRA TEST) test strip USE TO CHECK BLOOD SUGAR ONCE DAILY  . glucose blood (ONETOUCH ULTRA) test strip Check  Blood Sugar Daily (Dx: E11.9)  . glucose blood test strip Check blood sugar once daily  . latanoprost (XALATAN) 0.005 % ophthalmic solution Place 1 drop into both eyes daily.  . Magnesium 250 MG TABS Take 250 mg by mouth 2 (two) times daily. Breakfast and lunch  . minoxidil (LONITEN) 2.5 MG tablet TAKE 1-2 TABLETS BY MOUTH EVERY MORNING FOR BLOOD PRESSURE  . Multiple Vitamin (MULTIVITAMIN) tablet Take 1 tablet by mouth daily.  . Omega-3 Fatty Acids (OMEGA-3 FISH OIL) 1200 MG CAPS Take 1 capsule by mouth daily with lunch.  Marland Kitchen omeprazole (PRILOSEC) 20 MG capsule Take 1 capsule Daily for Heartburn & Indigestion  . OneTouch  Delica Lancets 80K MISC Check blood sugar 1 time a day.  . loratadine (CLARITIN) 10 MG tablet Take 10 mg by mouth daily as needed.   No current facility-administered medications on file prior to visit.     Allergies:  Allergies  Allergen Reactions  . Adhesive [Tape] Other (See Comments)    reddness     Medical History:  Past Medical History:  Diagnosis Date  . Benign localized prostatic hyperplasia with lower urinary tract symptoms (LUTS)   . Bruises easily   . Elevated PSA   . GERD (gastroesophageal reflux disease)   . History of gout   . History of kidney stones   . Hyperlipidemia   . Hypertension   . Nephrolithiasis   . Nocturia   . Pre-diabetes   . Vitamin D deficiency   . Wears dentures    full upper and lower partial    Family history- Reviewed and unchanged Social history- Reviewed and unchanged   Review of Systems:  Review of Systems  Constitutional: Negative for malaise/fatigue and weight loss.  HENT: Negative for hearing loss and tinnitus.  Eyes: Negative for blurred vision and double vision.  Respiratory: Negative for cough, shortness of breath and wheezing.   Cardiovascular: Negative for chest pain, palpitations, orthopnea, claudication and leg swelling.  Gastrointestinal: Positive for heartburn (intermittently at night; improved with raising head of bed). Negative for abdominal pain, blood in stool, constipation, diarrhea, melena, nausea and vomiting.  Genitourinary: Negative.   Musculoskeletal: Negative for joint pain and myalgias.  Skin: Negative for rash.  Neurological: Negative for dizziness, tingling, sensory change, weakness and headaches.  Endo/Heme/Allergies: Negative for polydipsia.  Psychiatric/Behavioral: Negative.   All other systems reviewed and are negative.   Physical Exam:  BP 114/62   Pulse (!) 42   Temp 97.7 F (36.5 C)   Wt 203 lb (92.1 kg)   SpO2 96%   BMI 25.04 kg/m  Wt Readings from Last 3 Encounters:  11/29/19 203 lb  (92.1 kg)  08/28/19 204 lb 6.4 oz (92.7 kg)  05/23/19 208 lb 9.6 oz (94.6 kg)   General Appearance: Well nourished, in no apparent distress. Eyes: PERRLA, EOMs, conjunctiva no swelling or erythema Sinuses: No Frontal/maxillary tenderness ENT/Mouth: Ext aud canals clear, TMs without erythema, bulging. No erythema, swelling, or exudate on post pharynx.  Tonsils not swollen or erythematous. Hearing normal.  Neck: Supple, thyroid normal.  Respiratory: Respiratory effort normal, BS equal bilaterally without rales, rhonchi, wheezing or stridor.  Cardio: Normal S1/2, bradycardic with no MRGs. Brisk peripheral pulses without edema.  Abdomen: Soft, + BS.  Non tender, no guarding, rebound, hernias, masses. Lymphatics: Non tender without lymphadenopathy.  Musculoskeletal: Full ROM, 5/5 strength, Normal gait Skin: Warm, dry without rashes, lesions, ecchymosis.  Neuro: Cranial nerves intact. No cerebellar symptoms.  Psych: Awake and oriented X 3, normal affect, Insight and Judgment appropriate.    Izora Ribas, NP 9:00 AM [facility]

## 2019-11-29 ENCOUNTER — Other Ambulatory Visit: Payer: Self-pay

## 2019-11-29 ENCOUNTER — Encounter: Payer: Self-pay | Admitting: Adult Health

## 2019-11-29 ENCOUNTER — Ambulatory Visit (INDEPENDENT_AMBULATORY_CARE_PROVIDER_SITE_OTHER): Payer: PPO | Admitting: Adult Health

## 2019-11-29 VITALS — BP 114/62 | HR 42 | Temp 97.7°F | Wt 203.0 lb

## 2019-11-29 DIAGNOSIS — I10 Essential (primary) hypertension: Secondary | ICD-10-CM | POA: Diagnosis not present

## 2019-11-29 DIAGNOSIS — E559 Vitamin D deficiency, unspecified: Secondary | ICD-10-CM | POA: Diagnosis not present

## 2019-11-29 DIAGNOSIS — R001 Bradycardia, unspecified: Secondary | ICD-10-CM

## 2019-11-29 DIAGNOSIS — Z6825 Body mass index (BMI) 25.0-25.9, adult: Secondary | ICD-10-CM | POA: Diagnosis not present

## 2019-11-29 DIAGNOSIS — R7303 Prediabetes: Secondary | ICD-10-CM

## 2019-11-29 DIAGNOSIS — M1000 Idiopathic gout, unspecified site: Secondary | ICD-10-CM | POA: Diagnosis not present

## 2019-11-29 DIAGNOSIS — K219 Gastro-esophageal reflux disease without esophagitis: Secondary | ICD-10-CM | POA: Diagnosis not present

## 2019-11-29 DIAGNOSIS — E782 Mixed hyperlipidemia: Secondary | ICD-10-CM

## 2019-11-29 DIAGNOSIS — R5383 Other fatigue: Secondary | ICD-10-CM | POA: Diagnosis not present

## 2019-11-29 NOTE — Patient Instructions (Addendum)
Goals    . Blood Pressure < 130/80    . Weight (lb) < 200 lb (90.7 kg)       Ideally want to see your pule improve to above 55 or 60  Please try holding bisoprolol/hydrochlorothiazide blood pressure medication for 1-2 weeks, check BP and pulse daily and please send me a log with the numbers and if you are having more energy    Safe options for aches/pains Can try tumeric + black pepper fruit or bioprene - natural antiinflammatory to take daily which may help aches and pains  Can also try voltaren/ diclofenac gel - over the counter at pharmacy     Bradycardia, Adult Bradycardia is a slower-than-normal heartbeat. A normal resting heart rate for an adult ranges from 60 to 100 beats per minute. With bradycardia, the resting heart rate is less than 60 beats per minute. Bradycardia can prevent enough oxygen from reaching certain areas of your body when you are active. It can be serious if it keeps enough oxygen from reaching your brain and other parts of your body. Bradycardia is not a problem for everyone. For some healthy adults, a slow resting heart rate is normal. What are the causes? This condition may be caused by:  A problem with the heart, including: ? A problem with the heart's electrical system, such as a heart block. With a heart block, electrical signals between the chambers of the heart are partially or completely blocked, so they are not able to work as they should. ? A problem with the heart's natural pacemaker (sinus node). ? Heart disease. ? A heart attack. ? Heart damage. ? Lyme disease. ? A heart infection. ? A heart condition that is present at birth (congenital heart defect).  Certain medicines that treat heart conditions.  Certain conditions, such as hypothyroidism and obstructive sleep apnea.  Problems with the balance of chemicals and other substances, like potassium, in the blood.  Trauma.  Radiation therapy. What increases the risk? You are more likely  to develop this condition if you:  Are age 30 or older.  Have high blood pressure (hypertension), high cholesterol (hyperlipidemia), or diabetes.  Drink heavily, use tobacco or nicotine products, or use drugs. What are the signs or symptoms? Symptoms of this condition include:  Light-headedness.  Feeling faint or fainting.  Fatigue and weakness.  Trouble with activity or exercise.  Shortness of breath.  Chest pain (angina).  Drowsiness.  Confusion.  Dizziness. How is this diagnosed? This condition may be diagnosed based on:  Your symptoms.  Your medical history.  A physical exam. During the exam, your health care provider will listen to your heartbeat and check your pulse. To confirm the diagnosis, your health care provider may order tests, such as:  Blood tests.  An electrocardiogram (ECG). This test records the heart's electrical activity. The test can show how fast your heart is beating and whether the heartbeat is steady.  A test in which you wear a portable device (event recorder or Holter monitor) to record your heart's electrical activity while you go about your day.  Anexercise test. How is this treated? Treatment for this condition depends on the cause of the condition and how severe your symptoms are. Treatment may involve:  Treatment of the underlying condition.  Changing your medicines or how much medicine you take.  Having a small, battery-operated device called a pacemaker implanted under the skin. When bradycardia occurs, this device can be used to increase your heart rate and  help your heart beat in a regular rhythm. Follow these instructions at home: Lifestyle   Manage any health conditions that contribute to bradycardia as told by your health care provider.  Follow a heart-healthy diet. A nutrition specialist (dietitian) can help educate you about healthy food options and changes.  Follow an exercise program that is approved by your  health care provider.  Maintain a healthy weight.  Try to reduce or manage your stress, such as with yoga or meditation. If you need help reducing stress, ask your health care provider.  Do not use any products that contain nicotine or tobacco, such as cigarettes, e-cigarettes, and chewing tobacco. If you need help quitting, ask your health care provider.  Do not use illegal drugs.  Limit alcohol intake to no more than 1 drink a day for nonpregnant women and 2 drinks a day for men. Be aware of how much alcohol is in your drink. In the [place] one drink equals one 12 oz bottle of beer (355 mL), one 5 oz glass of wine (148 mL), or one 1 oz glass of hard liquor (44 mL). General instructions  Take over-the-counter and prescription medicines only as told by your health care provider.  Keep all follow-up visits as told by your health care provider. This is important. How is this prevented? In some cases, bradycardia may be prevented by:  Treating underlying medical problems.  Stopping behaviors or medicines that can trigger the condition. Contact a health care provider if you:  Feel light-headed or dizzy.  Almost faint.  Feel weak or are easily fatigued during physical activity.  Experience confusion or have memory problems. Get help right away if:  You faint.  You have: ? An irregular heartbeat (palpitations). ? Chest pain. ? Trouble breathing. Summary  Bradycardia is a slower-than-normal heartbeat. With bradycardia, the resting heart rate is less than 60 beats per minute.  Treatment for this condition depends on the cause.  Manage any health conditions that contribute to bradycardia as told by your health care provider.  Do not use any products that contain nicotine or tobacco, such as cigarettes, e-cigarettes, and chewing tobacco, and limit alcohol intake.  Keep all follow-up visits as told by your health care provider. This is important. This information is not intended  to replace advice given to you by your health care provider. Make sure you discuss any questions you have with your health care provider. Document Revised: 11/20/2017 Document Reviewed: 10/18/2017 Elsevier Patient Education  [place].

## 2019-11-30 LAB — COMPLETE METABOLIC PANEL WITH GFR
AG Ratio: 2 (calc) (ref 1.0–2.5)
ALT: 22 U/L (ref 9–46)
AST: 27 U/L (ref 10–35)
Albumin: 4.5 g/dL (ref 3.6–5.1)
Alkaline phosphatase (APISO): 66 U/L (ref 35–144)
BUN: 14 mg/dL (ref 7–25)
CO2: 28 mmol/L (ref 20–32)
Calcium: 9.9 mg/dL (ref 8.6–10.3)
Chloride: 103 mmol/L (ref 98–110)
Creat: 0.78 mg/dL (ref 0.70–1.18)
GFR, Est African American: 100 mL/min/{1.73_m2} (ref >=60)
GFR, Est Non African American: 86 mL/min/{1.73_m2} (ref >=60)
Globulin: 2.2 g/dL (calc) (ref 1.9–3.7)
Glucose, Bld: 101 mg/dL — ABNORMAL HIGH (ref 65–99)
Potassium: 4.5 mmol/L (ref 3.5–5.3)
Sodium: 138 mmol/L (ref 135–146)
Total Bilirubin: 0.7 mg/dL (ref 0.2–1.2)
Total Protein: 6.7 g/dL (ref 6.1–8.1)

## 2019-11-30 LAB — CBC WITH DIFFERENTIAL/PLATELET
Absolute Monocytes: 517 cells/uL (ref 200–950)
Basophils Absolute: 38 cells/uL (ref 0–200)
Basophils Relative: 0.6 %
Eosinophils Absolute: 101 cells/uL (ref 15–500)
Eosinophils Relative: 1.6 %
HCT: 41.2 % (ref 38.5–50.0)
Hemoglobin: 13.9 g/dL (ref 13.2–17.1)
Lymphs Abs: 1607 cells/uL (ref 850–3900)
MCH: 32.1 pg (ref 27.0–33.0)
MCHC: 33.7 g/dL (ref 32.0–36.0)
MCV: 95.2 fL (ref 80.0–100.0)
MPV: 10.5 fL (ref 7.5–12.5)
Monocytes Relative: 8.2 %
Neutro Abs: 4038 cells/uL (ref 1500–7800)
Neutrophils Relative %: 64.1 %
Platelets: 200 10*3/uL (ref 140–400)
RBC: 4.33 10*6/uL (ref 4.20–5.80)
RDW: 12.3 % (ref 11.0–15.0)
Total Lymphocyte: 25.5 %
WBC: 6.3 10*3/uL (ref 3.8–10.8)

## 2019-11-30 LAB — LIPID PANEL
Cholesterol: 183 mg/dL (ref <200)
HDL: 52 mg/dL (ref >=40)
LDL Cholesterol (Calc): 114 mg/dL (calc) — ABNORMAL HIGH
Non-HDL Cholesterol (Calc): 131 mg/dL (calc) — ABNORMAL HIGH (ref <130)
Total CHOL/HDL Ratio: 3.5 (calc) (ref <5.0)
Triglycerides: 78 mg/dL (ref <150)

## 2019-11-30 LAB — MAGNESIUM: Magnesium: 1.9 mg/dL (ref 1.5–2.5)

## 2019-11-30 LAB — TSH: TSH: 1.33 mIU/L (ref 0.40–4.50)

## 2019-12-03 ENCOUNTER — Other Ambulatory Visit: Payer: Self-pay | Admitting: Internal Medicine

## 2019-12-03 DIAGNOSIS — I10 Essential (primary) hypertension: Secondary | ICD-10-CM

## 2019-12-03 MED ORDER — BISOPROLOL-HYDROCHLOROTHIAZIDE 2.5-6.25 MG PO TABS: ORAL_TABLET | ORAL | 1 refills | Status: DC

## 2019-12-04 ENCOUNTER — Telehealth: Payer: Self-pay

## 2019-12-04 NOTE — Telephone Encounter (Signed)
Patient dropped off BP readings for Dr. Melford Aase and Caryl Pina to review. Patient was recently prescribed Bisoprolol-HCTZ, 2.5-6.25mg  dose. Patient advised per provider to to take it for at least once week and to check his BP after he gets up in the morning. Patient will call in a week to let us know how he is doing.

## 2020-01-18 ENCOUNTER — Other Ambulatory Visit: Payer: Self-pay | Admitting: Internal Medicine

## 2020-01-18 DIAGNOSIS — I10 Essential (primary) hypertension: Secondary | ICD-10-CM

## 2020-01-20 ENCOUNTER — Other Ambulatory Visit: Payer: Self-pay | Admitting: Adult Health

## 2020-01-20 DIAGNOSIS — I10 Essential (primary) hypertension: Secondary | ICD-10-CM

## 2020-01-21 ENCOUNTER — Other Ambulatory Visit: Payer: Self-pay | Admitting: Internal Medicine

## 2020-01-21 DIAGNOSIS — I10 Essential (primary) hypertension: Secondary | ICD-10-CM

## 2020-02-26 DIAGNOSIS — N200 Calculus of kidney: Secondary | ICD-10-CM | POA: Diagnosis not present

## 2020-02-26 DIAGNOSIS — N401 Enlarged prostate with lower urinary tract symptoms: Secondary | ICD-10-CM | POA: Diagnosis not present

## 2020-02-26 DIAGNOSIS — R351 Nocturia: Secondary | ICD-10-CM | POA: Diagnosis not present

## 2020-02-26 DIAGNOSIS — R102 Pelvic and perineal pain: Secondary | ICD-10-CM | POA: Diagnosis not present

## 2020-02-27 ENCOUNTER — Encounter: Payer: Self-pay | Admitting: Internal Medicine

## 2020-03-11 ENCOUNTER — Ambulatory Visit (INDEPENDENT_AMBULATORY_CARE_PROVIDER_SITE_OTHER): Payer: PPO | Admitting: Adult Health Nurse Practitioner

## 2020-03-11 ENCOUNTER — Encounter: Payer: Self-pay | Admitting: Adult Health Nurse Practitioner

## 2020-03-11 ENCOUNTER — Other Ambulatory Visit: Payer: Self-pay

## 2020-03-11 VITALS — BP 118/66 | HR 58 | Temp 97.6°F | Ht 75.5 in | Wt 202.6 lb

## 2020-03-11 DIAGNOSIS — M1000 Idiopathic gout, unspecified site: Secondary | ICD-10-CM | POA: Diagnosis not present

## 2020-03-11 DIAGNOSIS — N401 Enlarged prostate with lower urinary tract symptoms: Secondary | ICD-10-CM

## 2020-03-11 DIAGNOSIS — J301 Allergic rhinitis due to pollen: Secondary | ICD-10-CM | POA: Diagnosis not present

## 2020-03-11 DIAGNOSIS — Z6824 Body mass index (BMI) 24.0-24.9, adult: Secondary | ICD-10-CM

## 2020-03-11 DIAGNOSIS — R001 Bradycardia, unspecified: Secondary | ICD-10-CM | POA: Diagnosis not present

## 2020-03-11 DIAGNOSIS — K219 Gastro-esophageal reflux disease without esophagitis: Secondary | ICD-10-CM

## 2020-03-11 DIAGNOSIS — I10 Essential (primary) hypertension: Secondary | ICD-10-CM

## 2020-03-11 DIAGNOSIS — R7309 Other abnormal glucose: Secondary | ICD-10-CM

## 2020-03-11 DIAGNOSIS — E782 Mixed hyperlipidemia: Secondary | ICD-10-CM

## 2020-03-11 DIAGNOSIS — Z0001 Encounter for general adult medical examination with abnormal findings: Secondary | ICD-10-CM | POA: Diagnosis not present

## 2020-03-11 DIAGNOSIS — Z79899 Other long term (current) drug therapy: Secondary | ICD-10-CM

## 2020-03-11 DIAGNOSIS — Z0000 Encounter for general adult medical examination without abnormal findings: Secondary | ICD-10-CM

## 2020-03-11 DIAGNOSIS — Z6825 Body mass index (BMI) 25.0-25.9, adult: Secondary | ICD-10-CM

## 2020-03-11 DIAGNOSIS — E559 Vitamin D deficiency, unspecified: Secondary | ICD-10-CM | POA: Diagnosis not present

## 2020-03-11 DIAGNOSIS — R6889 Other general symptoms and signs: Secondary | ICD-10-CM

## 2020-03-11 DIAGNOSIS — H9071 Mixed conductive and sensorineural hearing loss, unilateral, right ear, with unrestricted hearing on the contralateral side: Secondary | ICD-10-CM | POA: Diagnosis not present

## 2020-03-11 NOTE — Patient Instructions (Addendum)
   Continue to check your blood pressure twice a day.  We are going to change the way you take your blood pressure medications.   Take Enalapri (Vasotec) 20mg , half tablet in the morning.  Continue to take whole tablet in the evening.  IF your pulse continues to be 50 or less and feeling tired change your evening dose of Enalapril (Vasotec) to half tablet.   Continue taking half tablet of the bisoprolol / HCTZ 2.5mg -6.25mg  daily.   We will contact you via MyChart with your lab results.  Please contact the office if you have any new or worsening symptoms.

## 2020-03-11 NOTE — Progress Notes (Signed)
MEDICARE ANNUAL WELLNESS VISIT AND FOLLOW UP   Assessment:  : Encounter for Medicare annual wellness exam 1 year  Essential hypertension Continue current medications: Enalapril 10m BID half tab in am and whole in evening, Bisoprolol 2.56m6.245mg, half tablet daily. Minoxidil 2.45m99mne daily. Bradycardia - discussed with patient, medication adjustment today. Monitor blood pressure at home; call if consistently over 130/80 Continue DASH diet.   Reminder to go to the ER if any CP, SOB, nausea, dizziness, severe HA, changes vision/speech, left arm numbness and tingling and jaw pain. -     CBC with Diff -     COMPLETE METABOLIC PANEL WITH GFR  Hyperlipidemia, mixed Continue medications:  Discussed dietary and exercise modifications Low fat diet -     Lipid Profile   Benign prostatic hyperplasia with lower urinary tract symptoms, symptom details unspecified Doing well at this time Continue medications: doxasosin 8mg245mghty, finasteride 45mg 47mning Will continue to monitor Defer PSA this check   Gout, unspecified cause, unspecified chronicity, unspecified site Continue allopurinol 300mg 3my No recent flares Discussed dietary modifications Continue to monitor   Gastroesophageal reflux disease without esophagitis Continue PPI/H2 blocker, diet discussed omeprazole 20mg d545m  Vitamin D deficiency Continue supplementation to maintain goal of 70-100 Taking Vitamin D 6,000 IU daily Defer vitamin D level  Abnormal glucose Discussed dietary and exercise modifications  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 Discussed med's effects and SE's.   Over 30 minutes of face to face interview, exam, counseling, chart review, and critical decision making was performed.    Future Appointments  Date Time Provider [id number]  9:00 AM [place]k Pinto[facility] None  03/11/2021 10:30 AM [place]Garnet SierrasAM[facility] None     Plan:   During the course of the visit the patient was educated and counseled about appropriate screening and preventive services including:    Pneumococcal vaccine   Influenza vaccine  Prevnar 13  Td vaccine  Screening electrocardiogram  Colorectal cancer screening  Diabetes screening  Glaucoma screening  Nutrition counseling    Subjective:  Michael AREL TIPPEN9 y.o.66resents for medicare annual welllnesss and 3 month follow up on hypertension, HLD, pre-diabetes, weight and vitamin D deficiency,GERD and gout controlled by meds.    He was started on gluacoma eye drops from Dr. Miller Sabra Heckeks.  Recently saw Dr Rein  HRayann Hemanlood pressure has been controlled at home, he is on cardura and ziac and enalapril, and mindoxidil 2.45mg, to145m their BP is BP: 118/66  BMI is Body mass index is 24.99 kg/m., he is working on diet and exercise. Wt Readings from Last 3 Encounters:  03/11/20 202 lb 9.6 oz (91.9 kg)  11/29/19 203 lb (92.1 kg)  08/28/19 204 lb 6.4 oz (92.7 kg)   He does not workout. He denies chest pain, shortness of breath, dizziness.  He is on cholesterol medication, lipitor and denies myalgias. His cholesterol is at goal. The cholesterol last visit was:   Lab Results  Component Value Date   CHOL 159 03/11/2020   HDL 53 03/11/2020  LDLCALC 89 03/11/2020   TRIG 81 03/11/2020   CHOLHDL 3.0 03/11/2020   He has not been working on diet and exercise for prediabetes, and denies hyperglycemia, hypoglycemia , nausea, polydipsia, polyuria and vomiting. Last  A1C in the office was:  Lab Results  Component Value Date   HGBA1C 5.6 08/28/2019   Last GFR Lab Results  Component Value Date   GFRNONAA 80 03/11/2020    Patient is on Vitamin D supplement.   Lab Results  Component Value Date   VD25OH 75 08/28/2019      Medication Review:   Current Outpatient Medications (Cardiovascular):  .  atorvastatin (LIPITOR) 10 MG tablet, Take 1 tablet Daily for Cholesterol (Patient taking differently: Take 1/2 tablet Daily for Cholesterol) .  bisoprolol-hydrochlorothiazide (ZIAC) 2.5-6.25 MG tablet, Take 1 tablet Daily for BP .  doxazosin (CARDURA) 8 MG tablet, Take 1 tablet at Bedtime for BP & Prostate (Patient taking differently: Take 1/2 tablet at Bedtime for BP & Prostate) .  enalapril (VASOTEC) 20 MG tablet, TAKE 1 TABLET BY MOUTH TWICE DAILY FOR BLOOD PRESSURE .  minoxidil (LONITEN) 2.5 MG tablet, TAKE 1-2 TABLETS BY MOUTH EVERY MORNING FOR BLOOD PRESSURE   Current Outpatient Medications (Analgesics):  .  allopurinol (ZYLOPRIM) 300 MG tablet, Take 1 tablet Daily for Gout .  aspirin 81 MG tablet, Take 81 mg by mouth daily.   Current Outpatient Medications (Other):  Marland Kitchen  Alfalfa 650 MG TABS, Take 2 tablets by mouth daily. .  blood glucose meter kit and supplies, Dispense based insurance preference. E11.9 .  Cholecalciferol (VITAMIN D3) 2000 units TABS, Take 3 tablets by mouth daily. .  Cinnamon 500 MG TABS, Take 2 tablets by mouth daily. .  finasteride (PROSCAR) 5 MG tablet, Take 5 mg by mouth every evening.  .  Garlic 5993 MG CAPS, Take 1,000 mg by mouth daily. Marland Kitchen  glucose blood (ONE TOUCH ULTRA TEST) test strip, USE TO CHECK BLOOD SUGAR ONCE DAILY .  glucose blood (ONETOUCH ULTRA) test strip, Check  Blood Sugar Daily (Dx: E11.9) .  glucose blood test strip, Check blood sugar once daily .  latanoprost (XALATAN) 0.005 % ophthalmic solution, Place 1 drop into both eyes daily. .  Magnesium 250 MG TABS, Take 250 mg by mouth 2 (two) times daily.  Breakfast and lunch .  Multiple Vitamin (MULTIVITAMIN) tablet, Take 1 tablet by mouth daily. .  Omega-3 Fatty Acids (OMEGA-3 FISH OIL) 1200 MG CAPS, Take 1 capsule by mouth daily with lunch. Marland Kitchen  omeprazole (PRILOSEC) 20 MG capsule, Take 1 capsule Daily for Heartburn & Indigestion .  OneTouch Delica Lancets 57S MISC, Check blood sugar 1 time a day.  Allergies: Allergies  Allergen Reactions  . Adhesive [Tape] Other (See Comments)    reddness    Current Problems (verified) has Essential hypertension; Mixed hyperlipidemia; Prediabetes; Vitamin D deficiency; Benign prostatic hyperplasia; Idiopathic gout; Gastroesophageal reflux disease without esophagitis; and Seasonal allergic rhinitis due to pollen on their problem list.  Screening Tests Immunization History  Administered Date(s) Administered  . DTaP 08/31/2005  . PFIZER SARS-COV-2 Vaccination 06/14/2019, 07/04/2019  . PPD Test 03/17/2014  . Td 07/26/2017    Preventative care: Last colonoscopy: 2019  Prior vaccinations: TD or Tdap: 2019 Influenza: Declined Pneumococcal: Declined Prevnar13: Declined Shingles/Zostavax: Declined  Names of Other Physician/Practitioners you currently use: 1. [facility] here for primary care  Patient Care Team: Unk Pinto, MD as PCP - General (Internal Medicine) [place], Jennefer Bravo, MD as Referring  Physician (Dermatology) Gatha Mayer, MD as Consulting Physician (Gastroenterology) Irine Seal, MD as Attending Physician (Urology) Marica Otter, [place] (Optometry)  Surgical: He  has a past surgical history that includes Prostate biopsy (2000, 12/2004); Extracorporeal shock wave lithotripsy [phone]); Hand ligament reconstruction (Right, 2009); Cystoscopy with retrograde pyelogram, ureteroscopy and stent placement (Bilateral, 04/19/2016); and Colonoscopy (2008). Family His family history includes Breast cancer in his sister; Cancer in his sister  and sister; Colon cancer in his sister; Diabetes in his brother, brother, brother, father, mother, and sister; Heart attack in his father; Heart disease in his brother, father, and mother; Pancreatic cancer in his brother; Stroke in his brother. Social history  He reports that he quit smoking about 54 years ago. His smoking use included cigarettes. He quit after 5.00 years of use. He quit smokeless tobacco use about 53 years ago.  His smokeless tobacco use included chew. He reports that he does not drink alcohol and does not use drugs.  MEDICARE WELLNESS OBJECTIVES: Physical activity: Current Exercise Habits: Home exercise routine, Type of exercise: Other - see comments, Time (Minutes): 30 (Yard work), Frequency (Times/Week): 3, Weekly Exercise (Minutes/Week): 90, Intensity: Mild, Exercise limited by: None identified Cardiac risk factors: Cardiac Risk Factors include: advanced age (>76mn, >>84women);dyslipidemia;hypertension;male gender;sedentary lifestyle Depression/mood screen:   [facility] 03/11/2020  Decreased Interest 0  Down, Depressed, Hopeless 0  PHQ - 2 Score 0  Altered sleeping -  Tired, decreased energy -  Change in appetite -  Feeling bad or failure about yourself  -  Trouble concentrating -  Moving slowly or fidgety/restless -  Suicidal thoughts -  PHQ-9 Score -  Difficult doing work/chores -    ADLs:  In your present state of health, do you have any difficulty performing the following activities: 03/11/2020 08/27/2019  Hearing? Y N  Comment Has hearing aids -  Vision? N N  Difficulty concentrating or making decisions? N N  Walking or climbing stairs? N N  Dressing or bathing? N N  Doing errands, shopping? N N  Preparing Food and eating ? N -  Using the Toilet? N -  In the past six months, have you accidently leaked urine? N -  Do you have problems with loss of bowel control? N -  Managing your Medications? N -  Managing your Finances? N -  Housekeeping  or managing your Housekeeping? N -  Some recent data might be hidden     Cognitive Testing  Alert? Yes  Normal Appearance?Yes  Oriented to person? Yes  Place? Yes   Time? Yes  Recall of three objects?  Yes  Can perform simple calculations? Yes  Displays appropriate judgment?Yes  Can read the correct time from a watch face?Yes  EOL planning: Does Patient Have a Medical Advance Directive?: No Would patient like information on creating a medical advance directive?: Yes (MAU/Ambulatory/Procedural Areas - Information given)   Objective:   Today's Vitals   03/11/20 1009  BP: 118/66  Pulse: (!) 58  Temp: 97.6 F (36.4 C)  SpO2: 98%  Weight: 202 lb 9.6 oz (91.9 kg)  Height: 6' 3.5" (1.918 m)  PainSc: 0-No pain   Body mass index is 24.99 kg/m.  General appearance: alert, no distress, WD/WN, male HEENT: normocephalic, sclerae anicteric, TMs pearly, nares patent, no discharge or erythema, pharynx normal Oral cavity: MMM, no lesions Neck: supple, no lymphadenopathy, no thyromegaly, no masses Heart: RRR, normal S1, S2, no murmurs Lungs: CTA bilaterally, no wheezes, rhonchi, or rales  Abdomen: +bs, soft, non tender, non distended, no masses, no hepatomegaly, no splenomegaly Musculoskeletal: nontender, no swelling, no obvious deformity Extremities: no edema, no cyanosis, no clubbing Pulses: 2+ symmetric, upper and lower extremities, normal cap refill Neurological: alert, oriented x 3, CN2-12 intact, strength normal upper extremities and lower extremities, sensation normal throughout, DTRs 2+ throughout, no cerebellar signs, gait normal Psychiatric: normal affect, behavior normal, pleasant   Medicare Attestation I have personally reviewed: The patient's medical and social history Their use of alcohol, tobacco or illicit drugs Their current medications and supplements The patient's functional ability including ADLs,fall risks, home safety risks, cognitive, and hearing and visual  impairment Diet and physical activities Evidence for depression or mood disorders  The patient's weight, height, BMI, and visual acuity have been recorded in the chart.  I have made referrals, counseling, and provided education to the patient based on review of the above and I have provided the patient with a written personalized care plan for preventive services.      Garnet Sierras, Laqueta Jean, DNP [facility] 03/21/2020  11:39 AM

## 2020-03-12 LAB — CBC WITH DIFFERENTIAL/PLATELET
Absolute Monocytes: 506 cells/uL (ref 200–950)
Basophils Absolute: 43 cells/uL (ref 0–200)
Basophils Relative: 0.7 %
Eosinophils Absolute: 67 cells/uL (ref 15–500)
Eosinophils Relative: 1.1 %
HCT: 40.3 % (ref 38.5–50.0)
Hemoglobin: 13.7 g/dL (ref 13.2–17.1)
Lymphs Abs: 1586 cells/uL (ref 850–3900)
MCH: 32.3 pg (ref 27.0–33.0)
MCHC: 34 g/dL (ref 32.0–36.0)
MCV: 95 fL (ref 80.0–100.0)
MPV: 10.4 fL (ref 7.5–12.5)
Monocytes Relative: 8.3 %
Neutro Abs: 3898 cells/uL (ref 1500–7800)
Neutrophils Relative %: 63.9 %
Platelets: 199 10*3/uL (ref 140–400)
RBC: 4.24 10*6/uL (ref 4.20–5.80)
RDW: 13 % (ref 11.0–15.0)
Total Lymphocyte: 26 %
WBC: 6.1 10*3/uL (ref 3.8–10.8)

## 2020-03-12 LAB — LIPID PANEL
Cholesterol: 159 mg/dL (ref <200)
HDL: 53 mg/dL (ref >=40)
LDL Cholesterol (Calc): 89 mg/dL (calc)
Non-HDL Cholesterol (Calc): 106 mg/dL (calc) (ref <130)
Total CHOL/HDL Ratio: 3 (calc) (ref <5.0)
Triglycerides: 81 mg/dL (ref <150)

## 2020-03-12 LAB — COMPLETE METABOLIC PANEL WITH GFR
AG Ratio: 2.2 (calc) (ref 1.0–2.5)
ALT: 24 U/L (ref 9–46)
AST: 25 U/L (ref 10–35)
Albumin: 4.6 g/dL (ref 3.6–5.1)
Alkaline phosphatase (APISO): 66 U/L (ref 35–144)
BUN: 17 mg/dL (ref 7–25)
CO2: 31 mmol/L (ref 20–32)
Calcium: 9.8 mg/dL (ref 8.6–10.3)
Chloride: 101 mmol/L (ref 98–110)
Creat: 0.91 mg/dL (ref 0.70–1.18)
GFR, Est African American: 93 mL/min/{1.73_m2} (ref >=60)
GFR, Est Non African American: 80 mL/min/{1.73_m2} (ref >=60)
Globulin: 2.1 g/dL (calc) (ref 1.9–3.7)
Glucose, Bld: 105 mg/dL — ABNORMAL HIGH (ref 65–99)
Potassium: 4.6 mmol/L (ref 3.5–5.3)
Sodium: 140 mmol/L (ref 135–146)
Total Bilirubin: 0.8 mg/dL (ref 0.2–1.2)
Total Protein: 6.7 g/dL (ref 6.1–8.1)

## 2020-03-19 DIAGNOSIS — L814 Other melanin hyperpigmentation: Secondary | ICD-10-CM | POA: Diagnosis not present

## 2020-03-19 DIAGNOSIS — L570 Actinic keratosis: Secondary | ICD-10-CM | POA: Diagnosis not present

## 2020-03-19 DIAGNOSIS — L918 Other hypertrophic disorders of the skin: Secondary | ICD-10-CM | POA: Diagnosis not present

## 2020-03-19 DIAGNOSIS — L578 Other skin changes due to chronic exposure to nonionizing radiation: Secondary | ICD-10-CM | POA: Diagnosis not present

## 2020-03-19 DIAGNOSIS — D225 Melanocytic nevi of trunk: Secondary | ICD-10-CM | POA: Diagnosis not present

## 2020-03-19 DIAGNOSIS — L821 Other seborrheic keratosis: Secondary | ICD-10-CM | POA: Diagnosis not present

## 2020-03-25 DIAGNOSIS — H52223 Regular astigmatism, bilateral: Secondary | ICD-10-CM | POA: Diagnosis not present

## 2020-03-25 DIAGNOSIS — H5213 Myopia, bilateral: Secondary | ICD-10-CM | POA: Diagnosis not present

## 2020-03-25 DIAGNOSIS — H401131 Primary open-angle glaucoma, bilateral, mild stage: Secondary | ICD-10-CM | POA: Diagnosis not present

## 2020-03-25 DIAGNOSIS — H524 Presbyopia: Secondary | ICD-10-CM | POA: Diagnosis not present

## 2020-04-01 DIAGNOSIS — L570 Actinic keratosis: Secondary | ICD-10-CM | POA: Diagnosis not present

## 2020-04-20 ENCOUNTER — Other Ambulatory Visit: Payer: Self-pay | Admitting: Adult Health Nurse Practitioner

## 2020-04-20 DIAGNOSIS — I10 Essential (primary) hypertension: Secondary | ICD-10-CM

## 2020-05-25 ENCOUNTER — Other Ambulatory Visit: Payer: Self-pay | Admitting: Internal Medicine

## 2020-05-25 DIAGNOSIS — I10 Essential (primary) hypertension: Secondary | ICD-10-CM

## 2020-06-10 ENCOUNTER — Other Ambulatory Visit: Payer: Self-pay | Admitting: Internal Medicine

## 2020-06-10 DIAGNOSIS — K219 Gastro-esophageal reflux disease without esophagitis: Secondary | ICD-10-CM

## 2020-06-30 ENCOUNTER — Other Ambulatory Visit: Payer: Self-pay | Admitting: Internal Medicine

## 2020-08-14 DIAGNOSIS — X32XXXA Exposure to sunlight, initial encounter: Secondary | ICD-10-CM | POA: Diagnosis not present

## 2020-08-14 DIAGNOSIS — L570 Actinic keratosis: Secondary | ICD-10-CM | POA: Diagnosis not present

## 2020-08-20 ENCOUNTER — Other Ambulatory Visit: Payer: Self-pay | Admitting: Internal Medicine

## 2020-08-20 DIAGNOSIS — I10 Essential (primary) hypertension: Secondary | ICD-10-CM

## 2020-08-24 ENCOUNTER — Other Ambulatory Visit: Payer: Self-pay | Admitting: Adult Health

## 2020-08-24 ENCOUNTER — Other Ambulatory Visit: Payer: Self-pay | Admitting: Internal Medicine

## 2020-09-01 ENCOUNTER — Encounter: Payer: Self-pay | Admitting: Internal Medicine

## 2020-09-01 NOTE — Patient Instructions (Signed)

## 2020-09-01 NOTE — Progress Notes (Signed)
Annual  Screening/Preventative Visit  & Comprehensive Evaluation & Examination   Future Appointments  Date Time Provider [place]  09/02/2020  9:00 AM Unk Pinto, MD [facility] None  03/11/2021 10:30 AM Garnet Sierras, NP [facility] None  09/03/2021  9:00 AM Unk Pinto, MD [facility] None                                               This very nice 80 y.o. [name] presents for a Screening /Preventative Visit & comprehensive evaluation and management of multiple medical co-morbidities.  Patient has been followed for HTN, HLD, Prediabetes and Vitamin D Deficiency. Patient's Gout is  controlled on Allopurinol as is his GERD on diet & Prilosec. Patient has Obs BPH with LUTS controlled on his Doxazosin/Finastertide.      HTN predates circa 1986. Patient's BP has been controlled at home.  Today's BP is at goal -  128/62. Patient denies any cardiac symptoms as chest pain, palpitations, shortness of breath, dizziness or ankle swelling.      Patient's hyperlipidemia is controlled with diet and Atorvastatin. Patient denies myalgias or other medication SE's. Last lipids were at goal:  Lab Results  Component Value Date   CHOL 159 03/11/2020   HDL 53 03/11/2020   LDLCALC 89 03/11/2020   TRIG 81 03/11/2020   CHOLHDL 3.0 03/11/2020        Patient has hx/o prediabetes (A1c 5.8% /2006) and patient denies reactive hypoglycemic symptoms, visual blurring, diabetic polys or paresthesias. Last A1c was normal & at goal:   Lab Results  Component Value Date   HGBA1C 5.6 08/28/2019         Finally, patient has history of Vitamin D Deficiency ("49"on tx /2008) and last vitamin D was at goal:  Lab Results  Component Value Date   VD25OH 70 08/28/2019    Current Outpatient Medications on File Prior to Visit  Medication Sig  . Alfalfa 650 MG TABS Take 2 tablets  daily.  Marland Kitchen allopurinol  300 MG tablet Take 1 tablet Daily f  . aspirin 81 MG tablet Take  daily.  Marland Kitchen atorvastatin 10  MG tablet TAKE 1 TABLET DAILY   . bisoprolol-hctz 2.5-6.25 MG tablet TAKE 1 TABLET DAILY   . VITAMIN D 2000 units Take 3 tablets daily.  . Cinnamon 500 MG  Take 2 tablets b daily.  . Doxazosin 8 MG tablet Take 1/2 tablet at Bedtime   . enalapril 20 MG tablet Take      1 tablet    2 x /day     for BP  . finasteride  5 MG tablet Take every evening.   . Garlic 9983 MG CAPS Take  daily.  Marland Kitchen lXALATAN 0.005 % ophth soln Place 1 drop into both eyes daily.  . Magnesium 250 MG TABS Take  2  times daily  . minoxidil 2.5 MG tablet Take  1 to 2 tablets  every Morning  for BP  . Multiple Vitamin  Take 1 tablet  daily.  . Omega-3 FISH OIL 1200 MG CAPS Take 1 capsule  daily with lunch.  Marland Kitchen omeprazole 20 MG capsule TAKE 1 CAPSULE  DAILY     Allergies  Allergen Reactions  . Adhesive [Tape] Other (See Comments)    reddness    Past Medical History:  Diagnosis Date  . Benign localized prostatic hyperplasia with  lower urinary tract symptoms (LUTS)   . Bruises easily   . Elevated PSA   . GERD (gastroesophageal reflux disease)   . History of gout   . History of kidney stones   . Hyperlipidemia   . Hypertension   . Nephrolithiasis   . Nocturia   . Pre-diabetes   . Vitamin D deficiency   . Wears dentures    full upper and lower partial     Health Maintenance  Topic Date Due  . Hepatitis C Screening  Never done  . COVID-19 Vaccine (3 - Booster for Pfizer series) 01/01/2020  . TETANUS/TDAP  07/27/2027  . HPV VACCINES  Aged Out  . INFLUENZA VACCINE  Discontinued  . PNA vac Low Risk Adult  Discontinued    Immunization History  Administered Date(s) Administered  . DTaP 08/31/2005  . PFIZER SARS-COV-2 Vacc 06/14/2019, 07/04/2019  . PPD Test 03/17/2014  . Td 07/26/2017    Last Colon - 12/26/2017 - Negative & Dr Carlean Purl- recc no f/u due to age.  Past Surgical History:  Procedure Laterality Date  . COLONOSCOPY  2008  . CYSTOSCOPY WITH RETROGRADE PYELOGRAM, URETEROSCOPY AND STENT PLACEMENT  Bilateral 04/19/2016   Procedure: CYSTOSCOPY WITH BILATERAL RETROGRADE URETEROSCOPY BASKET EXTRACTION;  Surgeon: Irine Seal, MD;  [facility];  Service: Urology;  Laterality: Bilateral;  . EXTRACORPOREAL SHOCK WAVE LITHOTRIPSY  [phone]  . HAND LIGAMENT RECONSTRUCTION Right 2009  . PROSTATE BIOPSY  2000, 12/2004   negative    Family History  Problem Relation Age of Onset  . Heart disease Mother   . Diabetes Mother   . Diabetes Father   . Heart disease Father   . Heart attack Father   . Diabetes Sister   . Colon cancer Sister   . Diabetes Brother   . Pancreatic cancer Brother   . Diabetes Brother   . Diabetes Brother   . Heart disease Brother   . Stroke Brother   . Cancer Sister        breast  . Breast cancer Sister   . Cancer Sister        colon  . Stomach cancer Neg Hx     Social History   Socioeconomic History  . Marital status: Married    Spouse name: Enid Derry  . Number of children: None  Occupational History  . Not on file  Tobacco Use  . Smoking status: Former Smoker    Years: 5.00    Types: Cigarettes    Quit date: 05/23/1965    Years since quitting: 55.3  . Smokeless tobacco: Former Systems developer    Types: [place] date: 04/11/1966  Vaping Use  . Vaping Use: Never used  Substance and Sexual Activity  . Alcohol use: No  . Drug use: No  . 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   1 caffeinated beverage daily.   Former user of smokeless tobacco and former smoker - no current smoking drug use or alcohol     ROS Constitutional: Denies fever, chills, weight loss/gain, headaches, insomnia,  night sweats or change in appetite. Does c/o fatigue. Eyes: Denies redness, blurred vision, diplopia, discharge, itchy or watery eyes.  ENT: Denies discharge, congestion, post nasal drip,  epistaxis, sore throat, earache, hearing loss, dental pain, Tinnitus, Vertigo, Sinus pain or  snoring.  Cardio: Denies chest pain, palpitations, irregular heartbeat, syncope, dyspnea, diaphoresis, orthopnea, PND, claudication or edema Respiratory: denies cough, dyspnea, DOE, pleurisy, hoarseness, laryngitis or wheezing.  Gastrointestinal: Denies dysphagia, heartburn, reflux, water brash, pain, cramps, nausea, vomiting, bloating, diarrhea, constipation, hematemesis, melena, hematochezia, jaundice or hemorrhoids Genitourinary: Denies dysuria, frequency, urgency, nocturia, hesitancy, discharge, hematuria or flank pain Musculoskeletal: Denies arthralgia, myalgia, stiffness, Jt. Swelling, pain, limp or strain/sprain. Denies Falls. Skin: Denies puritis, rash, hives, warts, acne, eczema or change in skin lesion Neuro: No weakness, tremor, incoordination, spasms, paresthesia or pain Psychiatric: Denies confusion, memory loss or sensory loss. Denies Depression. Endocrine: Denies change in weight, skin, hair change, nocturia, and paresthesia, diabetic polys, visual blurring or hyper / hypo glycemic episodes.  Heme/Lymph: No excessive bleeding, bruising or enlarged lymph nodes.  Physical Exam  BP 128/62   Pulse (!) 55   Temp 97.7 F (36.5 C)   Resp 16   Ht 6' 3.2" (1.91 m)   Wt 206 lb 6.4 oz (93.6 kg)   SpO2 97%   BMI 25.66 kg/m   General Appearance: Well nourished and well groomed and in no apparent distress.  Eyes: PERRLA, EOMs, conjunctiva no swelling or erythema, normal fundi and vessels. Sinuses: No frontal/maxillary tenderness ENT/Mouth: EACs patent / TMs  nl. Nares clear without erythema, swelling, mucoid exudates. Oral hygiene is good. No erythema, swelling, or exudate. Tongue normal, non-obstructing. Tonsils not swollen or erythematous. Hearing normal.  Neck: Supple, thyroid not palpable. No bruits, nodes or JVD. Respiratory: Respiratory effort normal.  BS equal and clear bilateral  without rales, rhonci, wheezing or stridor. Cardio: Heart sounds are normal with regular rate and rhythm and no murmurs, rubs or gallops. Peripheral pulses are normal and equal bilaterally without edema. No aortic or femoral bruits. Chest: symmetric with normal excursions and percussion.  Abdomen: Soft, with Nl bowel sounds. Nontender, no guarding, rebound, hernias, masses, or organomegaly.  Lymphatics: Non tender without lymphadenopathy.  Musculoskeletal: Full ROM all peripheral extremities, joint stability, 5/5 strength, and normal gait. Skin: Warm and dry without rashes, lesions, cyanosis, clubbing or  ecchymosis.  Neuro: Cranial nerves intact, reflexes equal bilaterally. Normal muscle tone, no cerebellar symptoms. Sensation intact.  Pysch: Alert and oriented X 3 with normal affect, insight and judgment appropriate.   Assessment and Plan  1. Annual Preventative/Screening Exam   1. Encounter for general adult medical examination with abnormal findings   2. Essential hypertension  - EKG 12-Lead - [place], RETROPERITNL ABD,  LTD - Urinalysis, Routine w reflex microscopic - Microalbumin / creatinine urine ratio - CBC with Differential/Platelet - COMPLETE METABOLIC PANEL WITH GFR - Magnesium - TSH  3. Hyperlipidemia, mixed  - EKG 12-Lead - [place], RETROPERITNL ABD,  LTD - Lipid panel - TSH  4. Abnormal glucose  - EKG 12-Lead - [place], RETROPERITNL ABD,  LTD - Hemoglobin A1c - Insulin, random  5. Vitamin D deficiency  - VITAMIN D 25 Hydroxyl 6. Gout  - Uric acid  7. BPH with obstruction/lower urinary tract symptoms  - PSA  8. Prostate cancer screening  - PSA  9. Screening for colorectal cancer  - POC Hemoccult Bld/Stl   10. Gastroesophageal reflux disease  - CBC with Differential/Platelet  11. Screening for ischemic heart disease  - EKG 12-Lead  12. FHx: heart disease  - EKG 12-Lead - [place], RETROPERITNL ABD,  LTD  13. Former smoker  - EKG 12-Lead - [place],  RETROPERITNL ABD,  LTD  14. Screening for AAA (aortic abdominal aneurysm)  -  [place], RETROPERITNL ABD,  LTD  15. Medication management  - Urinalysis, Routine w reflex microscopic - Microalbumin / creatinine urine ratio - Uric acid - CBC with Differential/Platelet - COMPLETE METABOLIC PANEL WITH GFR - Magnesium - Lipid panel - TSH - Hemoglobin A1c - Insulin, random - VITAMIN D 25 Hydroxyl          Patient was counseled in prudent diet, weight control to achieve/maintain BMI less than 25, BP monitoring, regular exercise and medications as discussed.  Discussed med effects and SE's. Routine screening labs and tests as requested with regular follow-up as recommended. Over 40 minutes of exam, counseling, chart review and high complex critical decision making was performed   Kirtland Bouchard, MD

## 2020-09-02 ENCOUNTER — Ambulatory Visit (INDEPENDENT_AMBULATORY_CARE_PROVIDER_SITE_OTHER): Payer: PPO | Admitting: Internal Medicine

## 2020-09-02 ENCOUNTER — Other Ambulatory Visit: Payer: Self-pay

## 2020-09-02 VITALS — BP 128/62 | HR 55 | Temp 97.7°F | Resp 16 | Ht 75.2 in | Wt 206.4 lb

## 2020-09-02 DIAGNOSIS — Z79899 Other long term (current) drug therapy: Secondary | ICD-10-CM | POA: Diagnosis not present

## 2020-09-02 DIAGNOSIS — Z125 Encounter for screening for malignant neoplasm of prostate: Secondary | ICD-10-CM

## 2020-09-02 DIAGNOSIS — I10 Essential (primary) hypertension: Secondary | ICD-10-CM

## 2020-09-02 DIAGNOSIS — Z0000 Encounter for general adult medical examination without abnormal findings: Secondary | ICD-10-CM | POA: Diagnosis not present

## 2020-09-02 DIAGNOSIS — Z1211 Encounter for screening for malignant neoplasm of colon: Secondary | ICD-10-CM

## 2020-09-02 DIAGNOSIS — R7309 Other abnormal glucose: Secondary | ICD-10-CM | POA: Diagnosis not present

## 2020-09-02 DIAGNOSIS — N138 Other obstructive and reflux uropathy: Secondary | ICD-10-CM

## 2020-09-02 DIAGNOSIS — K219 Gastro-esophageal reflux disease without esophagitis: Secondary | ICD-10-CM | POA: Diagnosis not present

## 2020-09-02 DIAGNOSIS — Z136 Encounter for screening for cardiovascular disorders: Secondary | ICD-10-CM | POA: Diagnosis not present

## 2020-09-02 DIAGNOSIS — E559 Vitamin D deficiency, unspecified: Secondary | ICD-10-CM

## 2020-09-02 DIAGNOSIS — Z0001 Encounter for general adult medical examination with abnormal findings: Secondary | ICD-10-CM

## 2020-09-02 DIAGNOSIS — N401 Enlarged prostate with lower urinary tract symptoms: Secondary | ICD-10-CM | POA: Diagnosis not present

## 2020-09-02 DIAGNOSIS — Z87891 Personal history of nicotine dependence: Secondary | ICD-10-CM | POA: Diagnosis not present

## 2020-09-02 DIAGNOSIS — M109 Gout, unspecified: Secondary | ICD-10-CM | POA: Diagnosis not present

## 2020-09-02 DIAGNOSIS — E782 Mixed hyperlipidemia: Secondary | ICD-10-CM | POA: Diagnosis not present

## 2020-09-02 DIAGNOSIS — Z8249 Family history of ischemic heart disease and other diseases of the circulatory system: Secondary | ICD-10-CM | POA: Diagnosis not present

## 2020-09-02 DIAGNOSIS — Z1212 Encounter for screening for malignant neoplasm of rectum: Secondary | ICD-10-CM

## 2020-09-02 NOTE — Progress Notes (Signed)
Aortascan <3 cm. Within normal limits per Dr. Melford Aase.

## 2020-09-03 LAB — CBC WITH DIFFERENTIAL/PLATELET
Absolute Monocytes: 539 cells/uL (ref 200–950)
Basophils Absolute: 37 cells/uL (ref 0–200)
Basophils Relative: 0.6 %
Eosinophils Absolute: 118 cells/uL (ref 15–500)
Eosinophils Relative: 1.9 %
HCT: 43.7 % (ref 38.5–50.0)
Hemoglobin: 14.4 g/dL (ref 13.2–17.1)
Lymphs Abs: 1581 cells/uL (ref 850–3900)
MCH: 31 pg (ref 27.0–33.0)
MCHC: 33 g/dL (ref 32.0–36.0)
MCV: 94 fL (ref 80.0–100.0)
MPV: 10.4 fL (ref 7.5–12.5)
Monocytes Relative: 8.7 %
Neutro Abs: 3925 cells/uL (ref 1500–7800)
Neutrophils Relative %: 63.3 %
Platelets: 229 10*3/uL (ref 140–400)
RBC: 4.65 10*6/uL (ref 4.20–5.80)
RDW: 12.7 % (ref 11.0–15.0)
Total Lymphocyte: 25.5 %
WBC: 6.2 10*3/uL (ref 3.8–10.8)

## 2020-09-03 LAB — COMPLETE METABOLIC PANEL WITH GFR
AG Ratio: 2 (calc) (ref 1.0–2.5)
ALT: 28 U/L (ref 9–46)
AST: 35 U/L (ref 10–35)
Albumin: 4.8 g/dL (ref 3.6–5.1)
Alkaline phosphatase (APISO): 65 U/L (ref 35–144)
BUN: 21 mg/dL (ref 7–25)
CO2: 29 mmol/L (ref 20–32)
Calcium: 10.3 mg/dL (ref 8.6–10.3)
Chloride: 98 mmol/L (ref 98–110)
Creat: 0.82 mg/dL (ref 0.70–1.18)
GFR, Est African American: 97 mL/min/{1.73_m2} (ref >=60)
GFR, Est Non African American: 84 mL/min/{1.73_m2} (ref >=60)
Globulin: 2.4 g/dL (calc) (ref 1.9–3.7)
Glucose, Bld: 99 mg/dL (ref 65–99)
Potassium: 4.4 mmol/L (ref 3.5–5.3)
Sodium: 138 mmol/L (ref 135–146)
Total Bilirubin: 0.9 mg/dL (ref 0.2–1.2)
Total Protein: 7.2 g/dL (ref 6.1–8.1)

## 2020-09-03 LAB — MICROALBUMIN / CREATININE URINE RATIO
Creatinine, Urine: 30 mg/dL (ref 20–320)
Microalb Creat Ratio: 7 mcg/mg creat (ref <30)
Microalb, Ur: 0.2 mg/dL

## 2020-09-03 LAB — LIPID PANEL
Cholesterol: 178 mg/dL (ref <200)
HDL: 54 mg/dL (ref >=40)
LDL Cholesterol (Calc): 107 mg/dL (calc) — ABNORMAL HIGH
Non-HDL Cholesterol (Calc): 124 mg/dL (calc) (ref <130)
Total CHOL/HDL Ratio: 3.3 (calc) (ref <5.0)
Triglycerides: 84 mg/dL (ref <150)

## 2020-09-03 LAB — URINALYSIS, ROUTINE W REFLEX MICROSCOPIC
Bilirubin Urine: NEGATIVE
Glucose, UA: NEGATIVE
Hgb urine dipstick: NEGATIVE
Ketones, ur: NEGATIVE
Leukocytes,Ua: NEGATIVE
Nitrite: NEGATIVE
Protein, ur: NEGATIVE
Specific Gravity, Urine: 1.008 (ref 1.001–1.03)
pH: 7 (ref 5.0–8.0)

## 2020-09-03 LAB — VITAMIN D 25 HYDROXY (VIT D DEFICIENCY, FRACTURES): Vit D, 25-Hydroxy: 86 ng/mL (ref 30–100)

## 2020-09-03 LAB — URIC ACID: Uric Acid, Serum: 6.2 mg/dL (ref 4.0–8.0)

## 2020-09-03 LAB — HEMOGLOBIN A1C
Hgb A1c MFr Bld: 5.6 % of total Hgb (ref <5.7)
Mean Plasma Glucose: 114 mg/dL
eAG (mmol/L): 6.3 mmol/L

## 2020-09-03 LAB — INSULIN, RANDOM: Insulin: 6.9 u[IU]/mL

## 2020-09-03 LAB — PSA: PSA: 1.11 ng/mL (ref <=4.0)

## 2020-09-03 LAB — MAGNESIUM: Magnesium: 2.1 mg/dL (ref 1.5–2.5)

## 2020-09-03 LAB — TSH: TSH: 1.29 mIU/L (ref 0.40–4.50)

## 2020-09-03 NOTE — Progress Notes (Signed)
============================================================ -   Test results slightly outside the reference range are not unusual. If there is anything important, I will review this with you,  otherwise it is considered normal test values.  If you have further questions,  please do not hesitate to contact me at the office or via My Chart.  ============================================================ ============================================================  -  PSA - Normal & OK  ============================================================ ============================================================  -  Uric Acid / Gout Test - Normal & OK - Please continue Allopurinol   ============================================================ ============================================================  -  Total Chol = 178 - Excellent   - LDL Chol = 107 - Slightly elevated         (Ideal or Goal is less than 100) ============================================================ ============================================================  -  A1c - Normal - No Diabetes - Great ! ============================================================ ============================================================  -  Vitamin D = 86 - Excellent - Please keep dose same  ============================================================ ============================================================  -  All Else - CBC - Kidneys - Electrolytes - Liver - Magnesium & Thyroid    - all  Normal / OK ============================================================ ============================================================  -Keep up the [place] Work !

## 2020-09-28 DIAGNOSIS — M542 Cervicalgia: Secondary | ICD-10-CM | POA: Diagnosis not present

## 2020-09-28 DIAGNOSIS — M4692 Unspecified inflammatory spondylopathy, cervical region: Secondary | ICD-10-CM | POA: Diagnosis not present

## 2020-09-29 DIAGNOSIS — S161XXD Strain of muscle, fascia and tendon at neck level, subsequent encounter: Secondary | ICD-10-CM | POA: Diagnosis not present

## 2020-09-29 DIAGNOSIS — M62838 Other muscle spasm: Secondary | ICD-10-CM | POA: Diagnosis not present

## 2020-10-01 DIAGNOSIS — M62838 Other muscle spasm: Secondary | ICD-10-CM | POA: Diagnosis not present

## 2020-10-01 DIAGNOSIS — S161XXD Strain of muscle, fascia and tendon at neck level, subsequent encounter: Secondary | ICD-10-CM | POA: Diagnosis not present

## 2020-10-07 DIAGNOSIS — M62838 Other muscle spasm: Secondary | ICD-10-CM | POA: Diagnosis not present

## 2020-10-07 DIAGNOSIS — S161XXD Strain of muscle, fascia and tendon at neck level, subsequent encounter: Secondary | ICD-10-CM | POA: Diagnosis not present

## 2020-10-09 DIAGNOSIS — M62838 Other muscle spasm: Secondary | ICD-10-CM | POA: Diagnosis not present

## 2020-10-09 DIAGNOSIS — S161XXD Strain of muscle, fascia and tendon at neck level, subsequent encounter: Secondary | ICD-10-CM | POA: Diagnosis not present

## 2020-10-13 DIAGNOSIS — S161XXD Strain of muscle, fascia and tendon at neck level, subsequent encounter: Secondary | ICD-10-CM | POA: Diagnosis not present

## 2020-10-13 DIAGNOSIS — M62838 Other muscle spasm: Secondary | ICD-10-CM | POA: Diagnosis not present

## 2020-10-16 DIAGNOSIS — S161XXD Strain of muscle, fascia and tendon at neck level, subsequent encounter: Secondary | ICD-10-CM | POA: Diagnosis not present

## 2020-10-16 DIAGNOSIS — M62838 Other muscle spasm: Secondary | ICD-10-CM | POA: Diagnosis not present

## 2020-10-27 ENCOUNTER — Other Ambulatory Visit: Payer: Self-pay

## 2020-10-27 DIAGNOSIS — Z1211 Encounter for screening for malignant neoplasm of colon: Secondary | ICD-10-CM

## 2020-10-27 LAB — POC HEMOCCULT BLD/STL (HOME/3-CARD/SCREEN)
Card #2 Fecal Occult Blod, POC: NEGATIVE
Card #3 Fecal Occult Blood, POC: NEGATIVE
Fecal Occult Blood, POC: NEGATIVE

## 2020-11-02 DIAGNOSIS — Z1212 Encounter for screening for malignant neoplasm of rectum: Secondary | ICD-10-CM | POA: Diagnosis not present

## 2020-11-02 DIAGNOSIS — Z1211 Encounter for screening for malignant neoplasm of colon: Secondary | ICD-10-CM | POA: Diagnosis not present

## 2020-11-24 ENCOUNTER — Other Ambulatory Visit: Payer: Self-pay | Admitting: Internal Medicine

## 2020-11-24 DIAGNOSIS — I10 Essential (primary) hypertension: Secondary | ICD-10-CM

## 2020-12-11 ENCOUNTER — Other Ambulatory Visit: Payer: Self-pay | Admitting: Internal Medicine

## 2021-01-29 DIAGNOSIS — H52223 Regular astigmatism, bilateral: Secondary | ICD-10-CM | POA: Diagnosis not present

## 2021-01-29 DIAGNOSIS — H401131 Primary open-angle glaucoma, bilateral, mild stage: Secondary | ICD-10-CM | POA: Diagnosis not present

## 2021-01-29 DIAGNOSIS — H524 Presbyopia: Secondary | ICD-10-CM | POA: Diagnosis not present

## 2021-02-24 ENCOUNTER — Other Ambulatory Visit: Payer: Self-pay | Admitting: Dermatology

## 2021-02-24 DIAGNOSIS — Z23 Encounter for immunization: Secondary | ICD-10-CM | POA: Diagnosis not present

## 2021-02-24 DIAGNOSIS — L570 Actinic keratosis: Secondary | ICD-10-CM | POA: Diagnosis not present

## 2021-02-24 DIAGNOSIS — L820 Inflamed seborrheic keratosis: Secondary | ICD-10-CM | POA: Diagnosis not present

## 2021-02-24 DIAGNOSIS — D0462 Carcinoma in situ of skin of left upper limb, including shoulder: Secondary | ICD-10-CM | POA: Diagnosis not present

## 2021-02-24 DIAGNOSIS — D485 Neoplasm of uncertain behavior of skin: Secondary | ICD-10-CM | POA: Diagnosis not present

## 2021-02-25 LAB — DERMATOPATHOLOGY REPORT

## 2021-02-25 LAB — DERMPATH, SPECIMEN A

## 2021-03-11 ENCOUNTER — Ambulatory Visit: Payer: PPO | Admitting: Adult Health Nurse Practitioner

## 2021-03-23 DIAGNOSIS — Z20822 Contact with and (suspected) exposure to covid-19: Secondary | ICD-10-CM | POA: Diagnosis not present

## 2021-03-29 DIAGNOSIS — N200 Calculus of kidney: Secondary | ICD-10-CM | POA: Diagnosis not present

## 2021-03-29 DIAGNOSIS — R351 Nocturia: Secondary | ICD-10-CM | POA: Diagnosis not present

## 2021-03-29 DIAGNOSIS — N401 Enlarged prostate with lower urinary tract symptoms: Secondary | ICD-10-CM | POA: Diagnosis not present

## 2021-03-29 NOTE — Progress Notes (Signed)
MEDICARE ANNUAL WELLNESS VISIT AND FOLLOW UP   Assessment:  : Encounter for Medicare annual wellness exam 1 year  Essential hypertension Continue current medications: Enalapril 107m BID , Bisoprolol 2.580m6.287mg daily. Minoxidil 2.87m32mne daily.Cardura 8mg3m2 tab qhs Monitor blood pressure at home; call if consistently over 130/80 Continue DASH diet.   Reminder to go to the ER if any CP, SOB, nausea, dizziness, severe HA, changes vision/speech, left arm numbness and tingling and jaw pain. -     CBC with Diff -     COMPLETE METABOLIC PANEL WITH GFR  Hyperlipidemia, mixed Continue medications:  Discussed dietary and exercise modifications Low fat diet -     Lipid Profile - TSH  Bradycardia Continue to monitor pulse, if consistently running in the 40's or developing dizziness call the office will stop Ziac and add low dose HCTZ Change position slowly  Benign prostatic hyperplasia with lower urinary tract symptoms, symptom details unspecified Doing well at this time Continue medications: doxasosin 8mg 43m tab nighty, finasteride 87mg e93ming Will continue to monitor Defer PSA this check  Gout, unspecified cause, unspecified chronicity, unspecified site Continue allopurinol 300mg d87m No recent flares Discussed dietary modifications Continue to monitor  Gastroesophageal reflux disease without esophagitis Continue PPI/H2 blocker, diet discussed omeprazole 20mg da49m Vitamin D deficiency Continue supplementation to maintain goal of 70-100 Taking Vitamin D 6,000 IU daily Defer vitamin D level  Abnormal glucose Discussed dietary and exercise modifications  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 Discussed med's effects and SE's.   Over 30 minutes of face to face interview, exam, counseling, chart review, and critical decision making was performed.    Future Appointments  Date Time Provider [place] 11:00 AM [place]Ke[place],Unk PintoM-[facility] None  12/01/2021 11:00 AM Corbett,Liane ComberM[facility] None     Plan:   During the course of the visit the patient was educated and counseled about appropriate screening and preventive services including:   Pneumococcal vaccine  Influenza vaccine Prevnar 13 Td vaccine Screening electrocardiogram Colorectal cancer screening Diabetes screening Glaucoma screening Nutrition counseling    Subjective:  Michael Waller y.o. 40esents for medicare annual welllnesss and 3 month follow up on hypertension, HLD, pre-diabetes, weight and vitamin D deficiency,GERD and gout controlled by meds.    He is on Latanoprost drops for glaucoma from Dr. Miller ,Sabra Heckast seen 01/29/21  He does have seasonal allergic rhinitis which Claritin does help but notices fatigue the next day if he takes the medication so has not been taking regularly.  His blood pressure has been controlled at home running 120's/60's, he is on cardura 8 mg 1/2 tab QHS,  ziac 2.5/6.25 QD, enalapril 20 mg BID, and mindoxidil 2.87mg qd, 487may their BP is BP: 140/78 . Pulse has been mostly in the 50's, some low 60 values.  Denies dizziness. BP Readings from Last 3 Encounters:  03/31/21 140/78  09/02/20 128/62  03/11/20 118/66    BMI is Body mass index is 24.67 kg/m., he is working on  diet and exercise. He is down 8 pounds from last visit, states he will lose weight in the summer due to yard work.  Eats regular meals. Wt Readings  from Last 3 Encounters:  03/31/21 198 lb 6.4 oz (90 kg)  09/02/20 206 lb 6.4 oz (93.6 kg)  03/11/20 202 lb 9.6 oz (91.9 kg)   He does not workout. He denies chest pain, shortness of breath, dizziness.  He is on cholesterol medication, lipitor 10 mg  and denies myalgias. His cholesterol is at goal. The cholesterol last visit was:   Lab Results  Component Value Date   CHOL 178 09/02/2020   HDL 54 09/02/2020   LDLCALC 107 (H) 09/02/2020   TRIG 84 09/02/2020   CHOLHDL 3.3 09/02/2020   He has not been working on diet and exercise for prediabetes, and denies hyperglycemia, hypoglycemia , nausea, polydipsia, polyuria and vomiting. He has checked blood sugar irregularly and values are 90-114 at 6 am.  Last A1C in the office was:  Lab Results  Component Value Date   HGBA1C 5.6 09/02/2020     Last GFR Lab Results  Component Value Date   GFRNONAA 84 09/02/2020    Patient is on Vitamin D supplement.   Lab Results  Component Value Date   VD25OH 86 09/02/2020      Medication Review:   Current Outpatient Medications (Cardiovascular):    atorvastatin (LIPITOR) 10 MG tablet, TAKE 1 TABLET DAILY FOR CHOLESTEROL   bisoprolol-hydrochlorothiazide (ZIAC) 2.5-6.25 MG tablet, TAKE 1 TABLET BY MOUTH DAILY FOR BLOOD PRESSURE   doxazosin (CARDURA) 8 MG tablet, Take 1/2 tablet at Bedtime for BP & Prostate   enalapril (VASOTEC) 20 MG tablet, TAKE 1 TABLET BY MOUTH TWICE DAILY FOR BLOOD PRESSURE   minoxidil (LONITEN) 2.5 MG tablet, Take  1 to 2 tablets  every Morning  for BP   Current Outpatient Medications (Analgesics):    allopurinol (ZYLOPRIM) 300 MG tablet, TAKE 1 TABLET BY MOUTH DAILY FOR GOUT   aspirin 81 MG tablet, Take 81 mg by mouth daily.   Current Outpatient Medications (Other):    Alfalfa 650 MG TABS, Take 2 tablets by mouth daily.   blood glucose meter kit and supplies, Dispense based insurance preference. E11.9   Cholecalciferol (VITAMIN D3) 2000 units TABS, Take 3 tablets by mouth  daily.   Cinnamon 500 MG TABS, Take 2 tablets by mouth daily.   finasteride (PROSCAR) 5 MG tablet, Take 5 mg by mouth every evening.    Garlic 8937 MG CAPS, Take 1,000 mg by mouth daily.   glucose blood (ONE TOUCH ULTRA TEST) test strip, USE TO CHECK BLOOD SUGAR ONCE DAILY   glucose blood (ONETOUCH ULTRA) test strip, Check   Blood Sugar   Daily  (Dx: e11.9)   glucose blood test strip, Check blood sugar once daily   latanoprost (XALATAN) 0.005 % ophthalmic solution, Place 1 drop into both eyes daily.   Magnesium 250 MG TABS, Take 250 mg by mouth 2 (two) times daily. Breakfast and lunch   Multiple Vitamin (MULTIVITAMIN) tablet, Take 1 tablet by mouth daily.   Omega-3 Fatty Acids (OMEGA-3 FISH OIL) 1200 MG CAPS, Take 1 capsule by mouth daily with lunch.   omeprazole (PRILOSEC) 20 MG capsule, TAKE 1 CAPSULE BY MOUTH DAILY   OneTouch Delica Lancets 34K MISC, Check blood sugar 1 time a day.  Allergies: Allergies  Allergen Reactions   Adhesive [Tape] Other (See Comments)    reddness    Current Problems (verified) has  Essential hypertension; Mixed hyperlipidemia; Prediabetes; Vitamin D deficiency; Benign prostatic hyperplasia; Idiopathic gout; Gastroesophageal reflux disease without esophagitis; and Seasonal allergic rhinitis due to pollen on their problem list.  Screening Tests Immunization History  Administered Date(s) Administered   DTaP 08/31/2005   PFIZER(Purple Top)SARS-COV-2 Vaccination 06/14/2019, 07/04/2019   PPD Test 03/17/2014   Td 07/26/2017    Preventative care: Last colonoscopy: 2019  Prior vaccinations: TD or Tdap: 2019 Influenza: Declined Pneumococcal: Declined Prevnar13: Declined Shingles/Zostavax: Declined  Eye exam: Dr. Sabra Heck 01/29/21 Dentist: Dr. Lear Ng 01/2021  Names of Other Physician/Practitioners you currently use: 1. [facility] here for primary care   Patient Care Team: Unk Pinto, MD as PCP - General  (Internal Medicine) Renda Rolls, Jennefer Bravo, MD as Referring Physician (Dermatology) Gatha Mayer, MD as Consulting Physician (Gastroenterology) Irine Seal, MD as Attending Physician (Urology) Marica Otter, Economy (Optometry)  Surgical: He  has a past surgical history that includes Prostate biopsy (2000, 12/2004); Extracorporeal shock wave lithotripsy [phone]); Hand ligament reconstruction (Right, 2009); Cystoscopy with retrograde pyelogram, ureteroscopy and stent placement (Bilateral, 04/19/2016); and Colonoscopy (2008). Family His family history includes Breast cancer in his sister; Cancer in his sister and sister; Colon cancer in his sister; Diabetes in his brother, brother, brother, father, mother, and sister; Heart attack in his father; Heart disease in his brother, father, and mother; Pancreatic cancer in his brother; Stroke in his brother. Social history  He reports that he quit smoking about 55 years ago. His smoking use included cigarettes. He quit smokeless tobacco use about 55 years ago.  His smokeless tobacco use included chew. He reports that he does not drink alcohol and does not use drugs.  MEDICARE WELLNESS OBJECTIVES: Physical activity: Current Exercise Habits: Home exercise routine, Type of exercise: Other - see comments (works in the garden), Time (Minutes): 30, Frequency (Times/Week): 4, Weekly Exercise (Minutes/Week): 120, Intensity: Mild Cardiac risk factors: Cardiac Risk Factors include: advanced age (>45mn, >>16women);dyslipidemia;hypertension;male gender;sedentary lifestyle Depression/mood screen:   [facility]  Decreased Interest 0  Down, Depressed, Hopeless 0  PHQ - 2 Score 0  Altered sleeping -  Tired, decreased energy -  Change in appetite -  Feeling bad or failure about yourself  -  Trouble concentrating -  Moving slowly or fidgety/restless -  Suicidal thoughts -  PHQ-9 Score -  Difficult doing work/chores -    ADLs:  In  your present state of health, do you have any difficulty performing the following activities: 03/31/2021 09/01/2020  Hearing? Y N  Vision? N N  Difficulty concentrating or making decisions? N N  Walking or climbing stairs? N N  Dressing or bathing? N N  Doing errands, shopping? N N  Some recent data might be hidden     Cognitive Testing  Alert? Yes  Normal Appearance?Yes  Oriented to person? Yes  Place? Yes   Time? Yes  Recall of three objects?  Yes  Can perform simple calculations? Yes  Displays appropriate judgment?Yes  Can read the correct time from a watch face?Yes  EOL planning: Does Patient Have a Medical Advance Directive?: No Would patient like information on creating a medical advance directive?: No - Patient declined  Review of Systems  Constitutional:  Negative for chills and fever.  HENT:  Negative for congestion, hearing loss, sinus pain, sore throat and tinnitus.   Eyes:  Negative for blurred vision and double vision.  Respiratory:  Negative for cough, hemoptysis, sputum production, shortness of breath and  wheezing.   Cardiovascular:  Negative for chest pain, palpitations and leg swelling.  Gastrointestinal:  Negative for abdominal pain, constipation, diarrhea, heartburn, nausea and vomiting.  Genitourinary:  Negative for dysuria and urgency.  Musculoskeletal:  Negative for back pain, falls, joint pain, myalgias and neck pain.  Skin:  Negative for rash.  Neurological:  Positive for tingling (bottom of feet). Negative for dizziness, tremors, weakness and headaches.  Endo/Heme/Allergies:  Does not bruise/bleed easily.  Psychiatric/Behavioral:  Negative for depression and suicidal ideas. The patient is not nervous/anxious and does not have insomnia.    Objective:   Today's Vitals   03/31/21 0930  BP: 140/78  Pulse: (!) 51  Resp: 16  Temp: 97.8 F (36.6 C)  SpO2: 97%  Weight: 198 lb 6.4 oz (90 kg)  Height: 6' 3.2" (1.91 m)    Body mass index is 24.67  kg/m.  General appearance: alert, no distress, WD/WN, male HEENT: normocephalic, sclerae anicteric, TMs pearly, nares patent, no discharge or erythema, pharynx normal Oral cavity: MMM, no lesions Neck: supple, no lymphadenopathy, no thyromegaly, no masses Heart: RRR, normal S1, S2, no murmurs Lungs: CTA bilaterally, no wheezes, rhonchi, or rales Abdomen: +bs, soft, non tender, non distended, no masses, no hepatomegaly, no splenomegaly Musculoskeletal: nontender, no swelling, no obvious deformity Extremities: no edema, no cyanosis, no clubbing Pulses: 2+ symmetric, upper and lower extremities, normal cap refill Neurological: alert, oriented x 3, CN2-12 intact, strength normal upper extremities and lower extremities, sensation normal throughout, DTRs 2+ throughout, no cerebellar signs, gait normal Psychiatric: normal affect, behavior normal, pleasant   Medicare Attestation I have personally reviewed: The patient's medical and social history Their use of alcohol, tobacco or illicit drugs Their current medications and supplements The patient's functional ability including ADLs,fall risks, home safety risks, cognitive, and hearing and visual impairment Diet and physical activities Evidence for depression or mood disorders  The patient's weight, height, BMI, and visual acuity have been recorded in the chart.  I have made referrals, counseling, and provided education to the patient based on review of the above and I have provided the patient with a written personalized care plan for preventive services.      Magda Bernheim ANP-C  [facility]  03/31/2021

## 2021-03-31 ENCOUNTER — Encounter: Payer: Self-pay | Admitting: Nurse Practitioner

## 2021-03-31 ENCOUNTER — Other Ambulatory Visit: Payer: Self-pay

## 2021-03-31 ENCOUNTER — Ambulatory Visit (INDEPENDENT_AMBULATORY_CARE_PROVIDER_SITE_OTHER): Payer: PPO | Admitting: Nurse Practitioner

## 2021-03-31 VITALS — BP 140/78 | HR 51 | Temp 97.8°F | Resp 16 | Ht 75.2 in | Wt 198.4 lb

## 2021-03-31 DIAGNOSIS — E559 Vitamin D deficiency, unspecified: Secondary | ICD-10-CM

## 2021-03-31 DIAGNOSIS — H9071 Mixed conductive and sensorineural hearing loss, unilateral, right ear, with unrestricted hearing on the contralateral side: Secondary | ICD-10-CM

## 2021-03-31 DIAGNOSIS — Z79899 Other long term (current) drug therapy: Secondary | ICD-10-CM

## 2021-03-31 DIAGNOSIS — E782 Mixed hyperlipidemia: Secondary | ICD-10-CM | POA: Diagnosis not present

## 2021-03-31 DIAGNOSIS — R6889 Other general symptoms and signs: Secondary | ICD-10-CM

## 2021-03-31 DIAGNOSIS — Z0001 Encounter for general adult medical examination with abnormal findings: Secondary | ICD-10-CM | POA: Diagnosis not present

## 2021-03-31 DIAGNOSIS — N401 Enlarged prostate with lower urinary tract symptoms: Secondary | ICD-10-CM | POA: Diagnosis not present

## 2021-03-31 DIAGNOSIS — Z6825 Body mass index (BMI) 25.0-25.9, adult: Secondary | ICD-10-CM | POA: Diagnosis not present

## 2021-03-31 DIAGNOSIS — N138 Other obstructive and reflux uropathy: Secondary | ICD-10-CM | POA: Diagnosis not present

## 2021-03-31 DIAGNOSIS — R001 Bradycardia, unspecified: Secondary | ICD-10-CM

## 2021-03-31 DIAGNOSIS — K219 Gastro-esophageal reflux disease without esophagitis: Secondary | ICD-10-CM | POA: Diagnosis not present

## 2021-03-31 DIAGNOSIS — I10 Essential (primary) hypertension: Secondary | ICD-10-CM | POA: Diagnosis not present

## 2021-03-31 DIAGNOSIS — M109 Gout, unspecified: Secondary | ICD-10-CM | POA: Diagnosis not present

## 2021-03-31 DIAGNOSIS — Z0000 Encounter for general adult medical examination without abnormal findings: Secondary | ICD-10-CM

## 2021-03-31 DIAGNOSIS — R7309 Other abnormal glucose: Secondary | ICD-10-CM | POA: Diagnosis not present

## 2021-03-31 NOTE — Patient Instructions (Addendum)
If pulse is consistently running in the 40's call the office If BP running less than 110/60 consistently call the office  Bradycardia, Adult Bradycardia is a slower-than-normal heartbeat. A normal resting heart rate for an adult ranges from 60 to 100 beats per minute. With bradycardia, the resting heart rate is less than 60 beats per minute. Bradycardia can prevent enough oxygen from reaching certain areas of your body when you are active. It can be serious if it keeps enough oxygen from reaching your brain and other parts of your body. Bradycardia is not a problem for everyone. For some healthy adults, a slow resting heart rate is normal. What are the causes? This condition may be caused by: A problem with the heart, including: A problem with the heart's electrical system, such as a heart block. With a heart block, electrical signals between the chambers of the heart are partially or completely blocked, so they are not able to work as they should. A problem with the heart's natural pacemaker (sinus node). Heart disease. A heart attack. Heart damage. Lyme disease. A heart infection. A heart condition that is present at birth (congenital heart defect). Certain medicines that treat heart conditions. Certain conditions, such as hypothyroidism and obstructive sleep apnea. Problems with the balance of chemicals and other substances, like potassium, in the blood. Trauma. Radiation therapy. What increases the risk? You are more likely to develop this condition if you: Are age 40 or older. Have high blood pressure (hypertension), high cholesterol (hyperlipidemia), or diabetes. Drink heavily, use tobacco or nicotine products, or use drugs. What are the signs or symptoms? Symptoms of this condition include: Light-headedness. Feeling faint or fainting. Fatigue and weakness. Trouble with activity or exercise. Shortness of breath. Chest pain (angina). Drowsiness. Confusion. Dizziness. How is  this diagnosed? This condition may be diagnosed based on: Your symptoms. Your medical history. A physical exam. During the exam, your health care provider will listen to your heartbeat and check your pulse. To confirm the diagnosis, your health care provider may order tests, such as: Blood tests. An electrocardiogram (ECG). This test records the heart's electrical activity. The test can show how fast your heart is beating and whether the heartbeat is steady. A test in which you wear a portable device (event recorder or Holter monitor) to record your heart's electrical activity while you go about your day. An exercise test. How is this treated? Treatment for this condition depends on the cause of the condition and how severe your symptoms are. Treatment may involve: Treatment of the underlying condition. Changing your medicines or how much medicine you take. Having a small, battery-operated device called a pacemaker implanted under the skin. When bradycardia occurs, this device can be used to increase your heart rate and help your heart beat in a regular rhythm. Follow these instructions at home: Lifestyle Manage any health conditions that contribute to bradycardia as told by your health care provider. Follow a heart-healthy diet. A nutrition specialist (dietitian) can help educate you about healthy food options and changes. Follow an exercise program that is approved by your health care provider. Maintain a healthy weight. Try to reduce or manage your stress, such as with yoga or meditation. If you need help reducing stress, ask your health care provider. Do not use any products that contain nicotine or tobacco. These products include cigarettes, chewing tobacco, and vaping devices, such as e-cigarettes. If you need help quitting, ask your health care provider. Do not use illegal drugs. Alcohol use If you  drink alcohol: Limit how much you have to: 0-1 drink a day for women who are not  pregnant. 0-2 drinks a day for men. Know how much alcohol is in a drink. In the [place] one drink equals one 12 oz bottle of beer (355 mL), one 5 oz glass of wine (148 mL), or one 1 oz glass of hard liquor (44 mL). General instructions Take over-the-counter and prescription medicines only as told by your health care provider. Keep all follow-up visits. This is important. How is this prevented? In some cases, bradycardia may be prevented by: Treating underlying medical problems. Stopping behaviors or medicines that can trigger the condition. Contact a health care provider if: You feel light-headed or dizzy. You almost faint. You feel weak or are easily fatigued during physical activity. You experience confusion or have memory problems. Get help right away if: You faint. You have chest pains or an irregular heartbeat (palpitations). You have trouble breathing. These symptoms may represent a serious problem that is an emergency. Do not wait to see if the symptoms will go away. Get medical help right away. Call your local emergency services (911 in the [place] Do not drive yourself to the hospital. Summary Bradycardia is a slower-than-normal heartbeat. With bradycardia, the resting heart rate is less than 60 beats per minute. Treatment for this condition depends on the cause. Manage any health conditions that contribute to bradycardia as told by your health care provider. Do not use any products that contain nicotine or tobacco. These products include cigarettes, chewing tobacco, and vaping devices, such as e-cigarettes. Keep all follow-up visits. This is important. This information is not intended to replace advice given to you by your health care provider. Make sure you discuss any questions you have with your health care provider. Document Revised: 08/30/2020 Document Reviewed: 08/30/2020 Elsevier Patient Education  [place].

## 2021-04-01 DIAGNOSIS — D0462 Carcinoma in situ of skin of left upper limb, including shoulder: Secondary | ICD-10-CM | POA: Diagnosis not present

## 2021-04-01 DIAGNOSIS — L570 Actinic keratosis: Secondary | ICD-10-CM | POA: Diagnosis not present

## 2021-04-01 LAB — CBC WITH DIFFERENTIAL/PLATELET
Absolute Monocytes: 469 cells/uL (ref 200–950)
Basophils Absolute: 41 cells/uL (ref 0–200)
Basophils Relative: 0.6 %
Eosinophils Absolute: 88 cells/uL (ref 15–500)
Eosinophils Relative: 1.3 %
HCT: 42.3 % (ref 38.5–50.0)
Hemoglobin: 14.3 g/dL (ref 13.2–17.1)
Lymphs Abs: 1435 cells/uL (ref 850–3900)
MCH: 32.3 pg (ref 27.0–33.0)
MCHC: 33.8 g/dL (ref 32.0–36.0)
MCV: 95.5 fL (ref 80.0–100.0)
MPV: 9.9 fL (ref 7.5–12.5)
Monocytes Relative: 6.9 %
Neutro Abs: 4767 cells/uL (ref 1500–7800)
Neutrophils Relative %: 70.1 %
Platelets: 199 10*3/uL (ref 140–400)
RBC: 4.43 10*6/uL (ref 4.20–5.80)
RDW: 12.3 % (ref 11.0–15.0)
Total Lymphocyte: 21.1 %
WBC: 6.8 10*3/uL (ref 3.8–10.8)

## 2021-04-01 LAB — LIPID PANEL
Cholesterol: 174 mg/dL (ref <200)
HDL: 55 mg/dL (ref >=40)
LDL Cholesterol (Calc): 101 mg/dL (calc) — ABNORMAL HIGH
Non-HDL Cholesterol (Calc): 119 mg/dL (calc) (ref <130)
Total CHOL/HDL Ratio: 3.2 (calc) (ref <5.0)
Triglycerides: 87 mg/dL (ref <150)

## 2021-04-01 LAB — COMPLETE METABOLIC PANEL WITH GFR
AG Ratio: 1.9 (calc) (ref 1.0–2.5)
ALT: 22 U/L (ref 9–46)
AST: 22 U/L (ref 10–35)
Albumin: 4.5 g/dL (ref 3.6–5.1)
Alkaline phosphatase (APISO): 69 U/L (ref 35–144)
BUN/Creatinine Ratio: 20 (calc) (ref 6–22)
BUN: 13 mg/dL (ref 7–25)
CO2: 27 mmol/L (ref 20–32)
Calcium: 9.8 mg/dL (ref 8.6–10.3)
Chloride: 102 mmol/L (ref 98–110)
Creat: 0.65 mg/dL — ABNORMAL LOW (ref 0.70–1.22)
Globulin: 2.4 g/dL (calc) (ref 1.9–3.7)
Glucose, Bld: 96 mg/dL (ref 65–99)
Potassium: 4.4 mmol/L (ref 3.5–5.3)
Sodium: 141 mmol/L (ref 135–146)
Total Bilirubin: 0.7 mg/dL (ref 0.2–1.2)
Total Protein: 6.9 g/dL (ref 6.1–8.1)
eGFR: 95 mL/min/{1.73_m2} (ref >=60)

## 2021-04-01 LAB — TSH: TSH: 1.71 mIU/L (ref 0.40–4.50)

## 2021-04-29 ENCOUNTER — Encounter: Payer: Self-pay | Admitting: Internal Medicine

## 2021-05-21 DIAGNOSIS — R239 Unspecified skin changes: Secondary | ICD-10-CM | POA: Diagnosis not present

## 2021-05-21 DIAGNOSIS — Z4889 Encounter for other specified surgical aftercare: Secondary | ICD-10-CM | POA: Diagnosis not present

## 2021-05-21 DIAGNOSIS — Z23 Encounter for immunization: Secondary | ICD-10-CM | POA: Diagnosis not present

## 2021-05-21 DIAGNOSIS — T888XXA Other specified complications of surgical and medical care, not elsewhere classified, initial encounter: Secondary | ICD-10-CM | POA: Diagnosis not present

## 2021-05-31 DIAGNOSIS — H52223 Regular astigmatism, bilateral: Secondary | ICD-10-CM | POA: Diagnosis not present

## 2021-05-31 DIAGNOSIS — H401131 Primary open-angle glaucoma, bilateral, mild stage: Secondary | ICD-10-CM | POA: Diagnosis not present

## 2021-05-31 DIAGNOSIS — H524 Presbyopia: Secondary | ICD-10-CM | POA: Diagnosis not present

## 2021-06-21 ENCOUNTER — Other Ambulatory Visit: Payer: Self-pay | Admitting: Adult Health

## 2021-06-21 DIAGNOSIS — K219 Gastro-esophageal reflux disease without esophagitis: Secondary | ICD-10-CM

## 2021-06-28 ENCOUNTER — Other Ambulatory Visit: Payer: Self-pay | Admitting: Internal Medicine

## 2021-08-12 ENCOUNTER — Encounter (HOSPITAL_COMMUNITY): Payer: Self-pay

## 2021-08-12 ENCOUNTER — Observation Stay (HOSPITAL_COMMUNITY): Payer: PPO

## 2021-08-12 ENCOUNTER — Other Ambulatory Visit: Payer: Self-pay

## 2021-08-12 ENCOUNTER — Observation Stay (HOSPITAL_COMMUNITY)
Admission: EM | Admit: 2021-08-12 | Discharge: 2021-08-13 | Disposition: A | Discharge status: Home / Self Care | Payer: PPO | Attending: Internal Medicine | Admitting: Internal Medicine

## 2021-08-12 ENCOUNTER — Observation Stay (HOSPITAL_BASED_OUTPATIENT_CLINIC_OR_DEPARTMENT_OTHER): Payer: PPO

## 2021-08-12 DIAGNOSIS — N400 Enlarged prostate without lower urinary tract symptoms: Secondary | ICD-10-CM | POA: Diagnosis present

## 2021-08-12 DIAGNOSIS — Z79899 Other long term (current) drug therapy: Secondary | ICD-10-CM | POA: Insufficient documentation

## 2021-08-12 DIAGNOSIS — I959 Hypotension, unspecified: Secondary | ICD-10-CM | POA: Diagnosis not present

## 2021-08-12 DIAGNOSIS — Z7984 Long term (current) use of oral hypoglycemic drugs: Secondary | ICD-10-CM | POA: Insufficient documentation

## 2021-08-12 DIAGNOSIS — R55 Syncope and collapse: Secondary | ICD-10-CM

## 2021-08-12 DIAGNOSIS — Z87891 Personal history of nicotine dependence: Secondary | ICD-10-CM | POA: Diagnosis not present

## 2021-08-12 DIAGNOSIS — M1000 Idiopathic gout, unspecified site: Secondary | ICD-10-CM | POA: Diagnosis present

## 2021-08-12 DIAGNOSIS — R1084 Generalized abdominal pain: Secondary | ICD-10-CM | POA: Diagnosis not present

## 2021-08-12 DIAGNOSIS — R7303 Prediabetes: Secondary | ICD-10-CM | POA: Diagnosis present

## 2021-08-12 DIAGNOSIS — M109 Gout, unspecified: Secondary | ICD-10-CM | POA: Diagnosis present

## 2021-08-12 DIAGNOSIS — E782 Mixed hyperlipidemia: Secondary | ICD-10-CM | POA: Diagnosis present

## 2021-08-12 DIAGNOSIS — I10 Essential (primary) hypertension: Secondary | ICD-10-CM | POA: Diagnosis not present

## 2021-08-12 DIAGNOSIS — N209 Urinary calculus, unspecified: Secondary | ICD-10-CM

## 2021-08-12 DIAGNOSIS — Z85828 Personal history of other malignant neoplasm of skin: Secondary | ICD-10-CM | POA: Insufficient documentation

## 2021-08-12 DIAGNOSIS — Z7982 Long term (current) use of aspirin: Secondary | ICD-10-CM | POA: Diagnosis not present

## 2021-08-12 DIAGNOSIS — R001 Bradycardia, unspecified: Secondary | ICD-10-CM | POA: Diagnosis not present

## 2021-08-12 DIAGNOSIS — K219 Gastro-esophageal reflux disease without esophagitis: Secondary | ICD-10-CM | POA: Diagnosis present

## 2021-08-12 DIAGNOSIS — M1A00X0 Idiopathic chronic gout, unspecified site, without tophus (tophi): Secondary | ICD-10-CM | POA: Diagnosis present

## 2021-08-12 DIAGNOSIS — L719 Rosacea, unspecified: Secondary | ICD-10-CM | POA: Insufficient documentation

## 2021-08-12 DIAGNOSIS — E559 Vitamin D deficiency, unspecified: Secondary | ICD-10-CM | POA: Diagnosis present

## 2021-08-12 LAB — GLUCOSE, CAPILLARY: Glucose-Capillary: 183 mg/dL — ABNORMAL HIGH (ref 70–99)

## 2021-08-12 LAB — ECHOCARDIOGRAM COMPLETE
AR max vel: 2.63 cm2
AV Area VTI: 2.82 cm2
AV Area mean vel: 2.47 cm2
AV Mean grad: 5 mmHg
AV Peak grad: 9.4 mmHg
Ao pk vel: 1.54 m/s
Area-P 1/2: 3.91 cm2
Calc EF: 75.1 %
Height: 75 in
S' Lateral: 2.7 cm
Single Plane A2C EF: 71.7 %
Single Plane A4C EF: 79.2 %
Weight: 3136 oz

## 2021-08-12 LAB — URINALYSIS, ROUTINE W REFLEX MICROSCOPIC
Bilirubin Urine: NEGATIVE
Glucose, UA: NEGATIVE mg/dL
Hgb urine dipstick: NEGATIVE
Ketones, ur: NEGATIVE mg/dL
Leukocytes,Ua: NEGATIVE
Nitrite: NEGATIVE
Protein, ur: NEGATIVE mg/dL
Specific Gravity, Urine: 1.01 (ref 1.005–1.030)
pH: 7 (ref 5.0–8.0)

## 2021-08-12 LAB — HEPATIC FUNCTION PANEL
ALT: 24 U/L (ref 0–44)
AST: 27 U/L (ref 15–41)
Albumin: 3.9 g/dL (ref 3.5–5.0)
Alkaline Phosphatase: 56 U/L (ref 38–126)
Bilirubin, Direct: 0.1 mg/dL (ref 0.0–0.2)
Indirect Bilirubin: 0.6 mg/dL (ref 0.3–0.9)
Total Bilirubin: 0.7 mg/dL (ref 0.3–1.2)
Total Protein: 6.5 g/dL (ref 6.5–8.1)

## 2021-08-12 LAB — BASIC METABOLIC PANEL
Anion gap: 7 (ref 5–15)
BUN: 15 mg/dL (ref 8–23)
CO2: 27 mmol/L (ref 22–32)
Calcium: 8.5 mg/dL — ABNORMAL LOW (ref 8.9–10.3)
Chloride: 100 mmol/L (ref 98–111)
Creatinine, Ser: 0.74 mg/dL (ref 0.61–1.24)
GFR, Estimated: >60 mL/min (ref >60)
Glucose, Bld: 168 mg/dL — ABNORMAL HIGH (ref 70–99)
Potassium: 4 mmol/L (ref 3.5–5.1)
Sodium: 134 mmol/L — ABNORMAL LOW (ref 135–145)

## 2021-08-12 LAB — HEMOGLOBIN A1C
Hgb A1c MFr Bld: 5.6 % (ref 4.8–5.6)
Mean Plasma Glucose: 114.02 mg/dL

## 2021-08-12 LAB — CBC
HCT: 40 % (ref 39.0–52.0)
Hemoglobin: 13.9 g/dL (ref 13.0–17.0)
MCH: 32.5 pg (ref 26.0–34.0)
MCHC: 34.8 g/dL (ref 30.0–36.0)
MCV: 93.5 fL (ref 80.0–100.0)
Platelets: 168 10*3/uL (ref 150–400)
RBC: 4.28 MIL/uL (ref 4.22–5.81)
RDW: 12.8 % (ref 11.5–15.5)
WBC: 8.3 10*3/uL (ref 4.0–10.5)
nRBC: 0 % (ref 0.0–0.2)

## 2021-08-12 LAB — TROPONIN I (HIGH SENSITIVITY)
Troponin I (High Sensitivity): 13 ng/L (ref <18)
Troponin I (High Sensitivity): 17 ng/L (ref <18)

## 2021-08-12 LAB — CBG MONITORING, ED
Glucose-Capillary: 165 mg/dL — ABNORMAL HIGH (ref 70–99)
Glucose-Capillary: 143 mg/dL — ABNORMAL HIGH (ref 70–99)

## 2021-08-12 MED ORDER — DOXAZOSIN MESYLATE 4 MG PO TABS
4.0000 mg | ORAL_TABLET | Freq: Every day | ORAL | Status: DC
Start: 2021-08-12 — End: 2021-08-13
  Administered 2021-08-12: 4 mg via ORAL
  Filled 2021-08-12: qty 1

## 2021-08-12 MED ORDER — ONDANSETRON HCL 4 MG/2ML IJ SOLN: 4.0000 mg | Freq: Four times a day (QID) | INTRAMUSCULAR | Status: DC | PRN

## 2021-08-12 MED ORDER — SODIUM CHLORIDE 0.9% FLUSH
3.0000 mL | Freq: Two times a day (BID) | INTRAVENOUS | Status: DC
  Administered 2021-08-12 (×2): 3 mL via INTRAVENOUS

## 2021-08-12 MED ORDER — FINASTERIDE 5 MG PO TABS
5.0000 mg | ORAL_TABLET | Freq: Every evening | ORAL | Status: DC
  Administered 2021-08-12: 5 mg via ORAL
  Filled 2021-08-12: qty 1

## 2021-08-12 MED ORDER — ATORVASTATIN CALCIUM 10 MG PO TABS
10.0000 mg | ORAL_TABLET | Freq: Every day | ORAL | Status: DC
  Administered 2021-08-12 – 2021-08-13 (×2): 10 mg via ORAL
  Filled 2021-08-12 (×2): qty 1

## 2021-08-12 MED ORDER — ACETAMINOPHEN 650 MG RE SUPP: 650.0000 mg | Freq: Four times a day (QID) | RECTAL | Status: DC | PRN

## 2021-08-12 MED ORDER — ASPIRIN EC 81 MG PO TBEC
81.0000 mg | DELAYED_RELEASE_TABLET | Freq: Every day | ORAL | Status: DC
  Administered 2021-08-12 – 2021-08-13 (×2): 81 mg via ORAL
  Filled 2021-08-12 (×2): qty 1

## 2021-08-12 MED ORDER — ONDANSETRON HCL 4 MG PO TABS: 4.0000 mg | ORAL_TABLET | Freq: Four times a day (QID) | ORAL | Status: DC | PRN

## 2021-08-12 MED ORDER — MAGNESIUM OXIDE -MG SUPPLEMENT 400 (240 MG) MG PO TABS
200.0000 mg | ORAL_TABLET | Freq: Two times a day (BID) | ORAL | Status: DC
  Administered 2021-08-12 – 2021-08-13 (×2): 200 mg via ORAL
  Filled 2021-08-12 (×2): qty 1

## 2021-08-12 MED ORDER — PANTOPRAZOLE SODIUM 40 MG PO TBEC
40.0000 mg | DELAYED_RELEASE_TABLET | Freq: Every day | ORAL | Status: DC
  Administered 2021-08-12 – 2021-08-13 (×2): 40 mg via ORAL
  Filled 2021-08-12 (×2): qty 1

## 2021-08-12 MED ORDER — MAGNESIUM 250 MG PO TABS: 250.0000 mg | ORAL_TABLET | Freq: Two times a day (BID) | ORAL | Status: DC

## 2021-08-12 MED ORDER — LATANOPROST 0.005 % OP SOLN
1.0000 [drp] | Freq: Every day | OPHTHALMIC | Status: DC
  Administered 2021-08-12: 1 [drp] via OPHTHALMIC
  Filled 2021-08-12: qty 2.5

## 2021-08-12 MED ORDER — LACTATED RINGERS IV SOLN: INTRAVENOUS | Status: AC

## 2021-08-12 MED ORDER — MINOXIDIL 2.5 MG PO TABS
2.5000 mg | ORAL_TABLET | Freq: Every day | ORAL | Status: DC
  Administered 2021-08-12: 5 mg via ORAL
  Administered 2021-08-13: 2.5 mg via ORAL
  Filled 2021-08-12 (×2): qty 2

## 2021-08-12 MED ORDER — LACTATED RINGERS IV BOLUS
500.0000 mL | Freq: Once | INTRAVENOUS | Status: AC
  Administered 2021-08-12: 500 mL via INTRAVENOUS

## 2021-08-12 MED ORDER — ALLOPURINOL 300 MG PO TABS
150.0000 mg | ORAL_TABLET | Freq: Every day | ORAL | Status: DC
  Administered 2021-08-12 – 2021-08-13 (×2): 150 mg via ORAL
  Filled 2021-08-12: qty 1
  Filled 2021-08-12: qty 0.5

## 2021-08-12 MED ORDER — ENOXAPARIN SODIUM 40 MG/0.4ML IJ SOSY: 40.0000 mg | PREFILLED_SYRINGE | INTRAMUSCULAR | Status: DC

## 2021-08-12 MED ORDER — LATANOPROST 0.005 % OP SOLN: 1.0000 [drp] | Freq: Every day | OPHTHALMIC | Status: DC

## 2021-08-12 MED ORDER — ACETAMINOPHEN 325 MG PO TABS: 650.0000 mg | ORAL_TABLET | Freq: Four times a day (QID) | ORAL | Status: DC | PRN

## 2021-08-12 MED ORDER — ENOXAPARIN SODIUM 40 MG/0.4ML IJ SOSY
40.0000 mg | PREFILLED_SYRINGE | INTRAMUSCULAR | Status: DC
  Administered 2021-08-12: 40 mg via SUBCUTANEOUS
  Filled 2021-08-12: qty 0.4

## 2021-08-12 MED ORDER — ENALAPRIL MALEATE 10 MG PO TABS
10.0000 mg | ORAL_TABLET | Freq: Two times a day (BID) | ORAL | Status: DC
Start: 2021-08-12 — End: 2021-08-13
  Administered 2021-08-12 – 2021-08-13 (×3): 10 mg via ORAL
  Filled 2021-08-12 (×3): qty 1

## 2021-08-12 NOTE — Progress Notes (Signed)
Carotid artery duplex has been completed. ?Preliminary results can be found in CV Proc through chart review.  ? ?08/12/21 11:47 AM ?Carlos Levering RVT   ?

## 2021-08-12 NOTE — Plan of Care (Signed)

## 2021-08-12 NOTE — Consult Note (Addendum)
?Cardiology Consultation:  ? ?Patient ID: Michael Waller ?MRN: [id number]; DOB: 1940/05/26 ? ?Admit date: 08/12/2021 ?Date of Consult: 08/12/2021 ? ?PCP:  Unk Pinto, MD ?  ?[facility] Providers ?Cardiologist:  New to [facility] ? ? ?Patient Profile:  ? ?Michael Waller is a 81 y.o. male with a hx of bradycardia, hypertension, hyperlipidemia, prediabetes, BPH and history of gout who is being seen 08/12/2021 for the evaluation of syncope at the request of Dr. Olevia Bowens. ? ?History of Present Illness:  ? ?Mr. Michael Waller is a 81 year old male was past medical history of bradycardia, hypertension, hyperlipidemia, prediabetes, BPH and history of gout.  He has never been evaluated by cardiology service.  Two of his brothers were diagnosed with heart issue in their 60s.  Both parents died of heart attack later in life.  Patient himself however has never had a heart attack or stroke.  His bisoprolol-hydrochlorothiazide combination was cut back 2 years ago due to bradycardia.  His heart rate has been in the 50s range.  He is also on enalapril, minoxidil and Cardura at home.  7 years ago, while working in his garage, he had a passing out spell.  He has been doing well for the past several years without recurrent passing out spell until this morning.  He has intermittent abdominal discomfort and gas issue.  He woke up early this morning complaining of reflux and gas, he quickly went back to sleep.  By the time he woke up around 5:30 AM, symptoms has completely resolved.  He says the abdominal discomfort and gas issue is chronic for him.  He had oatmeal for breakfast.  Afterward, he said that he found in his computer to read electronic Bible.  While reading the Bible, he had progressive vision changes.  He called out his wife's name before passing out.  His wife walked into the room finding the patient's had leaned back.  His eyes rolled backward and his mouth wide open.  She initially thought that the patient died.  While she was  calling 911, the patient woke up.  Total downtime was 4-5 minutes.  He denied any chest pain, palpitation or shortness of breath prior to or after the syncope.  According to his wife, blood pressure obtained by EMS was actually high in the 180s.  While being examined by EMS, he had nausea, vomiting, flushing sensation and the sweating.  He was subsequently transferred to [facility] ED for further evaluation.  He has been admitted by hospitalist service.  On arrival, blood pressure was normal.  O2 saturation 95% on room air.  Temperature normal.  Patient was given IV fluids.  Sodium level 134, blood sugar 168, normal white blood cell count, red blood cell count, platelet level.  Urinalysis negative.  Chest x-ray normal.  Carotid Doppler showed 1 to 39% disease bilaterally, antegrade flow in bilateral vertebral arteries.  Cardiology service was consulted for evaluation of syncope. ? ? ?Past Medical History:  ?Diagnosis Date  ? Benign localized prostatic hyperplasia with lower urinary tract symptoms (LUTS)   ? Bruises easily   ? Elevated PSA   ? GERD (gastroesophageal reflux disease)   ? History of gout   ? History of kidney stones   ? Hyperlipidemia   ? Hypertension   ? Nephrolithiasis   ? Nocturia   ? Pre-diabetes   ? Vitamin D deficiency   ? Wears dentures   ? full upper and lower partial   ? ? ?Past Surgical History:  ?Procedure Laterality  Date  ? COLONOSCOPY  2008  ? CYSTOSCOPY WITH RETROGRADE PYELOGRAM, URETEROSCOPY AND STENT PLACEMENT Bilateral 04/19/2016  ? Procedure: CYSTOSCOPY WITH BILATERAL RETROGRADE URETEROSCOPY BASKET EXTRACTION;  Surgeon: Irine Seal, MD;  Location: [facility];  Service: Urology;  Laterality: Bilateral;  ? EXTRACORPOREAL SHOCK WAVE LITHOTRIPSY  [phone]  ? HAND LIGAMENT RECONSTRUCTION Right 2009  ? PROSTATE BIOPSY  2000, 12/2004  ? negative  ?  ? ?Home Medications:  ?Prior to Admission medications   ?Medication Sig Start Date End Date Taking? Authorizing Provider   ?acetaminophen (TYLENOL) 500 MG tablet Take 1,000 mg by mouth every 6 (six) hours as needed for mild pain.   Yes [provider]  ?Alfalfa 500 MG TABS Take 1,000 mg by mouth daily.   Yes [provider]  ?allopurinol (ZYLOPRIM) 300 MG tablet TAKE 1 TABLET BY MOUTH DAILY FOR GOUT ?Patient taking differently: Take 150 mg by mouth daily. 12/12/20  Yes Magda Bernheim, NP  ?aspirin 81 MG tablet Take 81 mg by mouth daily.   Yes [provider]  ?atorvastatin (LIPITOR) 10 MG tablet TAKE 1 TABLET BY MOUTH DAILY FOR CHOLESTEROL ?Patient taking differently: Take 10 mg by mouth daily. 06/28/21  Yes Magda Bernheim, NP  ?bisoprolol-hydrochlorothiazide (ZIAC) 2.5-6.25 MG tablet TAKE 1 TABLET BY MOUTH DAILY FOR BLOOD PRESSURE ?Patient taking differently: Take 1 tablet by mouth daily. 08/20/20  Yes Unk Pinto, MD  ?cetirizine (ZYRTEC) 10 MG tablet Take 10 mg by mouth daily as needed for allergies.   Yes [provider]  ?Cholecalciferol (VITAMIN D3) 2000 units TABS Take 6,000 Units by mouth daily.   Yes [provider]  ?Cinnamon 500 MG TABS Take 1,000 mg by mouth daily.   Yes [provider]  ?doxazosin (CARDURA) 8 MG tablet Take 1/2 tablet at Bedtime for BP & Prostate ?Patient taking differently: Take 4 mg by mouth daily. Take 1/2 tablet at Bedtime for BP & Prostate 08/20/20  Yes Unk Pinto, MD  ?enalapril (VASOTEC) 20 MG tablet TAKE 1 TABLET BY MOUTH TWICE DAILY FOR BLOOD PRESSURE ?Patient taking differently: Take 10 mg by mouth 2 (two) times daily. 11/24/20  Yes Magda Bernheim, NP  ?finasteride (PROSCAR) 5 MG tablet Take 5 mg by mouth every evening.    Yes [provider]  ?Garlic 4158 MG CAPS Take 1,000 mg by mouth daily.   Yes [provider]  ?latanoprost (XALATAN) 0.005 % ophthalmic solution Place 1 drop into both eyes daily. 05/06/19  Yes [provider]  ?Magnesium 250 MG TABS Take 250 mg by mouth 2 (two) times daily. Breakfast and lunch    Yes [provider]  ?minoxidil (LONITEN) 2.5 MG tablet Take  1 to 2 tablets  every Morning  for BP ?Patient taking differently: Take 2.5-5 mg by mouth daily. 08/24/20  Yes Unk Pinto, MD  ?Multiple Vitamin (MULTIVITAMIN) tablet Take 1 tablet by mouth daily.   Yes [provider]  ?Omega-3 Fatty Acids (OMEGA-3 FISH OIL) 1200 MG CAPS Take 1,200 mg by mouth daily with lunch.   Yes [provider]  ?omeprazole (PRILOSEC) 20 MG capsule TAKE 1 CAPSULE BY MOUTH DAILY ?Patient taking differently: 20 mg daily. 06/21/21  Yes Magda Bernheim, NP  ?zinc gluconate 50 MG tablet Take 50 mg by mouth daily.   Yes [provider]  ?blood glucose meter kit and supplies Dispense based insurance preference. E11.9 07/05/17   Unk Pinto, MD  ?glucose blood (ONE TOUCH ULTRA TEST) test strip  USE TO CHECK BLOOD SUGAR ONCE DAILY 07/05/17   Unk Pinto, MD  ?glucose blood [facility] ULTRA) test strip Check   Blood Sugar   Daily  (Dx: e11.9) 08/24/20   Unk Pinto, MD  ?glucose blood test strip Check blood sugar once daily 09/26/16   Vladimir Crofts, PA-C  ?OneTouch Delica Lancets 93T MISC Check blood sugar 1 time a day. 09/20/18   Unk Pinto, MD  ? ? ?Inpatient Medications: ?Scheduled Meds: ? allopurinol  150 mg Oral Daily  ? aspirin EC  81 mg Oral Daily  ? atorvastatin  10 mg Oral Daily  ? doxazosin  4 mg Oral QHS  ? enalapril  10 mg Oral BID  ? enoxaparin (LOVENOX) injection  40 mg Subcutaneous Q24H  ? finasteride  5 mg Oral QPM  ? latanoprost  1 drop Both Eyes Daily  ? magnesium oxide  200 mg Oral BID WC  ? minoxidil  2.5-5 mg Oral Daily  ? pantoprazole  40 mg Oral Daily  ? sodium chloride flush  3 mL Intravenous Q12H  ? ?Continuous Infusions: ? lactated ringers 125 mL/hr at 08/12/21 1226  ? ?PRN Meds: ?acetaminophen **OR** acetaminophen, ondansetron **OR** ondansetron (ZOFRAN) IV ? ?Allergies:    ?Allergies  ?Allergen Reactions  ? Adhesive [Tape] Other (See Comments)  ?  reddness   ? ? ?Social History:   ?Social History  ? ?Socioeconomic History  ? Marital status: Married  ?  Spouse name: Not on file  ? Number of children: Not on file  ? Years of education: Not on file  ? Highest education l

## 2021-08-12 NOTE — ED Triage Notes (Signed)
Pt coming from home via EMS with a syncopal episode this morning. Pt reports that he woke up, ate breakfast and went to sit down, started to feel unwell, then had a syncopal episode while seated. Per EMS wife reports that he wasn't unconscious for "more than a minute." Patient had multiple episodes of nausea and vomiting after coming to. Pt reports a HR around 50 as baseline.  ?

## 2021-08-12 NOTE — H&P (Signed)
?History and Physical  ? ? ?Patient: Michael Waller SLH:[id number] DOB: Dec 23, 1940 ?DOA: 08/12/2021 ?DOS: the patient was seen and examined on 08/12/2021 ?PCP: Unk Pinto, MD  ?Patient coming from: Home ? ?Chief Complaint:  ?Chief Complaint  ?Patient presents with  ? Loss of Consciousness  ? ?HPI: Michael Waller is a 81 y.o. male with medical history significant of BPH, easy bruising, GERD, gout, urolithiasis, hyperlipidemia, hypertension, prediabetes, vitamin D deficiency who is coming to the emergency department via EMS after having a syncopal episode followed by 4 to 5 minutes of LOC at home.  The patient stated he was having "reflux and gas" earlier morning, got up, had an apple and went back to sleep.  Then he woke up around 5:30 in the morning without any symptoms.  Then an hour and a half or 2 later, he sat down to have his usual bowl of oatmeal.  He was sitting in front of the computer reading his computer version of the Bible when he felt that his eyes were getting blurry, he called him on his wife, but then passed out and does not remember anything else.  His wife stated he was unconscious for 4 to 5 minutes.  After he woke up, he had several episodes of nausea and emesis associated with epigastric discomfort.  The EMS crew gave him an antiemetic which relieved his nausea.  He denied chest pain, palpitations, diaphoresis, recent lower extremity edema, PND orthopnea.  He had a similar episode in his garage about 7 or more years ago.  A few months ago, his beta-blocker was decreased due to lightheadedness.  He has been occasionally constipated.  He also complains of nocturia and has occasional mild flank pain, which he attributes to his nephrolithiasis episodes.  No fever, sore throat, rhinorrhea, productive cough, dyspnea, wheezing or hemoptysis.  No melena or hematochezia.  No dysuria or hematuria.  No polyuria, polydipsia, polyphagia or blurred vision. ? ?ED course: Initial vital signs were  temperature 97.6 ?F, pulse 60, respirations 16, BP 128/69 mmHg and O2 sat 95% on room air.  The patient received a 500 mL LR bolus. ? ?Lab work: EKG was sinus bradycardia 57 bpm.  His CBC was normal.  BMP showed a glucose of 168 and calcium of 8.5 mg/dL.  The rest of the BMP is normal after sodium is corrected.  No albumin level available for calcium level correction. ?  ?Review of Systems: As mentioned in the history of present illness. All other systems reviewed and are negative. ?Past Medical History:  ?Diagnosis Date  ? Benign localized prostatic hyperplasia with lower urinary tract symptoms (LUTS)   ? Bruises easily   ? Elevated PSA   ? GERD (gastroesophageal reflux disease)   ? History of gout   ? History of kidney stones   ? Hyperlipidemia   ? Hypertension   ? Nephrolithiasis   ? Nocturia   ? Pre-diabetes   ? Vitamin D deficiency   ? Wears dentures   ? full upper and lower partial   ? ?Past Surgical History:  ?Procedure Laterality Date  ? COLONOSCOPY  2008  ? CYSTOSCOPY WITH RETROGRADE PYELOGRAM, URETEROSCOPY AND STENT PLACEMENT Bilateral 04/19/2016  ? Procedure: CYSTOSCOPY WITH BILATERAL RETROGRADE URETEROSCOPY BASKET EXTRACTION;  Surgeon: Irine Seal, MD;  Location: [facility];  Service: Urology;  Laterality: Bilateral;  ? EXTRACORPOREAL SHOCK WAVE LITHOTRIPSY  [phone]  ? HAND LIGAMENT RECONSTRUCTION Right 2009  ? PROSTATE BIOPSY  2000, 12/2004  ? negative  ? ?  Social History:  reports that he quit smoking about 56 years ago. His smoking use included cigarettes. He quit smokeless tobacco use about 55 years ago.  His smokeless tobacco use included chew. He reports that he does not drink alcohol and does not use drugs. ? ?Allergies  ?Allergen Reactions  ? Adhesive [Tape] Other (See Comments)  ?  reddness  ? ? ?Family History  ?Problem Relation Age of Onset  ? Heart disease Mother   ? Diabetes Mother   ? Diabetes Father   ? Heart disease Father   ? Heart attack Father   ? Diabetes Sister    ? Colon cancer Sister   ? Diabetes Brother   ? Pancreatic cancer Brother   ? Diabetes Brother   ? Diabetes Brother   ? Heart disease Brother   ? Stroke Brother   ? Cancer Sister   ?     breast  ? Breast cancer Sister   ? Cancer Sister   ?     colon  ? Stomach cancer Neg Hx   ? ? ?Prior to Admission medications   ?Medication Sig Start Date End Date Taking? Authorizing Provider  ?acetaminophen (TYLENOL) 500 MG tablet Take 1,000 mg by mouth every 6 (six) hours as needed for mild pain.   Yes [provider]  ?Alfalfa 500 MG TABS Take 1,000 mg by mouth daily.   Yes [provider]  ?allopurinol (ZYLOPRIM) 300 MG tablet TAKE 1 TABLET BY MOUTH DAILY FOR GOUT ?Patient taking differently: Take 150 mg by mouth daily. 12/12/20  Yes Magda Bernheim, NP  ?aspirin 81 MG tablet Take 81 mg by mouth daily.   Yes [provider]  ?atorvastatin (LIPITOR) 10 MG tablet TAKE 1 TABLET BY MOUTH DAILY FOR CHOLESTEROL 06/28/21  Yes Magda Bernheim, NP  ?bisoprolol-hydrochlorothiazide (ZIAC) 2.5-6.25 MG tablet TAKE 1 TABLET BY MOUTH DAILY FOR BLOOD PRESSURE 08/20/20  Yes Unk Pinto, MD  ?cetirizine (ZYRTEC) 10 MG tablet Take 10 mg by mouth daily as needed for allergies.   Yes [provider]  ?Cholecalciferol (VITAMIN D3) 2000 units TABS Take 3 tablets by mouth daily.   Yes [provider]  ?Cinnamon 500 MG TABS Take 2 tablets by mouth daily.   Yes [provider]  ?doxazosin (CARDURA) 8 MG tablet Take 1/2 tablet at Bedtime for BP & Prostate ?Patient taking differently: Take 4 mg by mouth daily. Take 1/2 tablet at Bedtime for BP & Prostate 08/20/20  Yes Unk Pinto, MD  ?enalapril (VASOTEC) 20 MG tablet TAKE 1 TABLET BY MOUTH TWICE DAILY FOR BLOOD PRESSURE ?Patient taking differently: Take 10 mg by mouth 2 (two) times daily. 11/24/20  Yes Magda Bernheim, NP  ?finasteride (PROSCAR) 5 MG tablet Take 5 mg by mouth every evening.    Yes [provider]  ?Garlic 5102 MG CAPS Take 1,000 mg  by mouth daily.   Yes [provider]  ?latanoprost (XALATAN) 0.005 % ophthalmic solution Place 1 drop into both eyes daily. 05/06/19  Yes [provider]  ?Magnesium 250 MG TABS Take 250 mg by mouth 2 (two) times daily. Breakfast and lunch   Yes [provider]  ?minoxidil (LONITEN) 2.5 MG tablet Take  1 to 2 tablets  every Morning  for BP ?Patient taking differently: Take 2.5-5 mg by mouth daily. 08/24/20  Yes Unk Pinto, MD  ?Multiple Vitamin (MULTIVITAMIN) tablet Take 1 tablet by mouth daily.   Yes [provider]  ?Omega-3 Fatty Acids (OMEGA-3  FISH OIL) 1200 MG CAPS Take 1 capsule by mouth daily with lunch.   Yes [provider]  ?omeprazole (PRILOSEC) 20 MG capsule TAKE 1 CAPSULE BY MOUTH DAILY 06/21/21  Yes Magda Bernheim, NP  ?zinc gluconate 50 MG tablet Take 50 mg by mouth daily.   Yes [provider]  ?blood glucose meter kit and supplies Dispense based insurance preference. E11.9 07/05/17   Unk Pinto, MD  ?glucose blood (ONE TOUCH ULTRA TEST) test strip USE TO CHECK BLOOD SUGAR ONCE DAILY 07/05/17   Unk Pinto, MD  ?glucose blood [facility] ULTRA) test strip Check   Blood Sugar   Daily  (Dx: e11.9) 08/24/20   Unk Pinto, MD  ?glucose blood test strip Check blood sugar once daily 09/26/16   Vladimir Crofts, PA-C  ?OneTouch Delica Lancets 54G MISC Check blood sugar 1 time a day. 09/20/18   Unk Pinto, MD  ? ? ?Physical Exam: ?Vitals:  ? 08/12/21 0827 08/12/21 0830 08/12/21 0900 08/12/21 0930  ?BP:  125/62 125/61 (!) 152/66  ?Pulse:  (!) 52 (!) 52 78  ?Resp:  17 14 (!) 21  ?Temp:      ?SpO2:  96% 95% 97%  ?Weight: 88.9 kg     ?Height: 6' 3"  (1.905 m)     ? ?Physical Exam ?Vitals and nursing note reviewed.  ?Constitutional:   ?   Appearance: Normal appearance. He is normal weight.  ?HENT:  ?   Head: Normocephalic.  ?   Mouth/Throat:  ?   Mouth: Mucous membranes are moist.  ?Eyes:  ?   General: No scleral icterus. ?   Pupils: Pupils are  equal, round, and reactive to light.  ?Neck:  ?   Vascular: No carotid bruit.  ?Cardiovascular:  ?   Rate and Rhythm: Normal rate and regular rhythm.  ?   Heart sounds: S1 normal and S2 normal.  ?Pulmonary:  ?

## 2021-08-12 NOTE — ED Notes (Signed)
Pt said he cannot urinate because he hasn't had anything to drink ?

## 2021-08-12 NOTE — ED Provider Notes (Signed)
?[facility] DEPT ?Provider Note ? ? ?CSN: [id number] ?Arrival date & time: 08/12/21  0756 ? ?  ? ?History ? ?Chief Complaint  ?Patient presents with  ? Loss of Consciousness  ? ? ?MAJD TISSUE is a 81 y.o. male. ? ?HPI ?81 yo male presents complaining of syncope- patient states he had indigestion this am, pressure upper abdomen, ate breakfast with honey, and states that his stomach settled down with eating.  Went into other room and reading on computer, felt like his eyes were blurry and next thing he remembers his wife found slumped in chair.  Wife found him with head back in chair, and she thought he was dead.  NO movement, lasted 4-5 minutes.  She called 911 and patient became alert.  No loss of bowel or bladder control.  After he awoke he vomited several times- food no blood.  Patient states he received something for nausea by ems and nausea better, mild epigastric discomfort now- was worse ? ?No similar in past ?PMD- Dr. Melford Aase ?Dr. Jeffie Pollock for kidney stones ?NO cardiologist ?States 6-7 years ago at [facility] for similar but no syncope, states heart was beating fast. ? ? ?  ? ?Home Medications ?Prior to Admission medications   ?Medication Sig Start Date End Date Taking? Authorizing Provider  ?Alfalfa 650 MG TABS Take 2 tablets by mouth daily.    [provider]  ?allopurinol (ZYLOPRIM) 300 MG tablet TAKE 1 TABLET BY MOUTH DAILY FOR GOUT 12/12/20   Magda Bernheim, NP  ?aspirin 81 MG tablet Take 81 mg by mouth daily.    [provider]  ?atorvastatin (LIPITOR) 10 MG tablet TAKE 1 TABLET BY MOUTH DAILY FOR CHOLESTEROL 06/28/21   Magda Bernheim, NP  ?bisoprolol-hydrochlorothiazide [facility]) 2.5-6.25 MG tablet TAKE 1 TABLET BY MOUTH DAILY FOR BLOOD PRESSURE 08/20/20   Unk Pinto, MD  ?blood glucose meter kit and supplies Dispense based insurance preference. E11.9 07/05/17   Unk Pinto, MD  ?Cholecalciferol (VITAMIN D3) 2000 units TABS Take 3 tablets by mouth daily.     [provider]  ?Cinnamon 500 MG TABS Take 2 tablets by mouth daily.    [provider]  ?doxazosin (CARDURA) 8 MG tablet Take 1/2 tablet at Bedtime for BP & Prostate 08/20/20   Unk Pinto, MD  ?enalapril (VASOTEC) 20 MG tablet TAKE 1 TABLET BY MOUTH TWICE DAILY FOR BLOOD PRESSURE 11/24/20   Magda Bernheim, NP  ?finasteride (PROSCAR) 5 MG tablet Take 5 mg by mouth every evening.     [provider]  ?Garlic 8127 MG CAPS Take 1,000 mg by mouth daily.    [provider]  ?glucose blood (ONE TOUCH ULTRA TEST) test strip USE TO CHECK BLOOD SUGAR ONCE DAILY 07/05/17   Unk Pinto, MD  ?glucose blood (ONETOUCH ULTRA) test strip Check   Blood Sugar   Daily  (Dx: e11.9) 08/24/20   Unk Pinto, MD  ?glucose blood test strip Check blood sugar once daily 09/26/16   Vladimir Crofts, PA-C  ?latanoprost (XALATAN) 0.005 % ophthalmic solution Place 1 drop into both eyes daily. 05/06/19   [provider]  ?Magnesium 250 MG TABS Take 250 mg by mouth 2 (two) times daily. Breakfast and lunch    [provider]  ?minoxidil (LONITEN) 2.5 MG tablet Take  1 to 2 tablets  every Morning  for BP 08/24/20   Unk Pinto, MD  ?Multiple Vitamin (MULTIVITAMIN) tablet Take 1 tablet by mouth daily.  [provider]  ?Omega-3 Fatty Acids (OMEGA-3 FISH OIL) 1200 MG CAPS Take 1 capsule by mouth daily with lunch.    [provider]  ?omeprazole (PRILOSEC) 20 MG capsule TAKE 1 CAPSULE BY MOUTH DAILY 06/21/21   Magda Bernheim, NP  ?OneTouch Delica Lancets 63J MISC Check blood sugar 1 time a day. 09/20/18   Unk Pinto, MD  ?   ? ?Allergies    ?Adhesive [tape]   ? ?Review of Systems   ?Review of Systems ? ?Physical Exam ?Updated Vital Signs ?BP (!) 152/66   Pulse 78   Temp 97.6 ?F (36.4 ?C)   Resp (!) 21   Ht 1.905 m (_0 )   Wt 88.9 kg   SpO2 97%   BMI 24.50 kg/m?  ?Physical Exam ? ?ED Results / Procedures / Treatments   ?Labs ?(all labs ordered are listed, but  only abnormal results are displayed) ?Labs Reviewed  ?BASIC METABOLIC PANEL - Abnormal; Notable for the following components:  ?    Result Value  ? Sodium 134 (*)   ? Glucose, Bld 168 (*)   ? Calcium 8.5 (*)   ? All other components within normal limits  ?CBG MONITORING, ED - Abnormal; Notable for the following components:  ? Glucose-Capillary 165 (*)   ? All other components within normal limits  ?CBC  ?URINALYSIS, ROUTINE W REFLEX MICROSCOPIC  ?TROPONIN I (HIGH SENSITIVITY)  ? ? ?EKG ?EKG Interpretation ? ?Date/Time:  Thursday August 12 2021 08:15:06 EDT ?Ventricular Rate:  57 ?PR Interval:  208 ?QRS Duration: 102 ?QT Interval:  428 ?QTC Calculation: 417 ?R Axis:   -2 ?Text Interpretation: Sinus rhythm Normal ECG No significant change since last tracing 2014 Confirmed by Pattricia Boss [phone]) on 08/12/2021 9:18:48 AM ? ?Radiology ?No results found. ? ?Procedures ?Procedures  ? ? ?Medications Ordered in ED ?Medications  ?lactated ringers bolus 500 mL (500 mLs Intravenous New Bag/Given 08/12/21 1016)  ? ? ?ED Course/ Medical Decision Making/ A&P ?  ?                        ?Medical Decision Making ?This is an 81 year old man with history of hypertension, hyperlipidemia, on blood pressure medicines which include beta-blocker who presents today with a sewed of syncope.  He had some epigastric discomfort that preceded this.  He feels that this is secondary to GERD which she has had since young adulthood.  However was more pronounced this morning and patient then had syncopal episode.  Syncopal episode was followed by nausea and vomiting.  This resolved prehospital.  There is a prehospital strip that shows a rate of approximately 40.  Heart rate has improved here.  The syncopal episode lasted 45 minutes.  There is no evidence of seizure.  He was awakened immediately after the event. ?Differential diagnosis includes syncope secondary to cardiac arrhythmia, volume depletion, stroke and seizure.  I doubt stroke or seizure  given that the patient has no focal neurological deficits, he had no shaking and no postictal state noted.  No evidence of hypoglycemia.  EKG here does not show any evidence of ischemia and rate has now normal.  However, as a hospitalist report was lower.  His first troponin is 13.  This will need to be trended. ? ?Amount and/or Complexity of Data Reviewed ?Independent Historian: spouse ?External Data Reviewed:  ?   Details: Prehospital strip-40s. Now 55 ?Labs: ordered. ?ECG/medicine tests: ordered. Decision-making details documented in ED Course. ?  Details: I have reviewed monitor and heart rate in the 60s. ?Discussion of management or test interpretation with external provider(s): Discussed with Dr. Olevia Bowens who will see for admission. ? ?Risk ?Decision regarding hospitalization. ? ? ? ? ? ? ? ? ? ? ?Final Clinical Impression(s) / ED Diagnoses ?Final diagnoses:  ?Syncope and collapse  ? ? ?Rx / DC Orders ?ED Discharge Orders   ? ? None  ? ?  ? ? ?  ?Pattricia Boss, MD ?08/12/21 1025 ? ?

## 2021-08-12 NOTE — Progress Notes (Signed)
? ?  Echocardiogram ?2D Echocardiogram has been performed. ? ?Michael Waller ?08/12/2021, 2:00 PM ?

## 2021-08-13 ENCOUNTER — Ambulatory Visit (INDEPENDENT_AMBULATORY_CARE_PROVIDER_SITE_OTHER): Payer: PPO

## 2021-08-13 ENCOUNTER — Other Ambulatory Visit: Payer: Self-pay | Admitting: Physician Assistant

## 2021-08-13 DIAGNOSIS — M1000 Idiopathic gout, unspecified site: Secondary | ICD-10-CM | POA: Diagnosis not present

## 2021-08-13 DIAGNOSIS — R55 Syncope and collapse: Secondary | ICD-10-CM

## 2021-08-13 DIAGNOSIS — I10 Essential (primary) hypertension: Secondary | ICD-10-CM

## 2021-08-13 DIAGNOSIS — N401 Enlarged prostate with lower urinary tract symptoms: Secondary | ICD-10-CM

## 2021-08-13 LAB — GLUCOSE, CAPILLARY
Glucose-Capillary: 102 mg/dL — ABNORMAL HIGH (ref 70–99)
Glucose-Capillary: 102 mg/dL — ABNORMAL HIGH (ref 70–99)

## 2021-08-13 NOTE — Progress Notes (Signed)
[facility] ?PROGRESS NOTE ? ? ? ?Progress Note  ?TED GOODNER  UYQ:[id number] DOB: February 19, 1941 DOA: 08/12/2021 ?PCP: Unk Pinto, MD  ? ? ? ?Brief Narrative:  ? ?VERLAN GROTZ is an 81 y.o. male past medical history significant for BPH, gout, essential hypertension comes into the emergency room after a syncopal episode followed by a 4 to 5 minutes loss of consciousness at home where he relates he was in front of the computer when he passed out his wife relates he was unconscious for 5 minutes.  A few months ago his beta-blocker was decreased due to lightheadedness. ? ? ? ?Assessment/Plan:  ? ?Syncope and collapse ?Question vasovagal, chest x-ray showed no acute cardiopulmonary disease. ?2D echo showed an EF of 60% no wall motion abnormalities no valve abnormalities. ?Carotid Doppler showed no significant ICA stenosis bilaterally less than 39% ?Cardiac biomarkers were basically flat. ?No events on telemetry, has remained sinus rhythm, heart rate 65-70. ?Will probably need a Holter monitor as an outpatient.  Awaiting cardiology further recommendations. ? ?Essential hypertension ?Continue enalapril holding bisoprolol and hydrochlorothiazide. ?Pressures well controlled. ? ?Mixed hyperlipidemia ?Continue statins. ? ?Prediabetes ?A1c of 5.6 continue diet. ? ?Vitamin D deficiency ?Continue vitamin supplementation follow-up with PCP as an outpatient. ? ?Benign prostatic hyperplasia ?Continue doxazosin and finasteride. ? ?Idiopathic gout ?Continue allopurinol ? ?Urolithiasis ?Follow-up with urology as an outpatient. ? ? ? ?DVT prophylaxis: lovenox ?Family Communication:none ?Status is: Observation ?The patient remains OBS appropriate and will d/c before 2 midnights. ? ? ? ?Code Status:  ? ?  ?Code Status Orders  ?(From admission, onward)  ?  ? ? ?  ? ?  Start     Ordered  ? 08/12/21 1112  Full code  Continuous       ? 08/12/21 1112  ? ?  ?  ? ?  ? ?Code Status History   ? ? Date Active Date Inactive Code Status  Order ID Comments User Context  ? 08/12/2021 1113 08/12/2021 1113 Full Code [id number]  Reubin Milan, MD ED  ? ?  ? ? ? ? ?IV Access:  ? ?Peripheral IV ? ? ?Procedures and diagnostic studies:  ? ?X-ray chest PA and lateral ? ?Result Date: 08/12/2021 ?CLINICAL DATA:  Syncope EXAM: CHEST - 2 VIEW COMPARISON:  05/21/2013 . FINDINGS: The heart size and mediastinal contours are within normal limits. Aortic atherosclerosis. Both lungs are clear. The visualized skeletal structures are unremarkable. IMPRESSION: No active cardiopulmonary disease. Electronically Signed   By: Davina Poke D.O.   On: 08/12/2021 12:15  ? ?ECHOCARDIOGRAM COMPLETE ? ?Result Date: 08/12/2021 ?   ECHOCARDIOGRAM REPORT   Patient Name:   DEMAREON COLDWELL Date of Exam: 08/12/2021 Medical Rec #:  [id number]       Height:       75.0 in Accession #:    [id number]      Weight:       196.0 lb Date of Birth:  05/22/1941       BSA:          2.176 m? Patient Age:    23 years        BP:           137/69 mmHg Patient Gender: M               HR:           71 bpm. Exam Location:  Inpatient Procedure: 2D Echo, Cardiac Doppler and Color Doppler Indications:    R55  SYNCOPE  History:        Patient has no prior history of Echocardiogram examinations.                 Risk Factors:Diabetes, Hypertension and Dyslipidemia.  Sonographer:    Beryle Beams Referring Phys: [id number] DAVID MANUEL Howell  1. Left ventricular ejection fraction, by estimation, is 60 to 65%. The left ventricle has normal function. The left ventricle has no regional wall motion abnormalities. Left ventricular diastolic parameters are indeterminate.  2. Right ventricular systolic function is normal. The right ventricular size is normal. There is normal pulmonary artery systolic pressure.  3. Left atrial size was mildly dilated.  4. The mitral valve is normal in structure. No evidence of mitral valve regurgitation. No evidence of mitral stenosis.  5. The aortic valve is tricuspid. Aortic  valve regurgitation is not visualized. No aortic stenosis is present.  6. The inferior vena cava is normal in size with greater than 50% respiratory variability, suggesting right atrial pressure of 3 mmHg. FINDINGS  Left Ventricle: Left ventricular ejection fraction, by estimation, is 60 to 65%. The left ventricle has normal function. The left ventricle has no regional wall motion abnormalities. The left ventricular internal cavity size was normal in size. There is  no left ventricular hypertrophy. Left ventricular diastolic parameters are indeterminate. Right Ventricle: The right ventricular size is normal. No increase in right ventricular wall thickness. Right ventricular systolic function is normal. There is normal pulmonary artery systolic pressure. The tricuspid regurgitant velocity is 1.52 m/s, and  with an assumed right atrial pressure of 3 mmHg, the estimated right ventricular systolic pressure is 81.8 mmHg. Left Atrium: Left atrial size was mildly dilated. Right Atrium: Right atrial size was normal in size. Pericardium: There is no evidence of pericardial effusion. Mitral Valve: The mitral valve is normal in structure. No evidence of mitral valve regurgitation. No evidence of mitral valve stenosis. Tricuspid Valve: The tricuspid valve is normal in structure. Tricuspid valve regurgitation is trivial. No evidence of tricuspid stenosis. Aortic Valve: The aortic valve is tricuspid. Aortic valve regurgitation is not visualized. No aortic stenosis is present. Aortic valve mean gradient measures 5.0 mmHg. Aortic valve peak gradient measures 9.4 mmHg. Aortic valve area, by VTI measures 2.82 cm?. Pulmonic Valve: The pulmonic valve was normal in structure. Pulmonic valve regurgitation is not visualized. No evidence of pulmonic stenosis. Aorta: The aortic root is normal in size and structure. Venous: The inferior vena cava is normal in size with greater than 50% respiratory variability, suggesting right atrial  pressure of 3 mmHg. IAS/Shunts: No atrial level shunt detected by color flow Doppler.  LEFT VENTRICLE PLAX 2D LVIDd:         5.20 cm     Diastology LVIDs:         2.70 cm     LV e' medial:    9.46 cm/s LV PW:         1.00 cm     LV E/e' medial:  9.6 LV IVS:        0.90 cm     LV e' lateral:   12.70 cm/s LVOT diam:     2.20 cm     LV E/e' lateral: 7.1 LV SV:         89 LV SV Index:   41 LVOT Area:     3.80 cm?  LV Volumes (MOD) LV vol d, MOD A2C: 37.8 ml LV vol d, MOD A4C: 80.8 ml LV vol  s, MOD A2C: 10.7 ml LV vol s, MOD A4C: 16.8 ml LV SV MOD A2C:     27.1 ml LV SV MOD A4C:     80.8 ml LV SV MOD BP:      45.9 ml RIGHT VENTRICLE RV S prime:     18.40 cm/s TAPSE (M-mode): 2.6 cm LEFT ATRIUM             Index        RIGHT ATRIUM           Index LA diam:        4.30 cm 1.98 cm/m?   RA Area:     21.10 cm? LA Vol (A2C):   76.9 ml 35.35 ml/m?  RA Volume:   51.50 ml  23.67 ml/m? LA Vol (A4C):   46.6 ml 21.42 ml/m? LA Biplane Vol: 63.9 ml 29.37 ml/m?  AORTIC VALVE                     PULMONIC VALVE AV Area (Vmax):    2.63 cm?      PV Vmax:        0.92 m/s AV Area (Vmean):   2.47 cm?      PV Vmean:       54.100 cm/s AV Area (VTI):     2.82 cm?      PV VTI:         0.179 m AV Vmax:           153.50 cm/s   PV Peak grad:   3.4 mmHg AV Vmean:          103.850 cm/s  PV Mean grad:   1.0 mmHg AV VTI:            0.316 m       RVOT Peak grad: 6 mmHg AV Peak Grad:      9.4 mmHg AV Mean Grad:      5.0 mmHg LVOT Vmax:         106.00 cm/s LVOT Vmean:        67.600 cm/s LVOT VTI:          0.234 m LVOT/AV VTI ratio: 0.74  AORTA Ao Root diam: 3.30 cm Ao Asc diam:  3.50 cm MITRAL VALVE               TRICUSPID VALVE MV Area (PHT): 3.91 cm?    TR Peak grad:   9.2 mmHg MV Decel Time: 194 msec    TR Vmax:        152.00 cm/s MV E velocity: 90.80 cm/s MV A velocity: 79.30 cm/s  SHUNTS MV E/A ratio:  1.15        Systemic VTI:  0.23 m                            Systemic Diam: 2.20 cm                            Pulmonic VTI:  0.267 m Skeet Latch  MD Electronically signed by Skeet Latch MD Signature Date/Time: 08/12/2021/6:38:55 PM    Final   ? ?VAS US CAROTID ? ?Result Date: 08/12/2021 ?Carotid Arterial Duplex Study Patient Name:  DREON PINEDA

## 2021-08-13 NOTE — Discharge Summary (Signed)
Physician Discharge Summary  ?TAL KEMPKER XLK:[id number] DOB: 1940/06/07 DOA: 08/12/2021 ? ?PCP: Unk Pinto, MD ? ?Admit date: 08/12/2021 ?Discharge date: 08/13/2021 ? ?Admitted From: Home ?Disposition:  Home ? ?Recommendations for Outpatient Follow-up:  ?Follow up with PCP in 1-2 weeks ?Please obtain BMP/CBC in one week ? ? ?Home Health:No ?Equipment/Devices:None ? ?Discharge Condition:Stable ?CODE STATUS:Full ?Diet recommendation: Heart Healthy  ? ?Brief/Interim Summary: ?81 y.o. male past medical history significant for BPH, gout, essential hypertension comes into the emergency room after a syncopal episode followed by a 4 to 5 minutes loss of consciousness at home where he relates he was in front of the computer when he passed out his wife relates he was unconscious for 5 minutes.  A few months ago his beta-blocker was decreased due to lightheadedness. ? ?Discharge Diagnoses:  ?Principal Problem: ?  Syncope and collapse ?Active Problems: ?  Essential hypertension ?  Mixed hyperlipidemia ?  Prediabetes ?  Vitamin D deficiency ?  Benign prostatic hyperplasia ?  Idiopathic gout ?  Gastroesophageal reflux disease without esophagitis ?  Urolithiasis ? ?Syncope and collapse: ?Question vasovagal, chest x-ray showed no acute cardiopulmonary disease. ?2D echo showed no wall motion abnormalities EF of 60%. ?Carotid Doppler showed no significant ICA stenosis. ?Cardiac biomarkers basically remained flat. ?He had no events on telemetry remained sinus rhythm 65-70. ?Cardiology was consulted recommended a 30-day monitor as an outpatient. ? ?Essential hypertension: ?No change made to his medication continue current regimen. ? ?Mixed lipidemia: ?Continue statins. ? ?Prediabetes mellitus: ?Continue diet control. ? ?Vitamin D deficiency: ?Noted. ? ?BPH: ?Continue doxazosin and finasteride. ? ?Idiopathic gout: ?Continue allopurinol. ? ?Discharge Instructions ? ?Discharge Instructions   ? ? Diet - low sodium heart healthy    Complete by: As directed ?  ? Increase activity slowly   Complete by: As directed ?  ? ?  ? ?Allergies as of 08/13/2021   ? ?   Reactions  ? Adhesive [tape] Other (See Comments)  ? reddness  ? ?  ? ?  ?Medication List  ?  ? ?TAKE these medications   ? ?acetaminophen 500 MG tablet ?Commonly known as: TYLENOL ?Take 1,000 mg by mouth every 6 (six) hours as needed for mild pain. ?  ?Alfalfa 500 MG Tabs ?Take 1,000 mg by mouth daily. ?  ?allopurinol 300 MG tablet ?Commonly known as: ZYLOPRIM ?TAKE 1 TABLET BY MOUTH DAILY FOR GOUT ?What changed:  ?how much to take ?how to take this ?when to take this ?additional instructions ?  ?aspirin 81 MG tablet ?Take 81 mg by mouth daily. ?  ?atorvastatin 10 MG tablet ?Commonly known as: LIPITOR ?TAKE 1 TABLET BY MOUTH DAILY FOR CHOLESTEROL ?What changed:  ?how much to take ?how to take this ?when to take this ?additional instructions ?  ?bisoprolol-hydrochlorothiazide 2.5-6.25 MG tablet ?Commonly known as: ZIAC ?TAKE 1 TABLET BY MOUTH DAILY FOR BLOOD PRESSURE ?What changed:  ?how much to take ?how to take this ?when to take this ?additional instructions ?  ?blood glucose meter kit and supplies ?Dispense based insurance preference. E11.9 ?  ?cetirizine 10 MG tablet ?Commonly known as: ZYRTEC ?Take 10 mg by mouth daily as needed for allergies. ?  ?Cinnamon 500 MG Tabs ?Take 1,000 mg by mouth daily. ?  ?doxazosin 8 MG tablet ?Commonly known as: CARDURA ?Take 1/2 tablet at Bedtime for BP & Prostate ?What changed:  ?how much to take ?how to take this ?when to take this ?  ?enalapril 20 MG tablet ?Commonly known as: VASOTEC ?  TAKE 1 TABLET BY MOUTH TWICE DAILY FOR BLOOD PRESSURE ?What changed:  ?how much to take ?how to take this ?when to take this ?additional instructions ?  ?finasteride 5 MG tablet ?Commonly known as: PROSCAR ?Take 5 mg by mouth every evening. ?  ?Garlic 3785 MG Caps ?Take 1,000 mg by mouth daily. ?  ?glucose blood test strip ?Check blood sugar once daily ?  ?glucose  blood test strip ?Commonly known as: ONE TOUCH ULTRA TEST ?USE TO CHECK BLOOD SUGAR ONCE DAILY ?  ?OneTouch Ultra test strip ?Generic drug: glucose blood ?Check   Blood Sugar   Daily  (Dx: e11.9) ?  ?latanoprost 0.005 % ophthalmic solution ?Commonly known as: XALATAN ?Place 1 drop into both eyes daily. ?  ?Magnesium 250 MG Tabs ?Take 250 mg by mouth 2 (two) times daily. Breakfast and lunch ?  ?minoxidil 2.5 MG tablet ?Commonly known as: LONITEN ?Take  1 to 2 tablets  every Morning  for BP ?What changed:  ?how much to take ?how to take this ?when to take this ?additional instructions ?  ?multivitamin tablet ?Take 1 tablet by mouth daily. ?  ?Omega-3 Fish Oil 1200 MG Caps ?Take 1,200 mg by mouth daily with lunch. ?  ?omeprazole 20 MG capsule ?Commonly known as: PRILOSEC ?TAKE 1 CAPSULE BY MOUTH DAILY ?What changed: how to take this ?  ?OneTouch Delica Lancets 88F Misc ?Check blood sugar 1 time a day. ?  ?Vitamin D3 50 MCG (2000 UT) Tabs ?Take 6,000 Units by mouth daily. ?  ?zinc gluconate 50 MG tablet ?Take 50 mg by mouth daily. ?  ? ?  ? ? ?Allergies  ?Allergen Reactions  ? Adhesive [Tape] Other (See Comments)  ?  reddness  ? ? ?Consultations: ?Cardiology ? ? ?Procedures/Studies: ?X-ray chest PA and lateral ? ?Result Date: 08/12/2021 ?CLINICAL DATA:  Syncope EXAM: CHEST - 2 VIEW COMPARISON:  05/21/2013 . FINDINGS: The heart size and mediastinal contours are within normal limits. Aortic atherosclerosis. Both lungs are clear. The visualized skeletal structures are unremarkable. IMPRESSION: No active cardiopulmonary disease. Electronically Signed   By: Michael Waller D.O.   On: 08/12/2021 12:15  ? ?ECHOCARDIOGRAM COMPLETE ? ?Result Date: 08/12/2021 ?   ECHOCARDIOGRAM REPORT   Patient Name:   Michael Waller Date of Exam: 08/12/2021 Medical Rec #:  [id number]       Height:       75.0 in Accession #:    [id number]      Weight:       196.0 lb Date of Birth:  July 29, 1940       BSA:          2.176 m? Patient Age:    57 years         BP:           137/69 mmHg Patient Gender: M               HR:           71 bpm. Exam Location:  Inpatient Procedure: 2D Echo, Cardiac Doppler and Color Doppler Indications:    R55 SYNCOPE  History:        Patient has no prior history of Echocardiogram examinations.                 Risk Factors:Diabetes, Hypertension and Dyslipidemia.  Sonographer:    Beryle Beams Referring Phys: [id number] Michael Waller  1. Left ventricular ejection fraction, by estimation, is 60 to 65%. The left ventricle  has normal function. The left ventricle has no regional wall motion abnormalities. Left ventricular diastolic parameters are indeterminate.  2. Right ventricular systolic function is normal. The right ventricular size is normal. There is normal pulmonary artery systolic pressure.  3. Left atrial size was mildly dilated.  4. The mitral valve is normal in structure. No evidence of mitral valve regurgitation. No evidence of mitral stenosis.  5. The aortic valve is tricuspid. Aortic valve regurgitation is not visualized. No aortic stenosis is present.  6. The inferior vena cava is normal in size with greater than 50% respiratory variability, suggesting right atrial pressure of 3 mmHg. FINDINGS  Left Ventricle: Left ventricular ejection fraction, by estimation, is 60 to 65%. The left ventricle has normal function. The left ventricle has no regional wall motion abnormalities. The left ventricular internal cavity size was normal in size. There is  no left ventricular hypertrophy. Left ventricular diastolic parameters are indeterminate. Right Ventricle: The right ventricular size is normal. No increase in right ventricular wall thickness. Right ventricular systolic function is normal. There is normal pulmonary artery systolic pressure. The tricuspid regurgitant velocity is 1.52 m/s, and  with an assumed right atrial pressure of 3 mmHg, the estimated right ventricular systolic pressure is 47.5 mmHg. Left Atrium: Left  atrial size was mildly dilated. Right Atrium: Right atrial size was normal in size. Pericardium: There is no evidence of pericardial effusion. Mitral Valve: The mitral valve is normal in structure. No evidence

## 2021-08-13 NOTE — Progress Notes (Signed)
AVS and discharge instructions reviewed w/ patient and wife at the bedside. Patient and wife verbalized understanding and had no further questions.  

## 2021-08-13 NOTE — Progress Notes (Unsigned)
Enrolled patient for a 14 day Zio AT monitor to be mailed to patients home.  

## 2021-08-13 NOTE — Progress Notes (Signed)
Reviewed telemetry overnight, no significant pauses or arrhythmia to explain the passing out spell. Echocardiogram normal. Discussed with the patient those result. We plan to arrange 2 week Zio monitor as outpatient and follow up with Dr. Margaretann Loveless as outpatient.  ?

## 2021-08-13 NOTE — Plan of Care (Signed)

## 2021-08-16 DIAGNOSIS — R55 Syncope and collapse: Secondary | ICD-10-CM

## 2021-08-17 DIAGNOSIS — R55 Syncope and collapse: Secondary | ICD-10-CM | POA: Diagnosis not present

## 2021-08-21 ENCOUNTER — Other Ambulatory Visit: Payer: Self-pay | Admitting: Internal Medicine

## 2021-08-21 DIAGNOSIS — I10 Essential (primary) hypertension: Secondary | ICD-10-CM

## 2021-08-23 ENCOUNTER — Other Ambulatory Visit: Payer: Self-pay | Admitting: Internal Medicine

## 2021-08-25 ENCOUNTER — Telehealth: Payer: Self-pay

## 2021-08-25 NOTE — Telephone Encounter (Signed)
Prior auth approved for bisoprolol 2.'5mg'$ -hydrochlorothiazide 6.'25mg'$  tablet through 08/26/22 ?

## 2021-09-02 DIAGNOSIS — L814 Other melanin hyperpigmentation: Secondary | ICD-10-CM | POA: Diagnosis not present

## 2021-09-02 DIAGNOSIS — L578 Other skin changes due to chronic exposure to nonionizing radiation: Secondary | ICD-10-CM | POA: Diagnosis not present

## 2021-09-02 DIAGNOSIS — Z85828 Personal history of other malignant neoplasm of skin: Secondary | ICD-10-CM | POA: Diagnosis not present

## 2021-09-02 DIAGNOSIS — L821 Other seborrheic keratosis: Secondary | ICD-10-CM | POA: Diagnosis not present

## 2021-09-02 DIAGNOSIS — L570 Actinic keratosis: Secondary | ICD-10-CM | POA: Diagnosis not present

## 2021-09-02 DIAGNOSIS — D225 Melanocytic nevi of trunk: Secondary | ICD-10-CM | POA: Diagnosis not present

## 2021-09-02 NOTE — Progress Notes (Signed)
?Future Appointments  ?Date Time Provider Department  ?09/03/2021 11:00 AM Unk Pinto, MD [facility]  ?09/13/2021  9:40 AM Elouise Munroe, MD [facility]  ?12/01/2021        Wellness 11:00 AM Liane Comber, NP [facility]  ? ? ?[facility] Follow-Up ? ?   This very nice 81 y.o. [name] was admitted to the hospital on  08/12/2021  and patient was discharged from the hospital on  08/14/2022. The patient now presents for follow up for transition from recent hospitalization.  The discharge summary, medications and diagnostic test results were reviewed before meeting with the patient. The patient was admitted for:  ? ?Syncope and collapse ?Essential hypertension ?Hyperlipidemia, mixed ?Abnormal glucose ?Prediabetes ?Vitamin D deficiency ?Benign prostatic hyperplasia  ?Idiopathic gout ?Gastroesophageal reflux disease ? ? ?    The patient is a very nice 81 yo [name] with hx/o  HTN, HLD, Prediabetes,  Gout ,Obs BPH with LUTS, GERD  &Vitamin D Deficiency who was admitted to the hospital following an episode of syncope with approx 5 minutes of unresponsiveness. Work -up in hospital included Negative CXR, 2-D echo cardiogram, Negative Carotid Dopplers, Negative cardiac enzymes,  and telemetry monitoring remained NSR. Patient had cardiology consultation & was discharged for out-patient cardiac arrhythmia monitoring. No changes were made to medicine regimen.  ? ? ?   Patient is also followed with Hypertension, Hyperlipidemia, Pre-Diabetes and Vitamin D Deficiency.  ? ?   Patient is treated for HTN (1986) & BP has been controlled at home. Today?s BP is at goal - 138/68. Patient has had no complaints of any cardiac type chest pain, palpitations, dyspnea /orthopnea / PND, dizziness, claudication, or dependent edema. ? ?   Hyperlipidemia is controlled with diet & Atorvastatin. Patient denies myalgias or other med SE?s. Last Lipids were at goal : ? ?Lab Results  ?Component Value Date  ? CHOL 174 03/31/2021  ? HDL 55 03/31/2021   ? LDLCALC 101 (H) 03/31/2021  ? TRIG 87 03/31/2021  ? CHOLHDL 3.2 03/31/2021  ? ?   Also, the patient has history of PreDiabetes (A1c 5.8% /2006) and has had no symptoms of reactive hypoglycemia, diabetic polys, paresthesias or visual blurring.  Last A1c was at goal : ? ?Lab Results  ?Component Value Date  ? HGBA1C 5.6 08/12/2021  ? ?   Further, the patient also has history of Vitamin D Deficiency ("49"on tx /2008) and supplements vitamin D without any suspected side-effects. Last vitamin D was at goal : ? ?Lab Results  ?Component Value Date  ? VD25OH 86 09/02/2020  ? ?Current Outpatient Medications on File Prior to Visit  ?Medication Sig  ? acetaminophen 500 MG tablet Take 1,000 mg every 6  hours as needed   ? Alfalfa 500 MG TABS Take 1,000 mg  daily.  ? allopurinol  300 MG tablet Takes  150 mg  daily  ? aspirin 81 MG tablet Take 81 mg by mouth daily.  ? atorvastatin  10 MG tablet TAKE 1 TABLET DAILY   ? bisoprolol-hctz  2.5-6.25 MG tablet TAKE 1 TABLET  DAILY   ? cetirizine  10 MG tablet Take daily as needed   ? VITAMIN D   2000 units  Take 6,000 Units  daily.  ? Cinnamon 500 MG TABS Take 1,000 mg daily.  ? doxazosin 8 MG tablet TAKE 1/2 TABLET  AT BEDTIME  ? enalapril  20 MG tablet TAKE 1 TABLET  TWICE DAILY   ? finasteride  5 MG tablet Take every evening.   ?  Garlic 6720 MG CAPS Take  daily.  ? XALATAN ophth soln Place 1 drop into both eyes daily.  ? Magnesium 250 MG TABS Take  2 times daily  ? minoxidil  2.5 MG tablet TAKE 1-2 TABLETS EVERY MORNING   ? Multiple Vitamin  Take 1 tablet daily.  ? Omega-3 FISH OIL 1200 MG  Take  daily   ? omeprazole 20 MG capsule TAKE 1 CAPSULE DAILY   ? zinc 50 MG tablet Take  daily.  ? ? ? ?Allergies  ?Allergen Reactions  ? Adhesive [Tape] Other (See Comments)  ?  reddness  ? ?PMHx:   ?Past Medical History:  ?Diagnosis Date  ? Benign localized prostatic hyperplasia with lower urinary tract symptoms (LUTS)   ? Bruises easily   ? Elevated PSA   ? GERD (gastroesophageal reflux  disease)   ? History of gout   ? History of kidney stones   ? Hyperlipidemia   ? Hypertension   ? Nephrolithiasis   ? Nocturia   ? Pre-diabetes   ? Vitamin D deficiency   ? Wears dentures   ? full upper and lower partial   ? ?Immunization History  ?Administered Date(s) Administered  ? DTaP 08/31/2005  ? PFIZER(Purple Top)SARS-COV-2 Vaccination 06/14/2019, 07/04/2019  ? PPD Test 03/17/2014  ? Td 07/26/2017  ? ?Past Surgical History:  ?Procedure Laterality Date  ? COLONOSCOPY  2008  ? CYSTOSCOPY WITH RETROGRADE PYELOGRAM, URETEROSCOPY AND STENT PLACEMENT Bilateral 04/19/2016  ? Procedure: CYSTOSCOPY WITH BILATERAL RETROGRADE URETEROSCOPY BASKET EXTRACTION;  Surgeon: Irine Seal, MD;  Location: [facility];  Service: Urology;  Laterality: Bilateral;  ? EXTRACORPOREAL SHOCK WAVE LITHOTRIPSY  [phone]  ? HAND LIGAMENT RECONSTRUCTION Right 2009  ? PROSTATE BIOPSY  2000, 12/2004  ? negative  ? ?FHx:    Reviewed / unchanged ? ?SHx:    Reviewed / unchanged  ?Systems Review: ? ?Constitutional: Denies fever, chills, wt changes, headaches, insomnia, fatigue, night sweats, change in appetite. ?Eyes: Denies redness, blurred vision, diplopia, discharge, itchy, watery eyes.  ?ENT: Denies discharge, congestion, post nasal drip, epistaxis, sore throat, earache, hearing loss, dental pain, tinnitus, vertigo, sinus pain, snoring.  ?CV: Denies chest pain, palpitations, irregular heartbeat, syncope, dyspnea, diaphoresis, orthopnea, PND, claudication or edema. ?Respiratory: denies cough, dyspnea, DOE, pleurisy, hoarseness, laryngitis, wheezing.  ?Gastrointestinal: Denies dysphagia, odynophagia, heartburn, reflux, water brash, abdominal pain or cramps, nausea, vomiting, bloating, diarrhea, constipation, hematemesis, melena, hematochezia  or hemorrhoids. ?Genitourinary: Denies dysuria, frequency,  discharge, hematuria or flank pain. Has urgency, nocturia x 2-3 & occasional hesitancy. ?Musculoskeletal: Denies arthralgias,  myalgias, stiffness, jt. swelling, pain, limping or strain/sprain.  ?Skin: Denies pruritus, rash, hives, warts, acne, eczema or change in skin lesion(s). ?Neuro: No weakness, tremor, incoordination, spasms, paresthesia or pain. ?Psychiatric: Denies confusion, memory loss or sensory loss. ?Endo: Denies change in weight, skin or hair change.  ?Heme/Lymph: No excessive bleeding, bruising or enlarged lymph nodes. ? ?Physical Exam ? ?BP 138/68   Pulse 68   Temp 97.9 ?F (36.6 ?C)   Resp 17   Ht 6' 3.2" (1.91 m)   Wt 198 lb 9.6 oz (90.1 kg)   SpO2 96%   BMI 24.69 kg/m?  ? ?Appears well nourished, well groomed  and in no distress. ? ?Eyes: PERRLA, EOMs, conjunctiva no swelling or erythema. ?Sinuses: No frontal/maxillary tenderness ?ENT/Mouth: EAC's clear, TM's nl w/o erythema, bulging. Nares clear w/o erythema, swelling, exudates. Oropharynx clear without erythema or exudates. Oral hygiene is good. Tongue normal, non obstructing. Hearing intact.  ?  Neck: Supple. Thyroid nl. Car 2+/2+ without bruits, nodes or JVD. ?Chest: Respirations nl with BS clear & equal w/o rales, rhonchi, wheezing or stridor.  ?Cor: Heart sounds normal w/ regular rate and rhythm without sig. murmurs, gallops, clicks or rubs. Peripheral pulses normal and equal  without edema.  ?Abdomen: Soft & bowel sounds normal. Non-tender w/o guarding, rebound, hernias, masses or organomegaly.  ?Lymphatics: Unremarkable.  ?Musculoskeletal: Full ROM all peripheral extremities, joint stability, 5/5 strength and normal gait.  ?Skin: Warm, dry without exposed rashes, lesions or ecchymosis apparent.  ?Neuro: Cranial nerves intact, reflexes equal bilaterally. Sensory-motor testing grossly intact. Tendon reflexes grossly intact.  ?Pysch: Alert & oriented x 3.  Insight and judgement nl & appropriate. No ideations. ? ?Assessment and Plan: ? ?1. Syncope and collapse ? ?- COMPLETE METABOLIC PANEL WITH GFR ?- Magnesium ? ?2. Essential hypertension ? ?- Continue  medication, monitor blood pressure at home.  ?- Continue DASH diet.  Reminder to go to the ER if any CP,  ?SOB, nausea, dizziness, severe HA, changes vision/speech. ? ?- COMPLETE METABOLIC PANEL WITH GFR ?- Magnesium ?- TS

## 2021-09-02 NOTE — Patient Instructions (Signed)

## 2021-09-03 ENCOUNTER — Ambulatory Visit (INDEPENDENT_AMBULATORY_CARE_PROVIDER_SITE_OTHER): Payer: PPO | Admitting: Internal Medicine

## 2021-09-03 ENCOUNTER — Encounter: Payer: Self-pay | Admitting: Internal Medicine

## 2021-09-03 VITALS — BP 138/68 | HR 68 | Temp 97.9°F | Resp 17 | Ht 75.2 in | Wt 198.6 lb

## 2021-09-03 DIAGNOSIS — R7309 Other abnormal glucose: Secondary | ICD-10-CM | POA: Diagnosis not present

## 2021-09-03 DIAGNOSIS — I10 Essential (primary) hypertension: Secondary | ICD-10-CM | POA: Diagnosis not present

## 2021-09-03 DIAGNOSIS — E782 Mixed hyperlipidemia: Secondary | ICD-10-CM | POA: Diagnosis not present

## 2021-09-03 DIAGNOSIS — N401 Enlarged prostate with lower urinary tract symptoms: Secondary | ICD-10-CM | POA: Diagnosis not present

## 2021-09-03 DIAGNOSIS — R7303 Prediabetes: Secondary | ICD-10-CM

## 2021-09-03 DIAGNOSIS — R55 Syncope and collapse: Secondary | ICD-10-CM | POA: Diagnosis not present

## 2021-09-03 DIAGNOSIS — E559 Vitamin D deficiency, unspecified: Secondary | ICD-10-CM | POA: Diagnosis not present

## 2021-09-03 DIAGNOSIS — Z79899 Other long term (current) drug therapy: Secondary | ICD-10-CM

## 2021-09-03 DIAGNOSIS — Z1211 Encounter for screening for malignant neoplasm of colon: Secondary | ICD-10-CM

## 2021-09-03 DIAGNOSIS — M1000 Idiopathic gout, unspecified site: Secondary | ICD-10-CM

## 2021-09-03 DIAGNOSIS — K219 Gastro-esophageal reflux disease without esophagitis: Secondary | ICD-10-CM | POA: Diagnosis not present

## 2021-09-03 DIAGNOSIS — Z125 Encounter for screening for malignant neoplasm of prostate: Secondary | ICD-10-CM | POA: Diagnosis not present

## 2021-09-04 LAB — COMPLETE METABOLIC PANEL WITH GFR
AG Ratio: 2 (calc) (ref 1.0–2.5)
ALT: 25 U/L (ref 9–46)
AST: 27 U/L (ref 10–35)
Albumin: 4.5 g/dL (ref 3.6–5.1)
Alkaline phosphatase (APISO): 65 U/L (ref 35–144)
BUN: 16 mg/dL (ref 7–25)
CO2: 28 mmol/L (ref 20–32)
Calcium: 9.9 mg/dL (ref 8.6–10.3)
Chloride: 102 mmol/L (ref 98–110)
Creat: 0.77 mg/dL (ref 0.70–1.22)
Globulin: 2.3 g/dL (calc) (ref 1.9–3.7)
Glucose, Bld: 115 mg/dL — ABNORMAL HIGH (ref 65–99)
Potassium: 4.3 mmol/L (ref 3.5–5.3)
Sodium: 140 mmol/L (ref 135–146)
Total Bilirubin: 0.8 mg/dL (ref 0.2–1.2)
Total Protein: 6.8 g/dL (ref 6.1–8.1)
eGFR: 91 mL/min/{1.73_m2} (ref >=60)

## 2021-09-04 LAB — URINALYSIS, ROUTINE W REFLEX MICROSCOPIC
Bilirubin Urine: NEGATIVE
Glucose, UA: NEGATIVE
Hgb urine dipstick: NEGATIVE
Ketones, ur: NEGATIVE
Leukocytes,Ua: NEGATIVE
Nitrite: NEGATIVE
Protein, ur: NEGATIVE
Specific Gravity, Urine: 1.009 (ref 1.001–1.035)
pH: 7.5 (ref 5.0–8.0)

## 2021-09-04 LAB — LIPID PANEL
Cholesterol: 172 mg/dL (ref <200)
HDL: 61 mg/dL (ref >=40)
LDL Cholesterol (Calc): 95 mg/dL (calc)
Non-HDL Cholesterol (Calc): 111 mg/dL (calc) (ref <130)
Total CHOL/HDL Ratio: 2.8 (calc) (ref <5.0)
Triglycerides: 74 mg/dL (ref <150)

## 2021-09-04 LAB — MICROALBUMIN / CREATININE URINE RATIO
Creatinine, Urine: 34 mg/dL (ref 20–320)
Microalb Creat Ratio: 26 mcg/mg creat (ref <30)
Microalb, Ur: 0.9 mg/dL

## 2021-09-04 LAB — PSA: PSA: 1.52 ng/mL (ref <=4.00)

## 2021-09-04 LAB — MAGNESIUM: Magnesium: 2.2 mg/dL (ref 1.5–2.5)

## 2021-09-04 LAB — TSH: TSH: 1.24 mIU/L (ref 0.40–4.50)

## 2021-09-04 LAB — URIC ACID: Uric Acid, Serum: 5.7 mg/dL (ref 4.0–8.0)

## 2021-09-04 LAB — VITAMIN D 25 HYDROXY (VIT D DEFICIENCY, FRACTURES): Vit D, 25-Hydroxy: 83 ng/mL (ref 30–100)

## 2021-09-04 NOTE — Progress Notes (Signed)
<><><><><><><><><><><><><><><><><><><><><><><><><><><><><><><><><> ? ?-   Test results slightly outside the reference range are not unusual. ?If there is anything important, I will review this with you,  ?otherwise it is considered normal test values.  ?If you have further questions,  ?please do not hesitate to contact me at the office or via My Chart.  ? ?<><><><><><><><><><><><><><><><><><><><><><><><><><><><><><><><><> ?<><><><><><><><><><><><><><><><><><><><><><><><><><><><><><><><><> ? ?-  Total Chol = 172    &  LDL Chol = 95      -   both  Great  ? ?- Very low risk for Heart Attack  / Stroke ? ?<><><><><><><><><><><><><><><><><><><><><><><><><><><><><><><><><> ? ?- Vitamin D = 83  - Excellent  - Please keep dose same  ? ?<><><><><><><><><><><><><><><><><><><><><><><><><><><><><><><><><> ? ?- PSA - Low   - Great  ! ? ?<><><><><><><><><><><><><><><><><><><><><><><><><><><><><><><><><> ? ?- Uric Acid  / Gout Test - Normal  -  Please continue Allopurinol same  ? ?<><><><><><><><><><><><><><><><><><><><><><><><><><><><><><><><><> ? ?- All Else - CBC - Kidneys -  U/A - Electrolytes - Liver - Magnesium & Thyroid   ? ?- all  Normal / OK ?<><><><><><><><><><><><><><><><><><><><><><><><><><><><><><><><><> ?<><><><><><><><><><><><><><><><><><><><><><><><><><><><><><><><><> ? ?- Keep up the [place] Work  !  ? ?<><><><><><><><><><><><><><><><><><><><><><><><><><><><><><><><><> ?<><><><><><><><><><><><><><><><><><><><><><><><><><><><><><><><><> ? ? ? ?

## 2021-09-13 ENCOUNTER — Encounter: Payer: Self-pay | Admitting: Internal Medicine

## 2021-09-13 ENCOUNTER — Ambulatory Visit (INDEPENDENT_AMBULATORY_CARE_PROVIDER_SITE_OTHER): Payer: PPO | Admitting: Internal Medicine

## 2021-09-13 VITALS — BP 152/68 | HR 53 | Ht 75.5 in | Wt 201.2 lb

## 2021-09-13 DIAGNOSIS — E782 Mixed hyperlipidemia: Secondary | ICD-10-CM

## 2021-09-13 DIAGNOSIS — I10 Essential (primary) hypertension: Secondary | ICD-10-CM | POA: Diagnosis not present

## 2021-09-13 DIAGNOSIS — R55 Syncope and collapse: Secondary | ICD-10-CM

## 2021-09-13 NOTE — Patient Instructions (Signed)
Medication Instructions:  ?No Changes In Medications at this time.  ?*If you need a refill on your cardiac medications before your next appointment, please call your pharmacy* ? ?Follow-Up: ?At [facility], you and your health needs are our priority.  As part of our continuing mission to provide you with exceptional heart care, we have created designated Provider Care Teams.  These Care Teams include your primary Cardiologist (physician) and Advanced Practice Providers (APPs -  Physician Assistants and Nurse Practitioners) who all work together to provide you with the care you need, when you need it. ? ?We recommend signing up for the patient portal called "MyChart".  Sign up information is provided on this After Visit Summary.  MyChart is used to connect with patients for Virtual Visits (Telemedicine).  Patients are able to view lab/test results, encounter notes, upcoming appointments, etc.  Non-urgent messages can be sent to your provider as well.   ?To learn more about what you can do with MyChart, go to [email or web address].   ? ?Your next appointment:   ?July 28th at 8:20 ? ?The format for your next appointment:   ?In Person ? ?Provider:   ?Elouise Munroe, MD   ? ? ? ? ? ?  ?

## 2021-09-13 NOTE — Progress Notes (Signed)
?Cardiology Office Note:   ? ?Date:  09/13/2021  ? ?ID:  Michael Waller, DOB [birth year], MRN [id number] ? ?PCP:  Unk Pinto, MD  ?Cardiologist:  None  ?Electrophysiologist:  None  ? ?Referring MD: Unk Pinto, MD  ? ?Chief Complaint/Reason for Referral: ?Hospital follow-up ? ?History of Present Illness:   ? ?Michael Waller is a 81 y.o. male with a history of hypertension, hyperlipidemia, GERD, gout, and nephrolithiasis, here for post-hospital follow-up. ? ?On 08/12/2021 he was admitted to the hospital following a syncopal episode with a 5 minute loss of consciousness. At the time he was sitting in front of the computer. A 2D echo showed no wall motion abnormalities EF of 60%, and telemetry remained sinus rhythm 65-70 bpm. After cardiology consultation a 30 day monitor was recommended as an outpatient. Monitor results showed no pauses or malignant arrhythmia to explain syncope. ? ?He denies any recurrent syncope or other significant events since being discharged from the hospital. The day before yesterday, his at home blood pressure was 119/65. ? ?He endorses a "nervous stomach" that was present this morning. This is an ongoing symptom. He notes that indigestion and gas/bloating will increase his heart rate.  ? ?Additionally he complains of a kind of lightheadedness every now and then that is difficult for him to describe. When getting up in the middle of the night he is sure to sit on the edge of the bed for a minute before standing. ? ?Also he reports that Enalapril was recently changed to 20 mg once daily in the evenings given PMD concern for orthostatic hypotension leading to syncope.  ? ?Working out in the yard or garage for a while will cause his heart rate and blood pressure to lower once going back inside. Typically he drinks at least 85-100 oz of water a day. ? ?The patient denies chest pain, chest pressure, dyspnea at rest or with exertion, palpitations, PND, orthopnea, or leg swelling. Denies  cough, fever, chills. Denies nausea, vomiting. Denies snoring. ? ?He has sinus congestion that he is treating with Zyrtec. However, he believes the Zyrtec is causing side effects including drowsiness. ? ? ?Past Medical History:  ?Diagnosis Date  ? Benign localized prostatic hyperplasia with lower urinary tract symptoms (LUTS)   ? Bruises easily   ? Elevated PSA   ? GERD (gastroesophageal reflux disease)   ? History of gout   ? History of kidney stones   ? Hyperlipidemia   ? Hypertension   ? Nephrolithiasis   ? Nocturia   ? Pre-diabetes   ? Vitamin D deficiency   ? Wears dentures   ? full upper and lower partial   ? ? ?Past Surgical History:  ?Procedure Laterality Date  ? COLONOSCOPY  2008  ? CYSTOSCOPY WITH RETROGRADE PYELOGRAM, URETEROSCOPY AND STENT PLACEMENT Bilateral 04/19/2016  ? Procedure: CYSTOSCOPY WITH BILATERAL RETROGRADE URETEROSCOPY BASKET EXTRACTION;  Surgeon: Irine Seal, MD;  Location: [facility];  Service: Urology;  Laterality: Bilateral;  ? EXTRACORPOREAL SHOCK WAVE LITHOTRIPSY  [phone]  ? HAND LIGAMENT RECONSTRUCTION Right 2009  ? PROSTATE BIOPSY  2000, 12/2004  ? negative  ? ? ?Current Medications: ?Current Meds  ?Medication Sig  ? acetaminophen (TYLENOL) 500 MG tablet Take 1,000 mg by mouth every 6 (six) hours as needed for mild pain.  ? Alfalfa 500 MG TABS Take 1,000 mg by mouth daily.  ? allopurinol (ZYLOPRIM) 300 MG tablet TAKE 1 TABLET BY MOUTH DAILY FOR GOUT (Patient taking differently: Take 150 mg by  mouth daily.)  ? aspirin 81 MG tablet Take 81 mg by mouth daily.  ? atorvastatin (LIPITOR) 10 MG tablet TAKE 1 TABLET BY MOUTH DAILY FOR CHOLESTEROL (Patient taking differently: Take 10 mg by mouth daily.)  ? bisoprolol-hydrochlorothiazide (ZIAC) 2.5-6.25 MG tablet TAKE 1 TABLET BY MOUTH DAILY FOR BLOOD PRESSURE  ? blood glucose meter kit and supplies Dispense based insurance preference. E11.9  ? cetirizine (ZYRTEC) 10 MG tablet Take 10 mg by mouth daily as needed for  allergies.  ? Cholecalciferol (VITAMIN D3) 2000 units TABS Take 6,000 Units by mouth daily.  ? Cinnamon 500 MG TABS Take 1,000 mg by mouth daily.  ? doxazosin (CARDURA) 8 MG tablet TAKE 1/2 TABLET BY MOUTH AT BEDTIME FOR BLOOD PRESSURE AND PROSTATE  ? enalapril (VASOTEC) 20 MG tablet TAKE 1 TABLET BY MOUTH TWICE DAILY FOR BLOOD PRESSURE (Patient taking differently: Take 10 mg by mouth 2 (two) times daily.)  ? finasteride (PROSCAR) 5 MG tablet Take 5 mg by mouth every evening.   ? Garlic 9629 MG CAPS Take 1,000 mg by mouth daily.  ? glucose blood (ONE TOUCH ULTRA TEST) test strip USE TO CHECK BLOOD SUGAR ONCE DAILY  ? glucose blood (ONETOUCH ULTRA) test strip Check   Blood Sugar   Daily  (Dx: e11.9)  ? glucose blood test strip Check blood sugar once daily  ? latanoprost (XALATAN) 0.005 % ophthalmic solution Place 1 drop into both eyes daily.  ? Magnesium 250 MG TABS Take 250 mg by mouth 2 (two) times daily. Breakfast and lunch  ? minoxidil (LONITEN) 2.5 MG tablet TAKE 1-2 TABLETS BY MOUTH EVERY MORNING FOR BLOOD PRESSURE  ? Multiple Vitamin (MULTIVITAMIN) tablet Take 1 tablet by mouth daily.  ? Omega-3 Fatty Acids (OMEGA-3 FISH OIL) 1200 MG CAPS Take 1,200 mg by mouth daily with lunch.  ? omeprazole (PRILOSEC) 20 MG capsule TAKE 1 CAPSULE BY MOUTH DAILY (Patient taking differently: 20 mg daily.)  ? OneTouch Delica Lancets 52W MISC Check blood sugar 1 time a day.  ? zinc gluconate 50 MG tablet Take 50 mg by mouth daily.  ?  ? ?Allergies:   Adhesive [tape]  ? ?Social History  ? ?Tobacco Use  ? Smoking status: Former  ?  Years: 5.00  ?  Types: Cigarettes  ?  Quit date: 05/23/1965  ?  Years since quitting: 56.3  ? Smokeless tobacco: Former  ?  Types: Chew  ?  Quit date: 04/11/1966  ?Vaping Use  ? Vaping Use: Never used  ?Substance Use Topics  ? Alcohol use: No  ? Drug use: No  ?  ? ?Family History: ?The patient's family history includes Breast cancer in his sister; Cancer in his sister and sister; Colon cancer in his  sister; Diabetes in his brother, brother, brother, father, mother, and sister; Heart attack in his father; Heart disease in his brother, father, and mother; Pancreatic cancer in his brother; Stroke in his brother. There is no history of Stomach cancer. ? ?ROS:   ?Please see the history of present illness.    ?(+) Abdominal discomfort ?(+) Lightheadedness ?(+) Sinus congestion ?(+) Drowsiness ?All other systems reviewed and are negative. ? ?EKGs/Labs/Other Studies Reviewed:   ? ?The following studies were reviewed today: ? ?Monitor 08/2021: ?Impression: no pauses or malignant arrhythmia to explain syncope. ?  ?Patch Wear Time:  14 days and 0 hours (2023-03-27T18:16:45-0400 to 2023-04-10T18:16:45-0400) ?  ?Patient had a min HR of 37 bpm, max HR of 152 bpm, and avg HR of  56 bpm. Predominant underlying rhythm was Sinus Rhythm. 15 Supraventricular Tachycardia runs occurred, the run with the fastest interval lasting 5 beats with a max rate of 152 bpm, the  ?longest lasting 14 beats with an avg rate of 104 bpm. Isolated SVEs were rare (<1.0%), SVE Couplets were rare (<1.0%), and SVE Triplets were rare (<1.0%). Isolated VEs were rare (<1.0%), VE Couplets were rare (<1.0%), and no VE Triplets were present.  ? ?Echo 08/12/2021: ? 1. Left ventricular ejection fraction, by estimation, is 60 to 65%. The  ?left ventricle has normal function. The left ventricle has no regional  ?wall motion abnormalities. Left ventricular diastolic parameters are  ?indeterminate.  ? 2. Right ventricular systolic function is normal. The right ventricular  ?size is normal. There is normal pulmonary artery systolic pressure.  ? 3. Left atrial size was mildly dilated.  ? 4. The mitral valve is normal in structure. No evidence of mitral valve  ?regurgitation. No evidence of mitral stenosis.  ? 5. The aortic valve is tricuspid. Aortic valve regurgitation is not  ?visualized. No aortic stenosis is present.  ? 6. The inferior vena cava is normal in size  with greater than 50%  ?respiratory variability, suggesting right atrial pressure of 3 mmHg.  ? ?Bilateral Carotid Duplex 08/12/2021: ?Summary:  ?Right Carotid: Velocities in the right ICA are consistent with a 1-39%

## 2021-09-16 NOTE — Progress Notes (Signed)
Occasional burst of fast beats, nothing prolonged. No significant irregular rhythm to explain the recent passing out spell. There were a few slow heart rate, but no triggered event to suggest the patient was symptomatic with those.

## 2021-10-25 DIAGNOSIS — H52223 Regular astigmatism, bilateral: Secondary | ICD-10-CM | POA: Diagnosis not present

## 2021-10-25 DIAGNOSIS — H5213 Myopia, bilateral: Secondary | ICD-10-CM | POA: Diagnosis not present

## 2021-10-25 DIAGNOSIS — H401131 Primary open-angle glaucoma, bilateral, mild stage: Secondary | ICD-10-CM | POA: Diagnosis not present

## 2021-10-25 DIAGNOSIS — H524 Presbyopia: Secondary | ICD-10-CM | POA: Diagnosis not present

## 2021-11-25 DIAGNOSIS — L570 Actinic keratosis: Secondary | ICD-10-CM | POA: Diagnosis not present

## 2021-11-25 DIAGNOSIS — L821 Other seborrheic keratosis: Secondary | ICD-10-CM | POA: Diagnosis not present

## 2021-12-01 ENCOUNTER — Ambulatory Visit: Payer: PPO | Admitting: Adult Health

## 2021-12-17 ENCOUNTER — Encounter: Payer: Self-pay | Admitting: Internal Medicine

## 2021-12-17 ENCOUNTER — Ambulatory Visit (INDEPENDENT_AMBULATORY_CARE_PROVIDER_SITE_OTHER): Payer: PPO | Admitting: Internal Medicine

## 2021-12-17 VITALS — BP 120/68 | HR 56 | Resp 20 | Ht 75.0 in | Wt 201.6 lb

## 2021-12-17 DIAGNOSIS — R55 Syncope and collapse: Secondary | ICD-10-CM | POA: Diagnosis not present

## 2021-12-17 DIAGNOSIS — E782 Mixed hyperlipidemia: Secondary | ICD-10-CM | POA: Diagnosis not present

## 2021-12-17 DIAGNOSIS — I10 Essential (primary) hypertension: Secondary | ICD-10-CM | POA: Diagnosis not present

## 2021-12-17 NOTE — Patient Instructions (Signed)
Medication Instructions:  No Changes In Medications at this time.   *If you need a refill on your cardiac medications before your next appointment, please call your pharmacy*  Lab Work: None Ordered At This Time.  If you have labs (blood work) drawn today and your tests are completely normal, you will receive your results only by: [place] (if you have MyChart) OR A paper copy in the mail If you have any lab test that is abnormal or we need to change your treatment, we will call you to review the results.  Testing/Procedures: None Ordered At This Time.   Follow-Up: At [facility], you and your health needs are our priority.  As part of our continuing mission to provide you with exceptional heart care, we have created designated Provider Care Teams.  These Care Teams include your primary Cardiologist (physician) and Advanced Practice Providers (APPs -  Physician Assistants and Nurse Practitioners) who all work together to provide you with the care you need, when you need it.  Your next appointment:   6 month(s)  The format for your next appointment:   In Person  Provider:   Elouise Munroe, MD

## 2021-12-17 NOTE — Progress Notes (Signed)
Cardiology Office Note:    Date:  12/17/2021   ID:  Michael Waller, DOB Apr 24, 1941, MRN [id number]  PCP:  Unk Pinto, MD  Cardiologist:  Elouise Munroe, MD  Electrophysiologist:  None   Referring MD: Unk Pinto, MD   Chief Complaint/Reason for Referral: Hospital follow-up  History of Present Illness:    Michael Waller is a 81 y.o. male with a history of hypertension, hyperlipidemia, GERD, gout, and nephrolithiasis, here for follow-up.  On 08/12/2021 he was admitted to the hospital following a syncopal episode with a 5 minute loss of consciousness. At the time he was sitting in front of the computer. A 2D echo showed no wall motion abnormalities EF of 60%, and telemetry remained sinus rhythm 65-70 bpm. After cardiology consultation a 30 day monitor was recommended as an outpatient. Monitor results showed no pauses or malignant arrhythmia to explain syncope.  At his last appointment he reported a recent at home blood pressure of 119/65. He complained of ongoing episodes of a "nervous stomach," and occasional lightheadedness. He reported that Enalapril was changed to 20 mg once daily in the evenings given PMD concern for orthostatic hypotension leading to syncope. He was asked to keep a BP log to present at follow-up.  Today, he presents a BP log which is personally reviewed. He states his blood pressure is usually higher in the morning, and lower after he is active or working. After he goes outside and works his diastolic blood pressure may be as low as the 60's. He denies feeling any differently with his lower blood pressures.  Occasionally he continues to have his flare-ups of what he refers to as "nervous stomach." However, he is working on dietary changes such as cutting back on soft drinks. This seems to be helping. He usually drinks at least 80 oz of water. He continues to avoid adding any salt to his diet.  He stays very active with his work. The other day he mowed 2  lawns. BP log showed average BP around 120/60 with HR avg 50.   As of 07/2021 his A1C was 5.6.  He denies any palpitations, chest pain, shortness of breath, or peripheral edema. No lightheadedness, headaches, syncope, orthopnea, or PND.   Past Medical History:  Diagnosis Date   Benign localized prostatic hyperplasia with lower urinary tract symptoms (LUTS)    Bruises easily    Elevated PSA    GERD (gastroesophageal reflux disease)    History of gout    History of kidney stones    Hyperlipidemia    Hypertension    Nephrolithiasis    Nocturia    Pre-diabetes    Vitamin D deficiency    Wears dentures    full upper and lower partial     Past Surgical History:  Procedure Laterality Date   COLONOSCOPY  2008   CYSTOSCOPY WITH RETROGRADE PYELOGRAM, URETEROSCOPY AND STENT PLACEMENT Bilateral 04/19/2016   Procedure: CYSTOSCOPY WITH BILATERAL RETROGRADE URETEROSCOPY BASKET EXTRACTION;  Surgeon: Irine Seal, MD;  Location: [facility];  Service: Urology;  Laterality: Bilateral;   EXTRACORPOREAL SHOCK WAVE LITHOTRIPSY  [phone]   HAND LIGAMENT RECONSTRUCTION Right 2009   PROSTATE BIOPSY  2000, 12/2004   negative    Current Medications: Current Meds  Medication Sig   acetaminophen (TYLENOL) 500 MG tablet Take 1,000 mg by mouth every 6 (six) hours as needed for mild pain.   Alfalfa 500 MG TABS Take 1,000 mg by mouth daily.   allopurinol (ZYLOPRIM) 300 MG  tablet TAKE 1 TABLET BY MOUTH DAILY FOR GOUT (Patient taking differently: Take 150 mg by mouth daily.)   aspirin 81 MG tablet Take 81 mg by mouth daily.   atorvastatin (LIPITOR) 10 MG tablet TAKE 1 TABLET BY MOUTH DAILY FOR CHOLESTEROL (Patient taking differently: Take 10 mg by mouth daily.)   bisoprolol-hydrochlorothiazide (ZIAC) 2.5-6.25 MG tablet TAKE 1 TABLET BY MOUTH DAILY FOR BLOOD PRESSURE   blood glucose meter kit and supplies Dispense based insurance preference. E11.9   cetirizine (ZYRTEC) 10 MG tablet Take  10 mg by mouth daily as needed for allergies.   Cholecalciferol (VITAMIN D3) 2000 units TABS Take 6,000 Units by mouth daily.   Cinnamon 500 MG TABS Take 1,000 mg by mouth daily.   doxazosin (CARDURA) 8 MG tablet TAKE 1/2 TABLET BY MOUTH AT BEDTIME FOR BLOOD PRESSURE AND PROSTATE   enalapril (VASOTEC) 20 MG tablet TAKE 1 TABLET BY MOUTH TWICE DAILY FOR BLOOD PRESSURE (Patient taking differently: Take 10 mg by mouth 2 (two) times daily.)   finasteride (PROSCAR) 5 MG tablet Take 5 mg by mouth every evening.    Garlic 6945 MG CAPS Take 1,000 mg by mouth daily.   glucose blood (ONE TOUCH ULTRA TEST) test strip USE TO CHECK BLOOD SUGAR ONCE DAILY   glucose blood (ONETOUCH ULTRA) test strip Check   Blood Sugar   Daily  (Dx: e11.9)   glucose blood test strip Check blood sugar once daily   latanoprost (XALATAN) 0.005 % ophthalmic solution Place 1 drop into both eyes daily.   Magnesium 250 MG TABS Take 250 mg by mouth 2 (two) times daily. Breakfast and lunch   minoxidil (LONITEN) 2.5 MG tablet TAKE 1-2 TABLETS BY MOUTH EVERY MORNING FOR BLOOD PRESSURE   Multiple Vitamin (MULTIVITAMIN) tablet Take 1 tablet by mouth daily.   Omega-3 Fatty Acids (OMEGA-3 FISH OIL) 1200 MG CAPS Take 1,200 mg by mouth daily with lunch.   omeprazole (PRILOSEC) 20 MG capsule TAKE 1 CAPSULE BY MOUTH DAILY (Patient taking differently: 20 mg daily.)   OneTouch Delica Lancets 03U MISC Check blood sugar 1 time a day.   zinc gluconate 50 MG tablet Take 50 mg by mouth daily.     Allergies:   Adhesive [tape]   Social History   Tobacco Use   Smoking status: Former    Years: 5.00    Types: Cigarettes    Quit date: 05/23/1965    Years since quitting: 67.6   Smokeless tobacco: Former    Types: Chew    Quit date: 04/11/1966  Vaping Use   Vaping Use: Never used  Substance Use Topics   Alcohol use: No   Drug use: No     Family History: The patient's family history includes Breast cancer in his sister; Cancer in his sister  and sister; Colon cancer in his sister; Diabetes in his brother, brother, brother, father, mother, and sister; Heart attack in his father; Heart disease in his brother, father, and mother; Pancreatic cancer in his brother; Stroke in his brother. There is no history of Stomach cancer.  ROS:   Please see the history of present illness.    (+) GI upset All other systems reviewed and are negative.  EKGs/Labs/Other Studies Reviewed:    The following studies were reviewed today:  Monitor 08/2021: Impression: no pauses or malignant arrhythmia to explain syncope.   Patch Wear Time:  14 days and 0 hours (2023-03-27T18:16:45-0400 to 2023-04-10T18:16:45-0400)   Patient had a min HR of 37 bpm,  max HR of 152 bpm, and avg HR of 56 bpm. Predominant underlying rhythm was Sinus Rhythm. 15 Supraventricular Tachycardia runs occurred, the run with the fastest interval lasting 5 beats with a max rate of 152 bpm, the  longest lasting 14 beats with an avg rate of 104 bpm. Isolated SVEs were rare (<1.0%), SVE Couplets were rare (<1.0%), and SVE Triplets were rare (<1.0%). Isolated VEs were rare (<1.0%), VE Couplets were rare (<1.0%), and no VE Triplets were present.   Echo 08/12/2021:  1. Left ventricular ejection fraction, by estimation, is 60 to 65%. The  left ventricle has normal function. The left ventricle has no regional  wall motion abnormalities. Left ventricular diastolic parameters are  indeterminate.   2. Right ventricular systolic function is normal. The right ventricular  size is normal. There is normal pulmonary artery systolic pressure.   3. Left atrial size was mildly dilated.   4. The mitral valve is normal in structure. No evidence of mitral valve  regurgitation. No evidence of mitral stenosis.   5. The aortic valve is tricuspid. Aortic valve regurgitation is not  visualized. No aortic stenosis is present.   6. The inferior vena cava is normal in size with greater than 50%  respiratory  variability, suggesting right atrial pressure of 3 mmHg.   Bilateral Carotid Duplex 08/12/2021: Summary:  Right Carotid: Velocities in the right ICA are consistent with a 1-39%  stenosis.   Left Carotid: Velocities in the left ICA are consistent with a 1-39%  stenosis.   Vertebrals: Bilateral vertebral arteries demonstrate antegrade flow.    EKG:  EKG is personally reviewed. 12/17/2021:  EKG was not ordered. 09/13/2021: Sinus bradycardia. Rate 53 bpm.  Imaging studies that I have independently reviewed today: n/a  Recent Labs: 08/12/2021: Hemoglobin 13.9; Platelets 168 09/03/2021: ALT 25; BUN 16; Creat 0.77; Magnesium 2.2; Potassium 4.3; Sodium 140; TSH 1.24   Recent Lipid Panel    Component Value Date/Time   CHOL 172 09/03/2021 1150   TRIG 74 09/03/2021 1150   HDL 61 09/03/2021 1150   CHOLHDL 2.8 09/03/2021 1150   VLDL 11 12/22/2016 1020   LDLCALC 95 09/03/2021 1150    Physical Exam:    VS:  BP 120/68 (BP Location: Left Arm, Patient Position: Sitting, Cuff Size: Normal)   Pulse (!) 56   Resp 20   Ht _0  (1.905 m)   Wt 201 lb 9.6 oz (91.4 kg)   SpO2 98%   BMI 25.20 kg/m     Wt Readings from Last 5 Encounters:  12/17/21 201 lb 9.6 oz (91.4 kg)  09/13/21 201 lb 3.2 oz (91.3 kg)  09/03/21 198 lb 9.6 oz (90.1 kg)  08/13/21 196 lb 3.4 oz (89 kg)  03/31/21 198 lb 6.4 oz (90 kg)    Constitutional: No acute distress Eyes: sclera non-icteric, normal conjunctiva and lids ENMT: normal dentition, moist mucous membranes Cardiovascular: regular rhythm, normal rate, no murmur. S1 and S2 normal. No jugular venous distention.  Respiratory: clear to auscultation bilaterally GI : normal bowel sounds, soft and nontender. No distention.   MSK: extremities warm, well perfused. No edema.  NEURO: grossly nonfocal exam, moves all extremities. PSYCH: alert and oriented x 3, normal mood and affect.   ASSESSMENT:    1. Syncope and collapse   2. Essential hypertension   3. Mixed  hyperlipidemia     PLAN:    Syncope and collapse  Essential hypertension  Mixed hyperlipidemia  Monitor result benign. Syncope unlikely to  be arrhythmogenic. May have been related to hypotension, his BP meds are being followed by PCP. BP log reviewed and stable. - somewhat bradycardic, consider reducing bisoprolol if needed for benefit of HR. No changes today based on shared decision making. - continue current antihypertensive therapy, he has close follow up with PCP. Stable BP log on review. - continue ASA 81 mg daily -contine atorvastatin 10 mg daily  Plan:   No changes, would stop bisoprolol if he has a recurrent syncopal episode. Keep monitoring blood pressures.  Follow-up:  6 months.  Total time of encounter: 20 minutes total time of encounter, including 16 minutes spent in face-to-face patient care on the date of this encounter. This time includes coordination of care and counseling regarding above mentioned problem list. Remainder of non-face-to-face time involved reviewing chart documents/testing relevant to the patient encounter and documentation in the medical record. I have independently reviewed documentation from referring provider.   Cherlynn Kaiser, MD, [facility]   Shared Decision Making/Informed Consent:       Medication Adjustments/Labs and Tests Ordered: Current medicines are reviewed at length with the patient today.  Concerns regarding medicines are outlined above.   No orders of the defined types were placed in this encounter.  No orders of the defined types were placed in this encounter.  Patient Instructions  Medication Instructions:  No Changes In Medications at this time.   *If you need a refill on your cardiac medications before your next appointment, please call your pharmacy*  Lab Work: None Ordered At This Time.  If you have labs (blood work) drawn today and your tests are completely normal, you will receive your  results only by: [facility] (if you have MyChart) OR A paper copy in the mail If you have any lab test that is abnormal or we need to change your treatment, we will call you to review the results.  Testing/Procedures: None Ordered At This Time.   Follow-Up: At [facility], you and your health needs are our priority.  As part of our continuing mission to provide you with exceptional heart care, we have created designated Provider Care Teams.  These Care Teams include your primary Cardiologist (physician) and Advanced Practice Providers (APPs -  Physician Assistants and Nurse Practitioners) who all work together to provide you with the care you need, when you need it.  Your next appointment:   6 month(s)  The format for your next appointment:   In Person  Provider:   Elouise Munroe, MD        [facility],acting as a scribe for Elouise Munroe, MD.,have documented all relevant documentation on the behalf of Elouise Munroe, MD,as directed by  Elouise Munroe, MD while in the presence of Elouise Munroe, MD.  I, Elouise Munroe, MD, have reviewed all documentation for this visit. The documentation on today's date of service for the exam, diagnosis, procedures, and orders are all accurate and complete.

## 2021-12-21 ENCOUNTER — Encounter: Payer: Self-pay | Admitting: Nurse Practitioner

## 2021-12-21 ENCOUNTER — Ambulatory Visit (INDEPENDENT_AMBULATORY_CARE_PROVIDER_SITE_OTHER): Payer: PPO | Admitting: Nurse Practitioner

## 2021-12-21 VITALS — BP 150/78 | HR 53 | Temp 97.7°F | Ht 75.0 in | Wt 202.0 lb

## 2021-12-21 DIAGNOSIS — Z0001 Encounter for general adult medical examination with abnormal findings: Secondary | ICD-10-CM | POA: Diagnosis not present

## 2021-12-21 DIAGNOSIS — E782 Mixed hyperlipidemia: Secondary | ICD-10-CM

## 2021-12-21 DIAGNOSIS — R7303 Prediabetes: Secondary | ICD-10-CM | POA: Diagnosis not present

## 2021-12-21 DIAGNOSIS — R001 Bradycardia, unspecified: Secondary | ICD-10-CM

## 2021-12-21 DIAGNOSIS — I10 Essential (primary) hypertension: Secondary | ICD-10-CM

## 2021-12-21 DIAGNOSIS — R6889 Other general symptoms and signs: Secondary | ICD-10-CM

## 2021-12-21 DIAGNOSIS — M1000 Idiopathic gout, unspecified site: Secondary | ICD-10-CM | POA: Diagnosis not present

## 2021-12-21 DIAGNOSIS — N401 Enlarged prostate with lower urinary tract symptoms: Secondary | ICD-10-CM

## 2021-12-21 DIAGNOSIS — K219 Gastro-esophageal reflux disease without esophagitis: Secondary | ICD-10-CM

## 2021-12-21 DIAGNOSIS — Z0000 Encounter for general adult medical examination without abnormal findings: Secondary | ICD-10-CM

## 2021-12-21 DIAGNOSIS — H9071 Mixed conductive and sensorineural hearing loss, unilateral, right ear, with unrestricted hearing on the contralateral side: Secondary | ICD-10-CM

## 2021-12-21 DIAGNOSIS — J301 Allergic rhinitis due to pollen: Secondary | ICD-10-CM

## 2021-12-21 DIAGNOSIS — Z79899 Other long term (current) drug therapy: Secondary | ICD-10-CM | POA: Diagnosis not present

## 2021-12-21 DIAGNOSIS — Z6825 Body mass index (BMI) 25.0-25.9, adult: Secondary | ICD-10-CM

## 2021-12-21 DIAGNOSIS — E559 Vitamin D deficiency, unspecified: Secondary | ICD-10-CM | POA: Diagnosis not present

## 2021-12-21 NOTE — Progress Notes (Signed)
MEDICARE ANNUAL WELLNESS VISIT AND FOLLOW UP   Assessment:  : Encounter for Medicare annual wellness exam Due Annually   Essential hypertension BP slightly elevated  office.   Has reports of home readings which are WNL and <130/90 on averrage Continue current medications: Enalapril 10 mg BID , Bisoprolol 2.67m-6.25mg daily. Minoxidil 2.544mone daily. Cardura 88m29m/2 tab qhs Monitor blood pressure at home; call if consistently over 130/80 Continue DASH diet.   Continue to follow with Cardiology. Reminder to go to the ER if any CP, SOB, nausea, dizziness, severe HA, changes vision/speech, left arm numbness and tingling and jaw pain. -     CBC with Diff -     COMPLETE METABOLIC PANEL WITH GFR  Hyperlipidemia, mixed Controlled - checked 09/03/2021 and WNL Continue medications;  Discussed lifestyle modifications. Recommended diet heavy in fruits and veggies, omega 3's. Decrease consumption of animal meats, cheeses, and dairy products. Remain active and exercise as tolerated. Continue to monitor.   Bradycardia Review of in home readings are on average above 60. Occasional drop to mid 40's. Recent reduction of Enalapril from 20 mg to 10 mg in the PM by PCP Dr. McKMelford Aaseolerating well. Continue to monitor pulse, if consistently running in the 40's or developing dizziness call the office will stop Ziac and add low dose HCTZ Change position slowly Stay well hydrated.  Benign prostatic hyperplasia with lower urinary tract symptoms, symptom details unspecified Doing well at this time Continue medications: doxasosin 88mg32m2 tab nighty, finasteride 5mg 77mning Will continue to monitor PSA levels checked 09/01/2021 and WNL  Gout, unspecified cause, unspecified chronicity, unspecified site Continue allopurinol 300mg 23my No recent flares Discussed dietary modifications Continue to monitor  Gastroesophageal reflux disease without esophagitis Continue PPI/H2 blocker, diet  discussed Omeprazole 20mg d1m OTC Gas reducer discussed.  Vitamin D deficiency Continue supplementation to maintain goal of 70-100 Taking Vitamin D 6,000 IU daily Levels WNL 09/03/21 at 83.  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 All questions and concerns regarding medications addressed.     Seasonal allergic rhinitis due to pollen Has noticed fatigued the next day if take Claritin, suggest using childrens dose No recent flare. Continue to monitor   BMI 24.0-24.9,adult  Overweight  Discussed dietary and exercise modifications Recommended diet heavy in fruits and veggies and low in animal meats, cheeses, and dairy products   Further disposition pending results if labs check today. Discussed med's effects and SE's.   Over 30 minutes of face to face interview, exam, counseling, chart review, and critical decision making was performed.    Future Appointments  Date Time Provider DepartmBig Timber/2023 10:00 AM McKeownUnk PintoAM-[facility] None  10/12/2022 11:00 AM CranforDarrol JumpAM-[facility] None     Plan:   During the course of the visit the patient was educated and counseled about appropriate screening and preventive services including:   Pneumococcal vaccine  Influenza vaccine Prevnar 13 Td vaccine Screening electrocardiogram Colorectal cancer screening Diabetes screening Glaucoma screening Nutrition counseling    Subjective:  Michael VIKTOR PHILIPP1 y.o.62resents for medicare annual welllnesss and 3 month follow up on hypertension, HLD, pre-diabetes, weight and vitamin D deficiency,GERD and gout controlled by meds.    Overall he reports feeling well today.  He spends lots of time  in his garden and doing yard  work.    He is on Latanoprost drops for glaucoma from Dr. Sabra Heck , was last seen 01/29/21  He does have seasonal allergic rhinitis which Claritin does help but notices fatigue the next day if he takes the medication so has not been taking regularly.  He has not yet tried a children's dose.  Allergies have been better controlled this year.  His blood pressure has been controlled at home running 120's/60's, he is on cardura 8 mg 1/2 tab QHS,  ziac 2.5/6.25 QD, enalapril 10 mg in the PM, and mindoxidil 2.42m qd, today their BP is BP: (!) 150/78 . Has BP log and average BP recording is <130/90.  Pulse has been mostly in the 50's, some low 60 values. He continues to follow with Cardiology. Denies dizziness. BP Readings from Last 3 Encounters:  12/21/21 (!) 150/78  12/17/21 120/68  09/13/21 (!) 152/68    BMI is Body mass index is 25.25 kg/m., he is working on diet and exercise. He is down 8 pounds from last visit, states he will lose weight in the summer due to yard work.  Eats regular meals with lots of vegetables from the garden. Wt Readings from Last 3 Encounters:  12/21/21 202 lb (91.6 kg)  12/17/21 201 lb 9.6 oz (91.4 kg)  09/13/21 201 lb 3.2 oz (91.3 kg)   He does not workout but works in the yard [employer] He denies chest pain, shortness of breath, dizziness.  He is on cholesterol medication, lipitor 10 mg  and denies myalgias. His cholesterol is at goal. The cholesterol last visit was:   Lab Results  Component Value Date   CHOL 172 09/03/2021   HDL 61 09/03/2021   LDLCALC 95 09/03/2021   TRIG 74 09/03/2021   CHOLHDL 2.8 09/03/2021   He has not been working on diet and exercise for prediabetes, and denies hyperglycemia, hypoglycemia , nausea, polydipsia, polyuria and vomiting. He has checked blood sugar irregularly and values are 90-114 at 6 am.  Last A1C in the office was:  Lab Results  Component Value Date   HGBA1C 5.6 08/12/2021     Last GFR Lab Results  Component Value  Date   GFRNONAA >60 08/12/2021    Patient is on Vitamin D supplement.   Lab Results  Component Value Date   VD25OH 83 09/03/2021      Medication Review:   Current Outpatient Medications (Cardiovascular):    atorvastatin (LIPITOR) 10 MG tablet, TAKE 1 TABLET BY MOUTH DAILY FOR CHOLESTEROL (Patient taking differently: Take 10 mg by mouth daily.)   bisoprolol-hydrochlorothiazide (ZIAC) 2.5-6.25 MG tablet, TAKE 1 TABLET BY MOUTH DAILY FOR BLOOD PRESSURE   doxazosin (CARDURA) 8 MG tablet, TAKE 1/2 TABLET BY MOUTH AT BEDTIME FOR BLOOD PRESSURE AND PROSTATE   enalapril (VASOTEC) 20 MG tablet, TAKE 1 TABLET BY MOUTH TWICE DAILY FOR BLOOD PRESSURE (Patient taking differently: Take 10 mg by mouth 2 (two) times daily.)   minoxidil (LONITEN) 2.5 MG tablet, TAKE 1-2 TABLETS BY MOUTH EVERY MORNING FOR BLOOD PRESSURE  Current Outpatient Medications (Respiratory):    cetirizine (ZYRTEC) 10 MG tablet, Take 10 mg by mouth daily as needed for allergies.  Current Outpatient Medications (Analgesics):    acetaminophen (TYLENOL) 500 MG tablet, Take 1,000 mg by mouth every 6 (six) hours as needed for mild pain.   allopurinol (ZYLOPRIM) 300 MG tablet, TAKE 1 TABLET BY MOUTH DAILY FOR GOUT (Patient taking differently: Take 150 mg by mouth  daily.)   aspirin 81 MG tablet, Take 81 mg by mouth daily.   Current Outpatient Medications (Other):    Alfalfa 500 MG TABS, Take 1,000 mg by mouth daily.   blood glucose meter kit and supplies, Dispense based insurance preference. E11.9   Cholecalciferol (VITAMIN D3) 2000 units TABS, Take 6,000 Units by mouth daily.   Cinnamon 500 MG TABS, Take 1,000 mg by mouth daily.   finasteride (PROSCAR) 5 MG tablet, Take 5 mg by mouth every evening.    Garlic 9798 MG CAPS, Take 1,000 mg by mouth daily.   glucose blood (ONE TOUCH ULTRA TEST) test strip, USE TO CHECK BLOOD SUGAR ONCE DAILY   glucose blood (ONETOUCH ULTRA) test strip, Check   Blood Sugar   Daily  (Dx: e11.9)    glucose blood test strip, Check blood sugar once daily   latanoprost (XALATAN) 0.005 % ophthalmic solution, Place 1 drop into both eyes daily.   Magnesium 250 MG TABS, Take 250 mg by mouth 2 (two) times daily. Breakfast and lunch   Multiple Vitamin (MULTIVITAMIN) tablet, Take 1 tablet by mouth daily.   Omega-3 Fatty Acids (OMEGA-3 FISH OIL) 1200 MG CAPS, Take 1,200 mg by mouth daily with lunch.   omeprazole (PRILOSEC) 20 MG capsule, TAKE 1 CAPSULE BY MOUTH DAILY (Patient taking differently: 20 mg daily.)   OneTouch Delica Lancets 92J MISC, Check blood sugar 1 time a day.   zinc gluconate 50 MG tablet, Take 50 mg by mouth daily.  Allergies: Allergies  Allergen Reactions   Adhesive [Tape] Other (See Comments)    reddness    Current Problems (verified) has Essential hypertension; Mixed hyperlipidemia; Prediabetes; Vitamin D deficiency; Benign prostatic hyperplasia; Idiopathic gout; Gastroesophageal reflux disease without esophagitis; Seasonal allergic rhinitis due to pollen; Syncope and collapse; History of malignant neoplasm of skin; Rosacea; and Urolithiasis on their problem list.  Screening Tests Immunization History  Administered Date(s) Administered   DTaP 08/31/2005   PFIZER(Purple Top)SARS-COV-2 Vaccination 06/14/2019, 07/04/2019   PPD Test 03/17/2014   Td 07/26/2017    Preventative care: Last colonoscopy: 2019 - no recall d/t age  Prior vaccinations: TD or Tdap: 2019 Influenza: Declined Pneumococcal: Declined Prevnar13: Declined Shingles/Zostavax: Declined  Eye exam: Dr. Sabra Heck 01/29/21 Dentist: Dr. Lear Ng 01/2021  Names of Other Physician/Practitioners you currently use: 1.  [facility] here for primary care   Patient Care Team: Unk Pinto, MD as PCP - General (Internal Medicine) Elouise Munroe, MD as PCP - Cardiology (Cardiology) [place], Jennefer Bravo, MD as Referring Physician (Dermatology) Gatha Mayer, MD as  Consulting Physician (Gastroenterology) Irine Seal, MD as Attending Physician (Urology) Marica Otter, [place] (Optometry)  Surgical: He  has a past surgical history that includes Prostate biopsy (2000, 12/2004); Extracorporeal shock wave lithotripsy [phone]); Hand ligament reconstruction (Right, 2009); Cystoscopy with retrograde pyelogram, ureteroscopy and stent placement (Bilateral, 04/19/2016); and Colonoscopy (2008). Family His family history includes Breast cancer in his sister; Cancer in his sister and sister; Colon cancer in his sister; Diabetes in his brother, brother, brother, father, mother, and sister; Heart attack in his father; Heart disease in his brother, father, and mother; Pancreatic cancer in his brother; Stroke in his brother. Social history  He reports that he quit smoking about 56 years ago. His smoking use included cigarettes. He quit smokeless tobacco use about 55 years ago.  His smokeless tobacco use included chew. He reports that he does not drink alcohol and does not use drugs.  MEDICARE WELLNESS OBJECTIVES: Physical  activity: Current Exercise Habits: Home exercise routine, Exercise limited by: cardiac condition(s) Cardiac risk factors: Cardiac Risk Factors include: advanced age (>88mn, >>46women);hypertension;male gender Depression/mood screen:      12/21/2021    9:58 AM  Depression screen PHQ 2/9  Decreased Interest 0  Down, Depressed, Hopeless 0  PHQ - 2 Score 0    ADLs:     12/21/2021    9:55 AM 08/12/2021    2:00 PM  In your present state of health, do you have any difficulty performing the following activities:  Hearing? 0 0  Vision? 0 0  Comment  reading glasses  Difficulty concentrating or making decisions? 0 0  Walking or climbing stairs? 0 0  Dressing or bathing? 0 0  Doing errands, shopping? 0 0  Preparing Food and eating ? N   Using the Toilet? N   In the past six months, have you accidently leaked urine? N   Do you have problems with loss  of bowel control? N   Managing your Medications? N   Managing your Finances? N   Housekeeping or managing your Housekeeping? N      Cognitive Testing  Alert? Yes  Normal Appearance?Yes  Oriented to person? Yes  Place? Yes   Time? Yes  Recall of three objects?  Yes  Can perform simple calculations? Yes  Displays appropriate judgment?Yes  Can read the correct time from a watch face?Yes  EOL planning: Does Patient Have a Medical Advance Directive?: No Would patient like information on creating a medical advance directive?: No - Patient declined  Review of Systems  Constitutional:  Negative for chills and fever.  HENT:  Negative for congestion, hearing loss, sinus pain, sore throat and tinnitus.   Eyes:  Negative for blurred vision and double vision.  Respiratory:  Negative for cough, hemoptysis, sputum production, shortness of breath and wheezing.   Cardiovascular:  Negative for chest pain, palpitations and leg swelling.  Gastrointestinal:  Negative for abdominal pain, constipation, diarrhea, heartburn, nausea and vomiting.  Genitourinary:  Negative for dysuria and urgency.  Musculoskeletal:  Negative for back pain, falls, joint pain, myalgias and neck pain.  Skin:  Negative for rash.  Neurological:  Positive for tingling (bottom of feet). Negative for dizziness, tremors, weakness and headaches.  Endo/Heme/Allergies:  Does not bruise/bleed easily.  Psychiatric/Behavioral:  Negative for depression and suicidal ideas. The patient is not nervous/anxious and does not have insomnia.     Objective:   Today's Vitals   12/21/21 0930  BP: (!) 150/78  Pulse: (!) 53  Temp: 97.7 F (36.5 C)  SpO2: 95%  Weight: 202 lb (91.6 kg)  Height: _0  (1.905 m)    Body mass index is 25.25 kg/m.  General appearance: alert, no distress, WD/WN, male HEENT: normocephalic, sclerae anicteric, TMs pearly, nares patent, no discharge or erythema, pharynx normal Oral cavity: MMM, no lesions Neck:  supple, no lymphadenopathy, no thyromegaly, no masses Heart: RRR, normal S1, S2, no murmurs Lungs: CTA bilaterally, no wheezes, rhonchi, or rales Abdomen: +bs, soft, non tender, non distended, no masses, no hepatomegaly, no splenomegaly Musculoskeletal: nontender, no swelling, no obvious deformity Extremities: no edema, no cyanosis, no clubbing Pulses: 2+ symmetric, upper and lower extremities, normal cap refill Neurological: alert, oriented x 3, CN2-12 intact, strength normal upper extremities and lower extremities, sensation normal throughout, DTRs 2+ throughout, no cerebellar signs, gait normal Psychiatric: normal affect, behavior normal, pleasant   Medicare Attestation I have personally reviewed: The patient's medical and social history  Their use of alcohol, tobacco or illicit drugs Their current medications and supplements The patient's functional ability including ADLs,fall risks, home safety risks, cognitive, and hearing and visual impairment Diet and physical activities Evidence for depression or mood disorders  The patient's weight, height, BMI, and visual acuity have been recorded in the chart.  I have made referrals, counseling, and provided education to the patient based on review of the above and I have provided the patient with a written personalized care plan for preventive services.      Darrol Jump, NP [facility]  12/21/2021

## 2021-12-22 LAB — COMPLETE METABOLIC PANEL WITH GFR
AG Ratio: 2.1 (calc) (ref 1.0–2.5)
ALT: 19 U/L (ref 9–46)
AST: 21 U/L (ref 10–35)
Albumin: 4.2 g/dL (ref 3.6–5.1)
Alkaline phosphatase (APISO): 58 U/L (ref 35–144)
BUN: 19 mg/dL (ref 7–25)
CO2: 31 mmol/L (ref 20–32)
Calcium: 9.4 mg/dL (ref 8.6–10.3)
Chloride: 102 mmol/L (ref 98–110)
Creat: 0.78 mg/dL (ref 0.70–1.22)
Globulin: 2 g/dL (calc) (ref 1.9–3.7)
Glucose, Bld: 102 mg/dL — ABNORMAL HIGH (ref 65–99)
Potassium: 4.2 mmol/L (ref 3.5–5.3)
Sodium: 140 mmol/L (ref 135–146)
Total Bilirubin: 0.6 mg/dL (ref 0.2–1.2)
Total Protein: 6.2 g/dL (ref 6.1–8.1)
eGFR: 90 mL/min/{1.73_m2} (ref >=60)

## 2021-12-22 LAB — CBC WITH DIFFERENTIAL/PLATELET
Absolute Monocytes: 476 cells/uL (ref 200–950)
Basophils Absolute: 41 cells/uL (ref 0–200)
Basophils Relative: 0.7 %
Eosinophils Absolute: 58 cells/uL (ref 15–500)
Eosinophils Relative: 1 %
HCT: 39.7 % (ref 38.5–50.0)
Hemoglobin: 13.9 g/dL (ref 13.2–17.1)
Lymphs Abs: 1340 cells/uL (ref 850–3900)
MCH: 32.3 pg (ref 27.0–33.0)
MCHC: 35 g/dL (ref 32.0–36.0)
MCV: 92.3 fL (ref 80.0–100.0)
MPV: 10.3 fL (ref 7.5–12.5)
Monocytes Relative: 8.2 %
Neutro Abs: 3886 cells/uL (ref 1500–7800)
Neutrophils Relative %: 67 %
Platelets: 197 10*3/uL (ref 140–400)
RBC: 4.3 10*6/uL (ref 4.20–5.80)
RDW: 12.3 % (ref 11.0–15.0)
Total Lymphocyte: 23.1 %
WBC: 5.8 10*3/uL (ref 3.8–10.8)

## 2021-12-23 DIAGNOSIS — M5030 Other cervical disc degeneration, unspecified cervical region: Secondary | ICD-10-CM | POA: Diagnosis not present

## 2021-12-23 DIAGNOSIS — M542 Cervicalgia: Secondary | ICD-10-CM | POA: Diagnosis not present

## 2021-12-23 DIAGNOSIS — S46811A Strain of other muscles, fascia and tendons at shoulder and upper arm level, right arm, initial encounter: Secondary | ICD-10-CM | POA: Diagnosis not present

## 2021-12-30 DIAGNOSIS — M542 Cervicalgia: Secondary | ICD-10-CM | POA: Diagnosis not present

## 2021-12-30 DIAGNOSIS — S46811D Strain of other muscles, fascia and tendons at shoulder and upper arm level, right arm, subsequent encounter: Secondary | ICD-10-CM | POA: Diagnosis not present

## 2022-01-03 DIAGNOSIS — M542 Cervicalgia: Secondary | ICD-10-CM | POA: Diagnosis not present

## 2022-01-03 DIAGNOSIS — S46811D Strain of other muscles, fascia and tendons at shoulder and upper arm level, right arm, subsequent encounter: Secondary | ICD-10-CM | POA: Diagnosis not present

## 2022-01-11 DIAGNOSIS — S46811D Strain of other muscles, fascia and tendons at shoulder and upper arm level, right arm, subsequent encounter: Secondary | ICD-10-CM | POA: Diagnosis not present

## 2022-01-11 DIAGNOSIS — M542 Cervicalgia: Secondary | ICD-10-CM | POA: Diagnosis not present

## 2022-01-13 DIAGNOSIS — M542 Cervicalgia: Secondary | ICD-10-CM | POA: Diagnosis not present

## 2022-01-13 DIAGNOSIS — M25511 Pain in right shoulder: Secondary | ICD-10-CM | POA: Diagnosis not present

## 2022-01-13 DIAGNOSIS — M5030 Other cervical disc degeneration, unspecified cervical region: Secondary | ICD-10-CM | POA: Diagnosis not present

## 2022-01-17 DIAGNOSIS — M542 Cervicalgia: Secondary | ICD-10-CM | POA: Diagnosis not present

## 2022-01-17 DIAGNOSIS — S46811D Strain of other muscles, fascia and tendons at shoulder and upper arm level, right arm, subsequent encounter: Secondary | ICD-10-CM | POA: Diagnosis not present

## 2022-01-25 DIAGNOSIS — M542 Cervicalgia: Secondary | ICD-10-CM | POA: Diagnosis not present

## 2022-01-25 DIAGNOSIS — S46811D Strain of other muscles, fascia and tendons at shoulder and upper arm level, right arm, subsequent encounter: Secondary | ICD-10-CM | POA: Diagnosis not present

## 2022-01-31 DIAGNOSIS — L570 Actinic keratosis: Secondary | ICD-10-CM | POA: Diagnosis not present

## 2022-01-31 DIAGNOSIS — S46811D Strain of other muscles, fascia and tendons at shoulder and upper arm level, right arm, subsequent encounter: Secondary | ICD-10-CM | POA: Diagnosis not present

## 2022-01-31 DIAGNOSIS — M542 Cervicalgia: Secondary | ICD-10-CM | POA: Diagnosis not present

## 2022-02-02 DIAGNOSIS — M542 Cervicalgia: Secondary | ICD-10-CM | POA: Diagnosis not present

## 2022-02-02 DIAGNOSIS — S46811D Strain of other muscles, fascia and tendons at shoulder and upper arm level, right arm, subsequent encounter: Secondary | ICD-10-CM | POA: Diagnosis not present

## 2022-02-08 DIAGNOSIS — S46811D Strain of other muscles, fascia and tendons at shoulder and upper arm level, right arm, subsequent encounter: Secondary | ICD-10-CM | POA: Diagnosis not present

## 2022-02-08 DIAGNOSIS — M542 Cervicalgia: Secondary | ICD-10-CM | POA: Diagnosis not present

## 2022-02-08 DIAGNOSIS — K142 Median rhomboid glossitis: Secondary | ICD-10-CM | POA: Diagnosis not present

## 2022-02-10 DIAGNOSIS — S46811D Strain of other muscles, fascia and tendons at shoulder and upper arm level, right arm, subsequent encounter: Secondary | ICD-10-CM | POA: Diagnosis not present

## 2022-02-10 DIAGNOSIS — K142 Median rhomboid glossitis: Secondary | ICD-10-CM | POA: Diagnosis not present

## 2022-02-10 DIAGNOSIS — M542 Cervicalgia: Secondary | ICD-10-CM | POA: Diagnosis not present

## 2022-02-15 DIAGNOSIS — M542 Cervicalgia: Secondary | ICD-10-CM | POA: Diagnosis not present

## 2022-02-15 DIAGNOSIS — S46811D Strain of other muscles, fascia and tendons at shoulder and upper arm level, right arm, subsequent encounter: Secondary | ICD-10-CM | POA: Diagnosis not present

## 2022-02-15 DIAGNOSIS — K142 Median rhomboid glossitis: Secondary | ICD-10-CM | POA: Diagnosis not present

## 2022-02-25 DIAGNOSIS — H52223 Regular astigmatism, bilateral: Secondary | ICD-10-CM | POA: Diagnosis not present

## 2022-02-25 DIAGNOSIS — H401131 Primary open-angle glaucoma, bilateral, mild stage: Secondary | ICD-10-CM | POA: Diagnosis not present

## 2022-02-25 DIAGNOSIS — H524 Presbyopia: Secondary | ICD-10-CM | POA: Diagnosis not present

## 2022-02-25 DIAGNOSIS — H5213 Myopia, bilateral: Secondary | ICD-10-CM | POA: Diagnosis not present

## 2022-03-07 ENCOUNTER — Other Ambulatory Visit: Payer: Self-pay | Admitting: Nurse Practitioner

## 2022-03-07 DIAGNOSIS — I10 Essential (primary) hypertension: Secondary | ICD-10-CM

## 2022-03-30 DIAGNOSIS — N401 Enlarged prostate with lower urinary tract symptoms: Secondary | ICD-10-CM | POA: Diagnosis not present

## 2022-03-30 DIAGNOSIS — R351 Nocturia: Secondary | ICD-10-CM | POA: Diagnosis not present

## 2022-03-30 DIAGNOSIS — N200 Calculus of kidney: Secondary | ICD-10-CM | POA: Diagnosis not present

## 2022-03-30 DIAGNOSIS — R972 Elevated prostate specific antigen [PSA]: Secondary | ICD-10-CM | POA: Diagnosis not present

## 2022-04-12 ENCOUNTER — Encounter: Payer: Self-pay | Admitting: Internal Medicine

## 2022-04-12 NOTE — Progress Notes (Unsigned)
Annual  Screening/Preventative Visit  & Comprehensive Evaluation & Examination   Future Appointments  Date Time Provider Department  04/13/2022 10:00 AM Unk Pinto, MD [facility]  10/12/2022 11:00 AM Darrol Jump, NP [facility]  04/18/2023 10:00 AM Unk Pinto, MD [facility]                                              This very nice 81 y.o. [name] with HTN, HLD, Prediabetes and Vitamin D Deficiency who presents for a Screening /Preventative Visit & comprehensive evaluation and management of multiple medical co-morbidities.   Patient's Gout is  controlled on Allopurinol Patient also has GERD controlled with diet & Prilosec. Patient has Obs BPH with LUTS controlled on his Doxazosin/Finastertide.       HTN predates circa 1986. Patient's BP has been controlled at home.  Today's BP is at goal - 138/70 . Patient denies any cardiac symptoms as chest pain, palpitations, shortness of breath, dizziness or ankle swelling.       Patient's hyperlipidemia is controlled with diet and Atorvastatin. Patient denies myalgias or other medication SE's. Last lipids were at goal:  Lab Results  Component Value Date   CHOL 172 09/03/2021   HDL 61 09/03/2021   LDLCALC 95 09/03/2021   TRIG 74 09/03/2021   CHOLHDL 2.8 09/03/2021        Patient has hx/o prediabetes (A1c 5.8% /2006) and patient denies reactive hypoglycemic symptoms, visual blurring, diabetic polys or paresthesias. Last A1c was normal & at goal:   Lab Results  Component Value Date   HGBA1C 5.6 08/12/2021        Finally, patient has history of Vitamin D Deficiency ("49"on tx /2008) and last vitamin D was at goal:  Lab Results  Component Value Date   VD25OH 83 09/03/2021       Current Outpatient Medications  Medication Instructions   acetaminophen   1,000 mg Every 6 hours PRN   Alfalfa  1,000 mg Daily   allopurinol  300 MG tablet TAKE 1 TABLET  DAILY FOR GOUT   aspirin 81 mg, Oral, Daily   atorvastatin 10 MG  tablet TAKE 1 TABLET  DAILY    bisoprolol-hctz 2.5-6.25 MG tablet TAKE 1 TABLET  DAILY   cetirizine  10 mg Daily PRN   Cinnamon  1,000 mg Daily   doxazosin 8 MG tablet TAKE 1/2 TABLET AT BEDTIME     enalapril  20 MG tablet TAKE 1 TABLET  TWICE DAILY    finasteride   5 mg Every evening   Garlic  6,269 mg  Daily   XALATAN 0.005 % ophth soln 1 drop, Both Eyes, Daily   Magnesium 250 mg 2 times daily, Breakfast and lunch   minoxidil 2.5 MG tablet TAKE 1-2 TABLETS EVERY MORNING    Multiple Vitamin  1 tablet  Daily   Omega-3 Fish Oil 1,200 mg Daily with lunch   omeprazole  20 MG capsule TAKE 1 CAPSULE  DAILY   Vitamin D  6,000 Units Daily   zinc 50 mg Daily     Allergies  Allergen Reactions   Adhesive [Tape] Other (See Comments)    reddness     Past Medical History:  Diagnosis Date   Benign localized prostatic hyperplasia with lower urinary tract symptoms (LUTS)    Bruises easily    Elevated PSA  GERD (gastroesophageal reflux disease)    History of gout    History of kidney stones    Hyperlipidemia    Hypertension    Nephrolithiasis    Nocturia    Pre-diabetes    Vitamin D deficiency    Wears dentures    full upper and lower partial     Health Maintenance  Topic Date Due   Hepatitis C Screening  Never done   COVID-19 Vaccine (3 - Booster for Pfizer series) 01/01/2020   TETANUS/TDAP  07/27/2027   HPV VACCINES  Aged Out   INFLUENZA VACCINE  Discontinued   PNA vac Low Risk Adult  Discontinued     Immunization History  Administered Date(s) Administered   DTaP 08/31/2005   PFIZER SARS-COV-2 Vacc 06/14/2019, 07/04/2019   PPD Test 03/17/2014   Td 07/26/2017    Last Colon - 12/26/2017 - Negative & Dr Carlean Purl- recc no f/u due to age.   Past Surgical History:  Procedure Laterality Date   COLONOSCOPY  2008   CYSTOSCOPY WITH RETROGRADE PYELOGRAM, URETEROSCOPY AND STENT PLACEMENT Bilateral 04/19/2016   Procedure: CYSTOSCOPY WITH BILATERAL RETROGRADE URETEROSCOPY  BASKET EXTRACTION;  Surgeon: Irine Seal, MD;  Location: [facility];  Service: Urology;  Laterality: Bilateral;   EXTRACORPOREAL SHOCK WAVE LITHOTRIPSY  [phone]   HAND LIGAMENT RECONSTRUCTION Right 2009   PROSTATE BIOPSY  2000, 12/2004   negative     Family History  Problem Relation Age of Onset   Heart disease Mother    Diabetes Mother    Diabetes Father    Heart disease Father    Heart attack Father    Diabetes Sister    Colon cancer Sister    Diabetes Brother    Pancreatic cancer Brother    Diabetes Brother    Diabetes Brother    Heart disease Brother    Stroke Brother    Cancer Sister        breast   Breast cancer Sister    Cancer Sister        colon   Stomach cancer Neg Hx      Social History   Socioeconomic History   Marital status: Married    Spouse name: Enid Derry   Number of children: None  Occupational History   Not on file  Tobacco Use   Smoking status: Former Smoker    Years: 5.00    Types: Cigarettes    Quit date: 05/23/1965    Years since quitting: 55.3   Smokeless tobacco: Former Systems developer    Types: Chew    Quit date: 04/11/1966  Vaping Use   Vaping Use: Never used  Substance and Sexual Activity   Alcohol use: No   Drug use: No   Sexual activity: Not on file  Social History Narrative   The patient is married without children he worked as a Scientist, 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   1 caffeinated beverage daily.   Former user of smokeless tobacco and former smoker - no current smoking drug use or alcohol     ROS Constitutional: Denies fever, chills, weight loss/gain, headaches, insomnia,  night sweats or change in appetite. Does c/o fatigue. Eyes: Denies redness, blurred vision, diplopia, discharge, itchy or watery eyes.  ENT: Denies discharge, congestion, post nasal drip, epistaxis, sore throat, earache, hearing loss, dental pain, Tinnitus, Vertigo, Sinus pain or snoring.  Cardio:  Denies chest pain, palpitations, irregular heartbeat, syncope, dyspnea, diaphoresis, orthopnea, PND,  claudication or edema Respiratory: denies cough, dyspnea, DOE, pleurisy, hoarseness, laryngitis or wheezing.  Gastrointestinal: Denies dysphagia, heartburn, reflux, water brash, pain, cramps, nausea, vomiting, bloating, diarrhea, constipation, hematemesis, melena, hematochezia, jaundice or hemorrhoids Genitourinary: Denies dysuria, frequency, urgency, nocturia, hesitancy, discharge, hematuria or flank pain Musculoskeletal: Denies arthralgia, myalgia, stiffness, Jt. Swelling, pain, limp or strain/sprain. Denies Falls. Skin: Denies puritis, rash, hives, warts, acne, eczema or change in skin lesion Neuro: No weakness, tremor, incoordination, spasms, paresthesia or pain Psychiatric: Denies confusion, memory loss or sensory loss. Denies Depression. Endocrine: Denies change in weight, skin, hair change, nocturia, and paresthesia, diabetic polys, visual blurring or hyper / hypo glycemic episodes.  Heme/Lymph: No excessive bleeding, bruising or enlarged lymph nodes.  Physical Exam  BP 138/70   Pulse (!) 57   Temp 97.8 F (36.6 C)   Resp 17   Ht '6\' 3"'$  (1.905 m)   Wt 203 lb (92.1 kg)   SpO2 98%   BMI 25.37 kg/m   General Appearance: Well nourished and well groomed and in no apparent distress.  Eyes: PERRLA, EOMs, conjunctiva no swelling or erythema, normal fundi and vessels. Sinuses: No frontal/maxillary tenderness ENT/Mouth: EACs patent / TMs  nl. Nares clear without erythema, swelling, mucoid exudates. Oral hygiene is good. No erythema, swelling, or exudate. Tongue normal, non-obstructing. Tonsils not swollen or erythematous. Hearing normal.  Neck: Supple, thyroid not palpable. No bruits, nodes or JVD. Respiratory: Respiratory effort normal.  BS equal and clear bilateral without rales, rhonci, wheezing or stridor. Cardio: Heart sounds are normal with regular rate and rhythm and no murmurs, rubs  or gallops. Peripheral pulses are normal and equal bilaterally without edema. No aortic or femoral bruits. Chest: symmetric with normal excursions and percussion.  Abdomen: Soft, with Nl bowel sounds. Nontender, no guarding, rebound, hernias, masses, or organomegaly.  Lymphatics: Non tender without lymphadenopathy.  Musculoskeletal: Full ROM all peripheral extremities, joint stability, 5/5 strength, and normal gait. Skin: Warm and dry without rashes, lesions, cyanosis, clubbing or  ecchymosis.  Neuro: Cranial nerves intact, reflexes equal bilaterally. Normal muscle tone, no cerebellar symptoms. Sensation intact.  Pysch: Alert and oriented X 3 with normal affect, insight and judgment appropriate.   Assessment and Plan  1. Annual Preventative/Screening Exam    2. Essential hypertension  - EKG 12-Lead - [place], RETROPERITNL ABD,  LTD - Urinalysis, Routine w reflex microscopic - Microalbumin / creatinine urine ratio - CBC with Differential/Platelet - COMPLETE METABOLIC PANEL WITH GFR - Magnesium - TSH  3. Hyperlipidemia, mixed  - EKG 12-Lead - [place], RETROPERITNL ABD,  LTD - Lipid panel - TSH  4. Abnormal glucose  - EKG 12-Lead - [place], RETROPERITNL ABD,  LTD - Hemoglobin A1c - Insulin, random  5. Vitamin D deficiency  - VITAMIN D 25 Hydroxyl 6. Gout  - Uric acid  7. BPH with obstruction/lower urinary tract symptoms  - PSA  8. Prostate cancer screening  - PSA  9. Screening for colorectal cancer  - POC Hemoccult Bld/Stl   10. Gastroesophageal reflux disease  - CBC with Differential/Platelet  11. Screening for ischemic heart disease  - EKG 12-Lead  12. FHx: heart disease  - EKG 12-Lead - [place], RETROPERITNL ABD,  LTD  13. Former smoker  - EKG 12-Lead - [place], RETROPERITNL ABD,  LTD  14. Screening for AAA (aortic abdominal aneurysm)  - [place], RETROPERITNL ABD,  LTD  15. Medication management  - Urinalysis, Routine w reflex microscopic - Microalbumin /  creatinine urine ratio - Uric acid - CBC  with Differential/Platelet - COMPLETE METABOLIC PANEL WITH GFR - Magnesium - Lipid panel - TSH - Hemoglobin A1c - Insulin, random - VITAMIN D 25 Hydroxyl          Patient was counseled in prudent diet, weight control to achieve/maintain BMI less than 25, BP monitoring, regular exercise and medications as discussed.  Discussed med effects and SE's. Routine screening labs and tests as requested with regular follow-up as recommended. Over 40 minutes of exam, counseling, chart review and high complex critical decision making was performed   Kirtland Bouchard, MD

## 2022-04-12 NOTE — Patient Instructions (Signed)

## 2022-04-13 ENCOUNTER — Encounter: Payer: Self-pay | Admitting: Internal Medicine

## 2022-04-13 ENCOUNTER — Ambulatory Visit (INDEPENDENT_AMBULATORY_CARE_PROVIDER_SITE_OTHER): Payer: PPO | Admitting: Internal Medicine

## 2022-04-13 VITALS — BP 138/70 | HR 57 | Temp 97.8°F | Resp 17 | Ht 75.0 in | Wt 203.0 lb

## 2022-04-13 DIAGNOSIS — Z87891 Personal history of nicotine dependence: Secondary | ICD-10-CM

## 2022-04-13 DIAGNOSIS — Z1211 Encounter for screening for malignant neoplasm of colon: Secondary | ICD-10-CM

## 2022-04-13 DIAGNOSIS — Z0000 Encounter for general adult medical examination without abnormal findings: Secondary | ICD-10-CM

## 2022-04-13 DIAGNOSIS — I700 Atherosclerosis of aorta: Secondary | ICD-10-CM

## 2022-04-13 DIAGNOSIS — E782 Mixed hyperlipidemia: Secondary | ICD-10-CM | POA: Diagnosis not present

## 2022-04-13 DIAGNOSIS — Z8249 Family history of ischemic heart disease and other diseases of the circulatory system: Secondary | ICD-10-CM

## 2022-04-13 DIAGNOSIS — Z136 Encounter for screening for cardiovascular disorders: Secondary | ICD-10-CM

## 2022-04-13 DIAGNOSIS — E559 Vitamin D deficiency, unspecified: Secondary | ICD-10-CM

## 2022-04-13 DIAGNOSIS — N401 Enlarged prostate with lower urinary tract symptoms: Secondary | ICD-10-CM

## 2022-04-13 DIAGNOSIS — Z79899 Other long term (current) drug therapy: Secondary | ICD-10-CM | POA: Diagnosis not present

## 2022-04-13 DIAGNOSIS — I10 Essential (primary) hypertension: Secondary | ICD-10-CM | POA: Diagnosis not present

## 2022-04-13 DIAGNOSIS — Z0001 Encounter for general adult medical examination with abnormal findings: Secondary | ICD-10-CM

## 2022-04-13 DIAGNOSIS — M109 Gout, unspecified: Secondary | ICD-10-CM | POA: Diagnosis not present

## 2022-04-13 DIAGNOSIS — Z125 Encounter for screening for malignant neoplasm of prostate: Secondary | ICD-10-CM | POA: Diagnosis not present

## 2022-04-13 DIAGNOSIS — R7309 Other abnormal glucose: Secondary | ICD-10-CM

## 2022-04-13 DIAGNOSIS — K219 Gastro-esophageal reflux disease without esophagitis: Secondary | ICD-10-CM

## 2022-04-14 LAB — VITAMIN D 25 HYDROXY (VIT D DEFICIENCY, FRACTURES): Vit D, 25-Hydroxy: 80 ng/mL (ref 30–100)

## 2022-04-14 LAB — LIPID PANEL
Cholesterol: 167 mg/dL (ref <200)
HDL: 57 mg/dL (ref >=40)
LDL Cholesterol (Calc): 93 mg/dL (calc)
Non-HDL Cholesterol (Calc): 110 mg/dL (calc) (ref <130)
Total CHOL/HDL Ratio: 2.9 (calc) (ref <5.0)
Triglycerides: 81 mg/dL (ref <150)

## 2022-04-14 LAB — COMPLETE METABOLIC PANEL WITH GFR
AG Ratio: 2 (calc) (ref 1.0–2.5)
ALT: 27 U/L (ref 9–46)
AST: 26 U/L (ref 10–35)
Albumin: 4.5 g/dL (ref 3.6–5.1)
Alkaline phosphatase (APISO): 73 U/L (ref 35–144)
BUN: 19 mg/dL (ref 7–25)
CO2: 31 mmol/L (ref 20–32)
Calcium: 9.9 mg/dL (ref 8.6–10.3)
Chloride: 100 mmol/L (ref 98–110)
Creat: 0.82 mg/dL (ref 0.70–1.22)
Globulin: 2.3 g/dL (calc) (ref 1.9–3.7)
Glucose, Bld: 93 mg/dL (ref 65–99)
Potassium: 4.2 mmol/L (ref 3.5–5.3)
Sodium: 139 mmol/L (ref 135–146)
Total Bilirubin: 0.7 mg/dL (ref 0.2–1.2)
Total Protein: 6.8 g/dL (ref 6.1–8.1)
eGFR: 88 mL/min/{1.73_m2} (ref >=60)

## 2022-04-14 LAB — CBC WITH DIFFERENTIAL/PLATELET
Absolute Monocytes: 551 cells/uL (ref 200–950)
Basophils Absolute: 48 cells/uL (ref 0–200)
Basophils Relative: 0.7 %
Eosinophils Absolute: 129 cells/uL (ref 15–500)
Eosinophils Relative: 1.9 %
HCT: 43 % (ref 38.5–50.0)
Hemoglobin: 14.8 g/dL (ref 13.2–17.1)
Lymphs Abs: 1707 cells/uL (ref 850–3900)
MCH: 32.4 pg (ref 27.0–33.0)
MCHC: 34.4 g/dL (ref 32.0–36.0)
MCV: 94.1 fL (ref 80.0–100.0)
MPV: 10 fL (ref 7.5–12.5)
Monocytes Relative: 8.1 %
Neutro Abs: 4366 cells/uL (ref 1500–7800)
Neutrophils Relative %: 64.2 %
Platelets: 218 10*3/uL (ref 140–400)
RBC: 4.57 10*6/uL (ref 4.20–5.80)
RDW: 12.4 % (ref 11.0–15.0)
Total Lymphocyte: 25.1 %
WBC: 6.8 10*3/uL (ref 3.8–10.8)

## 2022-04-14 LAB — URINALYSIS, ROUTINE W REFLEX MICROSCOPIC
Bilirubin Urine: NEGATIVE
Glucose, UA: NEGATIVE
Hgb urine dipstick: NEGATIVE
Ketones, ur: NEGATIVE
Leukocytes,Ua: NEGATIVE
Nitrite: NEGATIVE
Protein, ur: NEGATIVE
Specific Gravity, Urine: 1.011 (ref 1.001–1.035)
pH: 7 (ref 5.0–8.0)

## 2022-04-14 LAB — HEMOGLOBIN A1C
Hgb A1c MFr Bld: 5.7 % of total Hgb — ABNORMAL HIGH (ref <5.7)
Mean Plasma Glucose: 117 mg/dL
eAG (mmol/L): 6.5 mmol/L

## 2022-04-14 LAB — MICROALBUMIN / CREATININE URINE RATIO
Creatinine, Urine: 50 mg/dL (ref 20–320)
Microalb Creat Ratio: 8 mcg/mg creat (ref <30)
Microalb, Ur: 0.4 mg/dL

## 2022-04-14 LAB — PSA: PSA: 0.95 ng/mL (ref <=4.00)

## 2022-04-14 LAB — URIC ACID: Uric Acid, Serum: 5.7 mg/dL (ref 4.0–8.0)

## 2022-04-14 LAB — TSH: TSH: 1.23 mIU/L (ref 0.40–4.50)

## 2022-04-14 LAB — MAGNESIUM: Magnesium: 2 mg/dL (ref 1.5–2.5)

## 2022-04-14 LAB — INSULIN, RANDOM: Insulin: 8.4 u[IU]/mL

## 2022-04-15 NOTE — Progress Notes (Signed)
<><><><><><><><><><><><><><><><><><><><><><><><><><><><><><><><><> <><><><><><><><><><><><><><><><><><><><><><><><><><><><><><><><><> - Test results slightly outside the reference range are not unusual. If there is anything important, I will review this with you,  otherwise it is considered normal test values.  If you have further questions,  please do not hesitate to contact me at the office or via My Chart.  <><><><><><><><><><><><><><><><><><><><><><><><><><><><><><><><><> <><><><><><><><><><><><><><><><><><><><><><><><><><><><><><><><><>  -  A1c = 5.7% is now  elevated in the borderline and early or pre-diabetes   range which has the same 300% increased risk for   heart attack, stroke, cancer and alzheimer- type vascular dementia as                                                                                                        full blown diabetes.   But the good news is that diet, exercise with weight loss can                                                                               cure the early diabetes at this point.  <><><><><><><><><><><><><><><><><><><><><><><><><><><><><><><><><> <><><><><><><><><><><><><><><><><><><><><><><><><><><><><><><><><>  -    It is very important that you work harder with diet by                                     avoiding all foods that are white except chicken, fish & calliflower.  - Avoid white rice  (brown & wild rice is OK),   - Avoid white potatoes  (sweet potatoes in moderation is OK),   White bread or wheat bread or anything made out of                                                 white flour like bagels, donuts, rolls, buns, biscuits, cakes,  - pastries, cookies, pizza crust, and pasta (made from white flour & egg whites)   - vegetarian pasta or spinach or wheat pasta is OK.  - Multigrain breads like Arnold's, Pepperidge Farm or   multigrain sandwich thins or high fiber breads like                                                   [place]ka bread or "Dave's Killer" breads that are  4 to 5 grams fiber per slice !  are best.    Diet, exercise and weight loss can reverse and cure diabetes in the early stages.                                                                          ( Only  if you eat better & lose Weight !  )  - Diet, exercise and weight loss is very important in the   control and prevention of complications of diabetes which  affects every system in your body, ie.    -  Brain - dementia/stroke,  -  eyes - glaucoma/blindness,  -  heart - heart attack/heart failure,  -  kidneys - dialysis,  -  stomach - gastric paralysis,  -  intestines - malabsorption,  -  nerves - severe painful neuritis,  -  circulation - gangrene & loss of a leg(s)  -  and finally  . . . . . . . . . . . . . . . . . .                                  - cancer and Alzheimers. <><><><><><><><><><><><><><><><><><><><><><><><><><><><><><><><><> <><><><><><><><><><><><><><><><><><><><><><><><><><><><><><><><><>  -  PSA is very Low  - Great  !   No Prostate Cancer ! <><><><><><><><><><><><><><><><><><><><><><><><><><><><><><><><><>  -  Total Chol = 167    &   LDL Chol = 93  - Both  Excellent   - Very low risk for Heart Attack  / Stroke <><><><><><><><><><><><><><><><><><><><><><><><><><><><><><><><><>  -  Vitamin D = 80  - Excellent  !   -   Please Keep Dose Same  . <><><><><><><><><><><><><><><><><><><><><><><><><><><><><><><><><>  -  Uric Acid  / Gout Test is Normal & OK <><><><><><><><><><><><><><><><><><><><><><><><><><><><><><><><><> <><><><><><><><><><><><><><><><><><><><><><><><><><><><><><><><><>  -  All Else - CBC - Kidneys - Electrolytes - Liver - Magnesium & Thyroid    - all  Normal /  OK  <><><><><><><><><><><><><><><><><><><><><><><><><><><><><><><><><> <><><><><><><><><><><><><><><><><><><><><><><><><><><><><><><><><>

## 2022-05-05 ENCOUNTER — Other Ambulatory Visit: Payer: Self-pay | Admitting: Internal Medicine

## 2022-05-05 DIAGNOSIS — I10 Essential (primary) hypertension: Secondary | ICD-10-CM

## 2022-05-05 MED ORDER — BISOPROLOL-HYDROCHLOROTHIAZIDE 2.5-6.25 MG PO TABS: ORAL_TABLET | ORAL | 0 refills | Status: DC

## 2022-05-09 ENCOUNTER — Other Ambulatory Visit: Payer: Self-pay | Admitting: Nurse Practitioner

## 2022-05-24 ENCOUNTER — Other Ambulatory Visit: Payer: Self-pay | Admitting: Nurse Practitioner

## 2022-05-27 DIAGNOSIS — H401131 Primary open-angle glaucoma, bilateral, mild stage: Secondary | ICD-10-CM | POA: Diagnosis not present

## 2022-05-27 DIAGNOSIS — H524 Presbyopia: Secondary | ICD-10-CM | POA: Diagnosis not present

## 2022-05-27 DIAGNOSIS — H5213 Myopia, bilateral: Secondary | ICD-10-CM | POA: Diagnosis not present

## 2022-05-27 DIAGNOSIS — H52223 Regular astigmatism, bilateral: Secondary | ICD-10-CM | POA: Diagnosis not present

## 2022-05-27 DIAGNOSIS — H4010X1 Unspecified open-angle glaucoma, mild stage: Secondary | ICD-10-CM | POA: Diagnosis not present

## 2022-05-30 ENCOUNTER — Other Ambulatory Visit: Payer: Self-pay | Admitting: Nurse Practitioner

## 2022-06-07 ENCOUNTER — Other Ambulatory Visit: Payer: Self-pay

## 2022-06-07 DIAGNOSIS — Z1211 Encounter for screening for malignant neoplasm of colon: Secondary | ICD-10-CM

## 2022-06-07 LAB — POC HEMOCCULT BLD/STL (HOME/3-CARD/SCREEN)
Card #2 Fecal Occult Blod, POC: NEGATIVE
Card #3 Fecal Occult Blood, POC: NEGATIVE
Fecal Occult Blood, POC: NEGATIVE

## 2022-06-08 DIAGNOSIS — D485 Neoplasm of uncertain behavior of skin: Secondary | ICD-10-CM | POA: Diagnosis not present

## 2022-06-08 DIAGNOSIS — Z1212 Encounter for screening for malignant neoplasm of rectum: Secondary | ICD-10-CM | POA: Diagnosis not present

## 2022-06-08 DIAGNOSIS — Z1211 Encounter for screening for malignant neoplasm of colon: Secondary | ICD-10-CM | POA: Diagnosis not present

## 2022-06-08 DIAGNOSIS — L570 Actinic keratosis: Secondary | ICD-10-CM | POA: Diagnosis not present

## 2022-06-08 DIAGNOSIS — D0439 Carcinoma in situ of skin of other parts of face: Secondary | ICD-10-CM | POA: Diagnosis not present

## 2022-06-17 ENCOUNTER — Other Ambulatory Visit: Payer: Self-pay | Admitting: Nurse Practitioner

## 2022-06-17 DIAGNOSIS — K219 Gastro-esophageal reflux disease without esophagitis: Secondary | ICD-10-CM

## 2022-06-20 ENCOUNTER — Encounter: Payer: Self-pay | Admitting: Internal Medicine

## 2022-06-20 ENCOUNTER — Ambulatory Visit: Payer: PPO | Attending: Internal Medicine | Admitting: Internal Medicine

## 2022-06-20 VITALS — BP 142/76 | HR 61 | Ht 75.5 in | Wt 208.2 lb

## 2022-06-20 DIAGNOSIS — I10 Essential (primary) hypertension: Secondary | ICD-10-CM | POA: Diagnosis not present

## 2022-06-20 NOTE — Progress Notes (Signed)
Cardiology Office Note:    Date:  06/20/2022   ID:  Michael Waller, DOB 03-27-1941, MRN [id number]  PCP:  Unk Pinto, MD  Cardiologist:  Elouise Munroe, MD  Electrophysiologist:  None   Referring MD: Unk Pinto, MD   Chief Complaint/Reason for Referral: Hospital follow-up  History of Present Illness:    Michael Waller is a 83 y.o. male with a history of hypertension, hyperlipidemia, GERD, gout, and nephrolithiasis, here for follow-up.  On 08/12/2021 he was admitted to the hospital following a syncopal episode with a 5 minute loss of consciousness. At the time he was sitting in front of the computer. A 2D echo showed no wall motion abnormalities EF of 60%, and telemetry remained sinus rhythm 65-70 bpm. After cardiology consultation a 30 day monitor was recommended as an outpatient. Monitor results showed no pauses or malignant arrhythmia to explain syncope.  Occasionally he continues to have his flare-ups of what he refers to as "nervous stomach." However, he is working on dietary changes. He usually drinks at least 80 oz of water. He continues to avoid adding any salt to his diet.  PCP made changes to his bisoprolol hctz and he is now on 1/2 tab, as well as 1/2 tab of enalapril, 10 mg daily. Attempt made to stop bisoprolol-hctz but patient describes symptoms while off of BB which are not tolerable. He prefers to be on BB, helps with symptoms associated with anxiety. I have informed him we may need to move to a non-combination pill. He will consider. BP overall stable, mildly above goal today. He would prefer to make no changes today.  He denies any palpitations, chest pain, shortness of breath, or peripheral edema. No lightheadedness, headaches, syncope, orthopnea, or PND.   Past Medical History:  Diagnosis Date   Benign localized prostatic hyperplasia with lower urinary tract symptoms (LUTS)    Bruises easily    Elevated PSA    GERD (gastroesophageal reflux disease)     History of gout    History of kidney stones    Hyperlipidemia    Hypertension    Nephrolithiasis    Nocturia    Pre-diabetes    Vitamin D deficiency    Wears dentures    full upper and lower partial     Past Surgical History:  Procedure Laterality Date   COLONOSCOPY  2008   CYSTOSCOPY WITH RETROGRADE PYELOGRAM, URETEROSCOPY AND STENT PLACEMENT Bilateral 04/19/2016   Procedure: CYSTOSCOPY WITH BILATERAL RETROGRADE URETEROSCOPY BASKET EXTRACTION;  Surgeon: Irine Seal, MD;  Location: [facility];  Service: Urology;  Laterality: Bilateral;   EXTRACORPOREAL SHOCK WAVE LITHOTRIPSY  [phone]   HAND LIGAMENT RECONSTRUCTION Right 2009   PROSTATE BIOPSY  2000, 12/2004   negative    Current Medications: Current Meds  Medication Sig   acetaminophen (TYLENOL) 500 MG tablet Take 1,000 mg by mouth every 6 (six) hours as needed for mild pain.   Alfalfa 500 MG TABS Take 1,000 mg by mouth daily.   allopurinol (ZYLOPRIM) 300 MG tablet TAKE 1 TABLET BY MOUTH DAILY FOR GOUT   aspirin 81 MG tablet Take 81 mg by mouth daily.   atorvastatin (LIPITOR) 10 MG tablet TAKE 1 TABLET BY MOUTH DAILY FOR CHOLESTEROL (Patient taking differently: Take 10 mg by mouth daily.)   bisoprolol-hydrochlorothiazide (ZIAC) 2.5-6.25 MG tablet Take 0.5 tablets by mouth daily.   blood glucose meter kit and supplies Dispense based insurance preference. E11.9   cetirizine (ZYRTEC) 10 MG tablet Take 10 mg  by mouth daily as needed for allergies.   Cholecalciferol (VITAMIN D3) 2000 units TABS Take 6,000 Units by mouth daily.   Cinnamon 500 MG TABS Take 1,000 mg by mouth daily.   doxazosin (CARDURA) 8 MG tablet TAKE 1/2 TABLET BY MOUTH AT BEDTIME FOR BLOOD PRESSURE AND PROSTATE   enalapril (VASOTEC) 20 MG tablet Take 10 mg by mouth daily.   finasteride (PROSCAR) 5 MG tablet Take 5 mg by mouth every evening.    Garlic 123XX123 MG CAPS Take 1,000 mg by mouth daily.   glucose blood (ONE TOUCH ULTRA TEST) test  strip USE TO CHECK BLOOD SUGAR ONCE DAILY   glucose blood (ONETOUCH ULTRA) test strip Check   Blood Sugar   Daily  (Dx: e11.9)   glucose blood test strip Check blood sugar once daily   latanoprost (XALATAN) 0.005 % ophthalmic solution Place 1 drop into both eyes daily.   Magnesium 250 MG TABS Take 250 mg by mouth 2 (two) times daily. Breakfast and lunch   minoxidil (LONITEN) 2.5 MG tablet TAKE 1 - 2 TABLETS BY MOUTH DAILY IN THE MORNING FOR BLOOD PRESSURE   Multiple Vitamin (MULTIVITAMIN) tablet Take 1 tablet by mouth daily.   Omega-3 Fatty Acids (OMEGA-3 FISH OIL) 1200 MG CAPS Take 1,200 mg by mouth daily with lunch.   omeprazole (PRILOSEC) 20 MG capsule TAKE 1 CAPSULE BY MOUTH DAILY   OneTouch Delica Lancets 99991111 MISC Check blood sugar 1 time a day.   zinc gluconate 50 MG tablet Take 50 mg by mouth daily.     Allergies:   Adhesive [tape]   Social History   Tobacco Use   Smoking status: Former    Years: 5.00    Types: Cigarettes    Quit date: 05/23/1965    Years since quitting: 57.1   Smokeless tobacco: Former    Types: Chew    Quit date: 04/11/1966  Vaping Use   Vaping Use: Never used  Substance Use Topics   Alcohol use: No   Drug use: No     Family History: The patient's family history includes Breast cancer in his sister; Cancer in his sister and sister; Colon cancer in his sister; Diabetes in his brother, brother, brother, father, mother, and sister; Heart attack in his father; Heart disease in his brother, father, and mother; Pancreatic cancer in his brother; Stroke in his brother. There is no history of Stomach cancer.  ROS:   Please see the history of present illness.    (+) GI upset All other systems reviewed and are negative.  EKGs/Labs/Other Studies Reviewed:    The following studies were reviewed today:  Monitor 08/2021: Impression: no pauses or malignant arrhythmia to explain syncope.   Patch Wear Time:  14 days and 0 hours (2023-03-27T18:16:45-0400 to  2023-04-10T18:16:45-0400)   Patient had a min HR of 37 bpm, max HR of 152 bpm, and avg HR of 56 bpm. Predominant underlying rhythm was Sinus Rhythm. 15 Supraventricular Tachycardia runs occurred, the run with the fastest interval lasting 5 beats with a max rate of 152 bpm, the  longest lasting 14 beats with an avg rate of 104 bpm. Isolated SVEs were rare (<1.0%), SVE Couplets were rare (<1.0%), and SVE Triplets were rare (<1.0%). Isolated VEs were rare (<1.0%), VE Couplets were rare (<1.0%), and no VE Triplets were present.   Echo 08/12/2021:  1. Left ventricular ejection fraction, by estimation, is 60 to 65%. The  left ventricle has normal function. The left ventricle has no  regional  wall motion abnormalities. Left ventricular diastolic parameters are  indeterminate.   2. Right ventricular systolic function is normal. The right ventricular  size is normal. There is normal pulmonary artery systolic pressure.   3. Left atrial size was mildly dilated.   4. The mitral valve is normal in structure. No evidence of mitral valve  regurgitation. No evidence of mitral stenosis.   5. The aortic valve is tricuspid. Aortic valve regurgitation is not  visualized. No aortic stenosis is present.   6. The inferior vena cava is normal in size with greater than 50%  respiratory variability, suggesting right atrial pressure of 3 mmHg.   Bilateral Carotid Duplex 08/12/2021: Summary:  Right Carotid: Velocities in the right ICA are consistent with a 1-39%  stenosis.   Left Carotid: Velocities in the left ICA are consistent with a 1-39%  stenosis.   Vertebrals: Bilateral vertebral arteries demonstrate antegrade flow.    EKG:  EKG is personally reviewed. 06/20/22: NSR 12/17/2021:  EKG was not ordered. 09/13/2021: Sinus bradycardia. Rate 53 bpm.  Imaging studies that I have independently reviewed today: n/a  Recent Labs: 04/13/2022: ALT 27; BUN 19; Creat 0.82; Hemoglobin 14.8; Magnesium 2.0; Platelets 218;  Potassium 4.2; Sodium 139; TSH 1.23   Recent Lipid Panel    Component Value Date/Time   CHOL 167 04/13/2022 0000   TRIG 81 04/13/2022 0000   HDL 57 04/13/2022 0000   CHOLHDL 2.9 04/13/2022 0000   VLDL 11 12/22/2016 1020   LDLCALC 93 04/13/2022 0000    Physical Exam:    VS:  BP (!) 142/76   Pulse 61   Ht 6' 3.5" (1.918 m)   Wt 208 lb 3.2 oz (94.4 kg)   SpO2 96%   BMI 25.68 kg/m     Wt Readings from Last 5 Encounters:  06/20/22 208 lb 3.2 oz (94.4 kg)  04/13/22 203 lb (92.1 kg)  12/21/21 202 lb (91.6 kg)  12/17/21 201 lb 9.6 oz (91.4 kg)  09/13/21 201 lb 3.2 oz (91.3 kg)    Constitutional: No acute distress Eyes: sclera non-icteric, normal conjunctiva and lids ENMT: normal dentition, moist mucous membranes Cardiovascular: regular rhythm, normal rate, no murmur. S1 and S2 normal. No jugular venous distention.  Respiratory: clear to auscultation bilaterally GI : normal bowel sounds, soft and nontender. No distention.   MSK: extremities warm, well perfused. No edema.  NEURO: grossly nonfocal exam, moves all extremities. PSYCH: alert and oriented x 3, normal mood and affect.   ASSESSMENT:    1. Essential hypertension     PLAN:    Essential hypertension - Plan: EKG 12-Lead  Monitor result benign. Syncope unlikely to be arrhythmogenic. May have been related to hypotension, his BP meds are being followed by PCP. BP log reviewed and stable. - somewhat bradycardic, bisoprolol now 2.5 mg in combo pill. Does not feel well off of BB. No changes today based on shared decision making. - continue current antihypertensive therapy, he has close follow up with PCP. Stable BP log on review. - continue ASA 81 mg daily -contine atorvastatin 10 mg daily  Plan:    Keep monitoring blood pressures.  Follow-up:  6 months.  Total time of encounter: 20 minutes total time of encounter, including 15 minutes spent in face-to-face patient care on the date of this encounter. This time  includes coordination of care and counseling regarding above mentioned problem list. Remainder of non-face-to-face time involved reviewing chart documents/testing relevant to the patient encounter and documentation in  the medical record. I have independently reviewed documentation from referring provider.   Cherlynn Kaiser, MD, [place]   Shared Decision Making/Informed Consent:       Medication Adjustments/Labs and Tests Ordered: Current medicines are reviewed at length with the patient today.  Concerns regarding medicines are outlined above.   Orders Placed This Encounter  Procedures   EKG 12-Lead   No orders of the defined types were placed in this encounter.  Patient Instructions  Medication Instructions:  No Changes In Medications at this time.  *If you need a refill on your cardiac medications before your next appointment, please call your pharmacy*  Follow-Up: At [facility], you and your health needs are our priority.  As part of our continuing mission to provide you with exceptional heart care, we have created designated Provider Care Teams.  These Care Teams include your primary Cardiologist (physician) and Advanced Practice Providers (APPs -  Physician Assistants and Nurse Practitioners) who all work together to provide you with the care you need, when you need it.  Your next appointment:   6 month(s)  Provider:   Elouise Munroe, MD

## 2022-06-20 NOTE — Patient Instructions (Signed)
Medication Instructions:  No Changes In Medications at this time.  *If you need a refill on your cardiac medications before your next appointment, please call your pharmacy*  Follow-Up: At [facility], you and your health needs are our priority.  As part of our continuing mission to provide you with exceptional heart care, we have created designated Provider Care Teams.  These Care Teams include your primary Cardiologist (physician) and Advanced Practice Providers (APPs -  Physician Assistants and Nurse Practitioners) who all work together to provide you with the care you need, when you need it.  Your next appointment:   6 month(s)  Provider:   Gayatri A Acharya, MD    

## 2022-06-27 ENCOUNTER — Other Ambulatory Visit: Payer: Self-pay | Admitting: Nurse Practitioner

## 2022-06-28 DIAGNOSIS — C44329 Squamous cell carcinoma of skin of other parts of face: Secondary | ICD-10-CM | POA: Diagnosis not present

## 2022-07-11 DIAGNOSIS — Z5189 Encounter for other specified aftercare: Secondary | ICD-10-CM | POA: Diagnosis not present

## 2022-07-11 DIAGNOSIS — Z85828 Personal history of other malignant neoplasm of skin: Secondary | ICD-10-CM | POA: Diagnosis not present

## 2022-07-25 ENCOUNTER — Encounter: Payer: Self-pay | Admitting: Nurse Practitioner

## 2022-07-25 ENCOUNTER — Ambulatory Visit (INDEPENDENT_AMBULATORY_CARE_PROVIDER_SITE_OTHER): Payer: PPO | Admitting: Nurse Practitioner

## 2022-07-25 VITALS — BP 152/82 | HR 65 | Temp 97.9°F | Resp 17 | Ht 75.5 in | Wt 205.2 lb

## 2022-07-25 DIAGNOSIS — R7309 Other abnormal glucose: Secondary | ICD-10-CM | POA: Diagnosis not present

## 2022-07-25 DIAGNOSIS — E559 Vitamin D deficiency, unspecified: Secondary | ICD-10-CM | POA: Diagnosis not present

## 2022-07-25 DIAGNOSIS — K219 Gastro-esophageal reflux disease without esophagitis: Secondary | ICD-10-CM | POA: Diagnosis not present

## 2022-07-25 DIAGNOSIS — Z0001 Encounter for general adult medical examination with abnormal findings: Secondary | ICD-10-CM | POA: Diagnosis not present

## 2022-07-25 DIAGNOSIS — J301 Allergic rhinitis due to pollen: Secondary | ICD-10-CM | POA: Diagnosis not present

## 2022-07-25 DIAGNOSIS — Z6825 Body mass index (BMI) 25.0-25.9, adult: Secondary | ICD-10-CM

## 2022-07-25 DIAGNOSIS — M109 Gout, unspecified: Secondary | ICD-10-CM

## 2022-07-25 DIAGNOSIS — Z79899 Other long term (current) drug therapy: Secondary | ICD-10-CM

## 2022-07-25 DIAGNOSIS — R001 Bradycardia, unspecified: Secondary | ICD-10-CM | POA: Diagnosis not present

## 2022-07-25 DIAGNOSIS — H9071 Mixed conductive and sensorineural hearing loss, unilateral, right ear, with unrestricted hearing on the contralateral side: Secondary | ICD-10-CM | POA: Diagnosis not present

## 2022-07-25 DIAGNOSIS — R6889 Other general symptoms and signs: Secondary | ICD-10-CM

## 2022-07-25 DIAGNOSIS — Z0000 Encounter for general adult medical examination without abnormal findings: Secondary | ICD-10-CM

## 2022-07-25 DIAGNOSIS — I10 Essential (primary) hypertension: Secondary | ICD-10-CM

## 2022-07-25 DIAGNOSIS — E782 Mixed hyperlipidemia: Secondary | ICD-10-CM

## 2022-07-25 DIAGNOSIS — Z85828 Personal history of other malignant neoplasm of skin: Secondary | ICD-10-CM | POA: Diagnosis not present

## 2022-07-25 DIAGNOSIS — N401 Enlarged prostate with lower urinary tract symptoms: Secondary | ICD-10-CM | POA: Diagnosis not present

## 2022-07-25 NOTE — Patient Instructions (Signed)

## 2022-07-25 NOTE — Progress Notes (Signed)
MEDICARE ANNUAL WELLNESS VISIT AND FOLLOW UP   Assessment:  : Encounter for Medicare annual wellness exam Due Annually  Health maintenance reviewed   Essential hypertension Above goal today. Review of in home BP readings are >130/80. Continue medications as directed. Discussed DASH (Dietary Approaches to Stop Hypertension) DASH diet is lower in sodium than a typical American diet. Cut back on foods that are high in saturated fat, cholesterol, and trans fats. Eat more whole-grain foods, fish, poultry, and nuts Remain active and exercise as tolerated daily.  Monitor BP at home-Call if greater than 130/80.  Check CMP/CBC   Hyperlipidemia, mixed Discussed lifestyle modifications. Recommended diet heavy in fruits and veggies, omega 3's. Decrease consumption of animal meats, cheeses, and dairy products. Remain active and exercise as tolerated. Continue to monitor. Check and monitor lipids   Bradycardia Cardiology following Controlled Continue to monitor HR, if consistently running in the 40's or developing dizziness contact office. Change position slowly Stay well hydrated.  Benign prostatic hyperplasia with lower urinary tract symptoms, symptom details unspecified [facility] following  Doing well at this time Continue medications: doxasosin '8mg'$  1/2 tab nighty, finasteride '5mg'$  evening Will continue to monitor PSA levels checked 03/2022 and WNL  Gout, unspecified cause, unspecified chronicity, unspecified site Continue allopurinol '300mg'$  daily No recent flares Discussed dietary modifications Continue to monitor  Gastroesophageal reflux disease without esophagitis Continue medication No suspected reflux complications (Barret/stricture). Lifestyle modification:  wt loss, avoid meals 2-3h before bedtime. Consider eliminating food triggers:  chocolate, caffeine, EtOH, acid/spicy food.  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 All questions and concerns regarding medications addressed.    Seasonal allergic rhinitis due to pollen Continue antihistamine Avoid triggers  BMI 24.0-24.9,adult Discussed appropriate BMI Diet modification. Physical activity. Encouraged/praised to build confidence.   History of non-melanoma skin cancer Dermatology specialist following Continue to monitor  Orders Placed This Encounter  Procedures   CBC with Differential/Platelet   COMPLETE METABOLIC PANEL WITH GFR   Lipid panel   Hemoglobin A1c   VITAMIN D 25 Hydroxy (Vit-D Deficiency, Fractures)    Notify office for further evaluation and treatment, questions or concerns if any reported s/s fail to improve.   The patient was advised to call back or seek an in-person evaluation if any symptoms worsen or if the condition fails to improve as anticipated.   Further disposition pending results of labs. Discussed med's effects and SE's.    I discussed the assessment and treatment plan with the patient. The patient was provided an opportunity to ask questions and all were answered. The patient agreed with the plan and demonstrated an understanding of the instructions.  Discussed med's effects and SE's. Screening labs and tests as requested with regular follow-up as recommended.  I provided 35 minutes of face-to-face time during this encounter including counseling, chart  review, and critical decision making was preformed.  The Plan of Care is based on a patient-centered health care approach known as shared decision making - the decisions, tests and treatments allow for  patient preferences and values to be balanced with clinical evidence.    Future Appointments  Date Time Provider [place]  10/31/2022  9:30 AM Unk Pinto, MD [facility] None  12/15/2022  9:20 AM Elouise Munroe, MD [facility] None  04/18/2023 10:00 AM Unk Pinto, MD [facility] None  07/19/2023 10:00 AM Darrol Jump, NP [facility] None     Plan:   During the course of the visit the patient was educated and counseled about appropriate screening and preventive services including:   Pneumococcal vaccine  Influenza vaccine Prevnar 13 Td vaccine Screening electrocardiogram Colorectal cancer screening Diabetes screening Glaucoma screening Nutrition counseling    Subjective:  Michael Waller is a 82 y.o. presents for medicare annual welllnesss and 3 month follow up on hypertension, HLD, pre-diabetes, weight and vitamin D deficiency,GERD and gout controlled by meds.    Overall he reports feeling well today.  He spends lots of time in his garden and doing yard work.    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  Located on on right medial malleolar side. He follows with Dr Marella Chimes. Last seen 07/11/2022 for a wound check.  Healing well.  He is on Latanoprost drops for glaucoma from Dr. Sabra Heck , was last seen 01/29/21  He does have seasonal allergic rhinitis which Claritin does help but notices fatigue the next day if he takes the medication so has not been taking regularly only PRN and seems to help.     His blood pressure has been controlled at home running 120's/60's, however elevated in clinic today.  Has BP log and average BP recording is <130/90.  Pulse has been mostly in the 50's, some  low 60 values. He continues to follow with Cardiology. Denies dizziness, SOB.  BP Readings from Last 3 Encounters:  06/20/22 (!) 142/76  04/13/22 138/70  12/21/21 (!) 150/78    BMI is Body mass index is 25.31 kg/m., he is working on diet and exercise.  Wt Readings from Last 3 Encounters:  07/25/22 205 lb 3.2 oz (93.1 kg)  06/20/22 208 lb 3.2 oz (94.4 kg)  04/13/22 203 lb (92.1 kg)   He does not workout but works in the yard [employer]. He denies chest pain, shortness of breath, dizziness.  He is on cholesterol medication, lipitor 10 mg  and denies myalgias. His cholesterol is at goal. The cholesterol last visit was:   Lab Results  Component Value Date   CHOL 167 04/13/2022   HDL 57 04/13/2022   LDLCALC 93 04/13/2022   TRIG 81 04/13/2022   CHOLHDL 2.9 04/13/2022   He has not been working on diet and exercise for prediabetes, and denies hyperglycemia, hypoglycemia , nausea, polydipsia, polyuria and vomiting.  Last A1C in the office was:  Lab Results  Component Value Date   HGBA1C 5.7 (H) 04/13/2022     Last GFR Lab Results  Component Value Date   GFRNONAA >60 08/12/2021    Patient is on Vitamin D supplement.   Lab Results  Component Value Date   VD25OH 39 04/13/2022     He follows with [facility].  Seen 03/30/22 for follow up regarding further evaluation of BPH and lower urinary tract symptoms.  He remains on finasterride and doxazoin for his longstanding BPH with BOO.  PSA was stable at 1.52 4/23. He continues to have nocturia 1-2x.  No hesitancy and good stream, only slightly slower than the past.  He  has a hx of stones with prior right URS.  He has stable LLP stones on KUB.  No hematuria.  Inermittnt perineal pain, but has ha lessened.  No change to POC.  He will f/u in 1 year.   Medication Review:   Current Outpatient Medications (Cardiovascular):    atorvastatin (LIPITOR) 10 MG tablet, TAKE 1 TABLET BY MOUTH DAILY FOR CHOLESTEROL    bisoprolol-hydrochlorothiazide (ZIAC) 2.5-6.25 MG tablet, Take 0.5 tablets by mouth daily.   doxazosin (CARDURA) 8 MG tablet, TAKE 1/2 TABLET BY MOUTH AT BEDTIME FOR BLOOD PRESSURE AND PROSTATE   enalapril (VASOTEC) 20 MG tablet, Take 10 mg by mouth daily.   minoxidil (LONITEN) 2.5 MG tablet, TAKE 1 - 2 TABLETS BY MOUTH DAILY IN THE MORNING FOR BLOOD PRESSURE  Current Outpatient Medications (Respiratory):    cetirizine (ZYRTEC) 10 MG tablet, Take 10 mg by mouth daily as needed for allergies.  Current Outpatient Medications (Analgesics):    acetaminophen (TYLENOL) 500 MG tablet, Take 1,000 mg by mouth every 6 (six) hours as needed for mild pain.   allopurinol (ZYLOPRIM) 300 MG tablet, TAKE 1 TABLET BY MOUTH DAILY FOR GOUT   aspirin 81 MG tablet, Take 81 mg by mouth daily.   Current Outpatient Medications (Other):    Alfalfa 500 MG TABS, Take 1,000 mg by mouth daily.   blood glucose meter kit and supplies, Dispense based insurance preference. E11.9   Cholecalciferol (VITAMIN D3) 2000 units TABS, Take 6,000 Units by mouth daily.   Cinnamon 500 MG TABS, Take 1,000 mg by mouth daily.   finasteride (PROSCAR) 5 MG tablet, Take 5 mg by mouth every evening.    Garlic 123XX123 MG CAPS, Take 1,000 mg by mouth daily.   glucose blood (ONE TOUCH ULTRA TEST) test strip, USE TO CHECK BLOOD SUGAR ONCE DAILY   glucose blood (ONETOUCH ULTRA) test strip, Check   Blood Sugar   Daily  (Dx: e11.9)   glucose blood test strip, Check blood sugar once daily   latanoprost (XALATAN) 0.005 % ophthalmic solution, Place 1 drop into both eyes daily.   Magnesium 250 MG TABS, Take 250 mg by mouth 2 (two) times daily. Breakfast and lunch   Multiple Vitamin (MULTIVITAMIN) tablet, Take 1 tablet by mouth daily.   Omega-3 Fatty Acids (OMEGA-3 FISH OIL) 1200 MG CAPS, Take 1,200 mg by mouth daily with lunch.   omeprazole (PRILOSEC) 20 MG capsule, TAKE 1 CAPSULE BY MOUTH DAILY   OneTouch Delica Lancets [id number] MISC, Check blood sugar 1  time a day.   zinc gluconate 50 MG tablet, Take 50 mg by mouth daily.  Allergies: Allergies  Allergen Reactions   Adhesive [Tape] Other (See Comments)    reddness    Current Problems (verified) has Essential hypertension; Mixed hyperlipidemia; Prediabetes; Vitamin D deficiency; Benign prostatic hyperplasia; Idiopathic gout; Gastroesophageal reflux disease without esophagitis; Seasonal allergic rhinitis due to pollen; Syncope and collapse; History of malignant neoplasm of skin; Rosacea; and Urolithiasis on their problem list.  Screening Tests Immunization History  Administered Date(s) Administered   DTaP 08/31/2005   PFIZER(Purple Top)SARS-COV-2 Vaccination 06/14/2019, 07/04/2019   PPD Test 03/17/2014   Td 07/26/2017    Preventative care: Last colonoscopy: 2019 - no recall d/t age  Prior vaccinations: TD or Tdap: 2019 Influenza: Declined Pneumococcal: Declined Prevnar13: Declined Shingles/Zostavax: Declined  Eye exam: Dr. Sabra Heck 2023 Q6 mo Dentist: Dr. Lear Ng 2023 Q6 mo  Names of Other Physician/Practitioners you currently use: 1. [facility] here for primary  care   Patient Care Team: Unk Pinto, MD as PCP - General (Internal Medicine) Elouise Munroe, MD as PCP - Cardiology (Cardiology) [place], Jennefer Bravo, MD as Referring Physician (Dermatology) Gatha Mayer, MD as Consulting Physician (Gastroenterology) Irine Seal, MD as Attending Physician (Urology) Marica Otter, [place] (Optometry)  Surgical: He  has a past surgical history that includes Prostate biopsy (2000, 12/2004); Extracorporeal shock wave lithotripsy [phone]); Hand ligament reconstruction (Right, 2009); Cystoscopy with retrograde pyelogram, ureteroscopy and stent placement (Bilateral, 04/19/2016); and Colonoscopy (2008). Family His family history includes Breast cancer in his sister; Cancer in his sister and sister; Colon cancer in his sister;  Diabetes in his brother, brother, brother, father, mother, and sister; Heart attack in his father; Heart disease in his brother, father, and mother; Pancreatic cancer in his brother; Stroke in his brother. Social history  He reports that he quit smoking about 57 years ago. His smoking use included cigarettes. He quit smokeless tobacco use about 56 years ago.  His smokeless tobacco use included chew. He reports that he does not drink alcohol and does not use drugs.  MEDICARE WELLNESS OBJECTIVES: Physical activity: Current Exercise Habits: Home exercise routine, Exercise limited by: cardiac condition(s);orthopedic condition(s) Cardiac risk factors:   Depression/mood screen:      07/25/2022   10:14 AM  Depression screen PHQ 2/9  Decreased Interest 0  Down, Depressed, Hopeless 0  PHQ - 2 Score 0    ADLs:     07/25/2022   10:14 AM 04/12/2022   11:35 PM  In your present state of health, do you have any difficulty performing the following activities:  Hearing? 0 0  Vision? 0 0  Difficulty concentrating or making decisions? 0 0  Walking or climbing stairs? 0 0  Dressing or bathing? 0 0  Doing errands, shopping? 0 0  Preparing Food and eating ? N   Using the Toilet? N   In the past six months, have you accidently leaked urine? N   Do you have problems with loss of bowel control? N   Managing your Medications? N   Managing your Finances? N   Housekeeping or managing your Housekeeping? N      Cognitive Testing  Alert? Yes  Normal Appearance?Yes  Oriented to person? Yes  Place? Yes   Time? Yes  Recall of three objects?  Yes  Can perform simple calculations? Yes  Displays appropriate judgment?Yes  Can read the correct time from a watch face?Yes  EOL planning: Does Patient Have a Medical Advance Directive?: No  Review of Systems  Constitutional:  Negative for chills and fever.  HENT:  Negative for congestion, hearing loss, sinus pain, sore throat and tinnitus.   Eyes:  Negative for  blurred vision and double vision.  Respiratory:  Negative for cough, hemoptysis, sputum production, shortness of breath and wheezing.   Cardiovascular:  Negative for chest pain, palpitations and leg swelling.  Gastrointestinal:  Negative for abdominal pain, constipation, diarrhea, heartburn, nausea and vomiting.  Genitourinary:  Negative for dysuria and urgency.  Musculoskeletal:  Negative for back pain, falls, joint pain, myalgias and neck pain.  Skin:  Negative for rash.  Neurological:  Positive for tingling (bottom of feet). Negative for dizziness, tremors, weakness and headaches.  Endo/Heme/Allergies:  Does not bruise/bleed easily.  Psychiatric/Behavioral:  Negative for depression and suicidal ideas. The patient is not nervous/anxious and does not have insomnia.     Objective:   Today's Vitals   07/25/22 0926  Pulse: 65  Resp:  17  Temp: 97.9 F (36.6 C)  SpO2: 99%  Weight: 205 lb 3.2 oz (93.1 kg)  Height: 6' 3.5" (1.918 m)    Body mass index is 25.31 kg/m.  General appearance: alert, no distress, WD/WN, male HEENT: normocephalic, sclerae anicteric, TMs pearly, nares patent, no discharge or erythema, pharynx normal Oral cavity: MMM, no lesions Neck: supple, no lymphadenopathy, no thyromegaly, no masses Heart: RRR, normal S1, S2, no murmurs Lungs: CTA bilaterally, no wheezes, rhonchi, or rales Abdomen: +bs, soft, non tender, non distended, no masses, no hepatomegaly, no splenomegaly Musculoskeletal: nontender, no swelling, no obvious deformity Extremities: no edema, no cyanosis, no clubbing Pulses: 2+ symmetric, upper and lower extremities, normal cap refill Neurological: alert, oriented x 3, CN2-12 intact, strength normal upper extremities and lower extremities, sensation normal throughout, DTRs 2+ throughout, no cerebellar signs, gait normal Psychiatric: normal affect, behavior normal, pleasant   Medicare Attestation I have personally reviewed: The patient's medical and  social history Their use of alcohol, tobacco or illicit drugs Their current medications and supplements The patient's functional ability including ADLs,fall risks, home safety risks, cognitive, and hearing and visual impairment Diet and physical activities Evidence for depression or mood disorders  The patient's weight, height, BMI, and visual acuity have been recorded in the chart.  I have made referrals, counseling, and provided education to the patient based on review of the above and I have provided the patient with a written personalized care plan for preventive services.      Darrol Jump, NP [facility]  07/25/2022

## 2022-07-26 LAB — CBC WITH DIFFERENTIAL/PLATELET
Absolute Monocytes: 566 cells/uL (ref 200–950)
Basophils Absolute: 28 cells/uL (ref 0–200)
Basophils Relative: 0.4 %
Eosinophils Absolute: 90 cells/uL (ref 15–500)
Eosinophils Relative: 1.3 %
HCT: 44.2 % (ref 38.5–50.0)
Hemoglobin: 14.8 g/dL (ref 13.2–17.1)
Lymphs Abs: 1566 cells/uL (ref 850–3900)
MCH: 31.6 pg (ref 27.0–33.0)
MCHC: 33.5 g/dL (ref 32.0–36.0)
MCV: 94.4 fL (ref 80.0–100.0)
MPV: 10.1 fL (ref 7.5–12.5)
Monocytes Relative: 8.2 %
Neutro Abs: 4651 cells/uL (ref 1500–7800)
Neutrophils Relative %: 67.4 %
Platelets: 206 10*3/uL (ref 140–400)
RBC: 4.68 10*6/uL (ref 4.20–5.80)
RDW: 12.5 % (ref 11.0–15.0)
Total Lymphocyte: 22.7 %
WBC: 6.9 10*3/uL (ref 3.8–10.8)

## 2022-07-26 LAB — COMPLETE METABOLIC PANEL WITH GFR
AG Ratio: 1.9 (calc) (ref 1.0–2.5)
ALT: 22 U/L (ref 9–46)
AST: 20 U/L (ref 10–35)
Albumin: 4.3 g/dL (ref 3.6–5.1)
Alkaline phosphatase (APISO): 63 U/L (ref 35–144)
BUN: 22 mg/dL (ref 7–25)
CO2: 31 mmol/L (ref 20–32)
Calcium: 10 mg/dL (ref 8.6–10.3)
Chloride: 103 mmol/L (ref 98–110)
Creat: 0.82 mg/dL (ref 0.70–1.22)
Globulin: 2.3 g/dL (calc) (ref 1.9–3.7)
Glucose, Bld: 108 mg/dL — ABNORMAL HIGH (ref 65–99)
Potassium: 4.5 mmol/L (ref 3.5–5.3)
Sodium: 142 mmol/L (ref 135–146)
Total Bilirubin: 0.6 mg/dL (ref 0.2–1.2)
Total Protein: 6.6 g/dL (ref 6.1–8.1)
eGFR: 88 mL/min/{1.73_m2} (ref >=60)

## 2022-07-26 LAB — VITAMIN D 25 HYDROXY (VIT D DEFICIENCY, FRACTURES): Vit D, 25-Hydroxy: 90 ng/mL (ref 30–100)

## 2022-07-26 LAB — LIPID PANEL
Cholesterol: 171 mg/dL (ref <200)
HDL: 53 mg/dL (ref >=40)
LDL Cholesterol (Calc): 100 mg/dL (calc) — ABNORMAL HIGH
Non-HDL Cholesterol (Calc): 118 mg/dL (calc) (ref <130)
Total CHOL/HDL Ratio: 3.2 (calc) (ref <5.0)
Triglycerides: 89 mg/dL (ref <150)

## 2022-07-26 LAB — HEMOGLOBIN A1C
Hgb A1c MFr Bld: 5.9 % of total Hgb — ABNORMAL HIGH (ref <5.7)
Mean Plasma Glucose: 123 mg/dL
eAG (mmol/L): 6.8 mmol/L

## 2022-08-19 ENCOUNTER — Other Ambulatory Visit: Payer: Self-pay | Admitting: Nurse Practitioner

## 2022-09-06 DIAGNOSIS — L570 Actinic keratosis: Secondary | ICD-10-CM | POA: Diagnosis not present

## 2022-09-06 DIAGNOSIS — L578 Other skin changes due to chronic exposure to nonionizing radiation: Secondary | ICD-10-CM | POA: Diagnosis not present

## 2022-09-06 DIAGNOSIS — L814 Other melanin hyperpigmentation: Secondary | ICD-10-CM | POA: Diagnosis not present

## 2022-09-06 DIAGNOSIS — L821 Other seborrheic keratosis: Secondary | ICD-10-CM | POA: Diagnosis not present

## 2022-09-06 DIAGNOSIS — Z85828 Personal history of other malignant neoplasm of skin: Secondary | ICD-10-CM | POA: Diagnosis not present

## 2022-09-06 DIAGNOSIS — D225 Melanocytic nevi of trunk: Secondary | ICD-10-CM | POA: Diagnosis not present

## 2022-10-12 ENCOUNTER — Ambulatory Visit: Payer: PPO | Admitting: Nurse Practitioner

## 2022-10-30 ENCOUNTER — Encounter: Payer: Self-pay | Admitting: Internal Medicine

## 2022-10-30 DIAGNOSIS — R7309 Other abnormal glucose: Secondary | ICD-10-CM | POA: Insufficient documentation

## 2022-10-30 NOTE — Patient Instructions (Signed)

## 2022-10-30 NOTE — Progress Notes (Unsigned)
Future Appointments  Date Time Provider Department  10/31/2022                      6 mo ov  9:30 AM [name], MD [facility]  12/15/2022  9:20 AM Parke Poisson, MD [facility]  04/18/2023                     cpe 10:00 AM [name], MD [facility]  07/19/2023                      wellness 10:00 AM [name], NP [facility]    History of Present Illness:       This very nice 82 y.o. [name] presents for 3 month follow up with HTN, HLD, Pre-Diabetes and Vitamin D Deficiency.  Patient has Gout controlled w/Allopurinol 7 has GERD likewise controlled with diet & Prilosec.         Patient is treated for HTN  (1986)  & BP has been controlled at home. Today's  . Patient has had no complaints of any cardiac type chest pain, palpitations, dyspnea Pollyann Kennedy /PND, dizziness, claudication or dependent edema.        Hyperlipidemia is near controlled with diet & Atorvastatin. Patient denies myalgias or other med SE's. Last Lipids were near goal :  Lab Results  Component Value Date   CHOL 171 07/25/2022   HDL 53 07/25/2022   LDLCALC 100 (H) 07/25/2022   TRIG 89 07/25/2022   CHOLHDL 3.2 07/25/2022     Also, the patient has history of  PreDiabetes (A1c 5.8% /2006) and has had no symptoms of reactive hypoglycemia, diabetic polys, paresthesias or visual blurring.  Last A1c was near goal :  Lab Results  Component Value Date   HGBA1C 5.9 (H) 07/25/2022                                                        Further, the patient also has history of Vitamin D Deficiency ("49"on tx /2008)  and supplements vitamin D . Last vitamin D was at goal:  Lab Results  Component Value Date   VD25OH 90 07/25/2022     Current Outpatient Medications on File Prior to Visit  Medication Sig   acetaminophen (TYLENOL) 500 MG tablet Take 1,000 mg every 6  hours as needed    Alfalfa 500 MG TABS Take 1,000 mg h daily.   allopurinol (ZYLOPRIM) 300 MG tablet TAKE 1 TABLET  DAILY    aspirin 81  MG tablet Take  daily.   atorvastatin 10 MG tablet TAKE 1 TABLET  DAILY    bisoprolol-hctz  2.5-6.25 MG tablet Take 0.5 tablets  daily.   cetirizine 10 MG tablet Take  daily as needed    VITAMIN D 2000 units  Take 6,000 Units  daily.   Cinnamon 500 MG TABS Take 1,000 mg daily.   doxazosin 8 MG tablet TAKE 1/2 TABLET AT BEDTIME    enalapril 20 MG tablet Take 10 mg  daily.   finasteride  5 MG tablet Take  every evening.    Garlic 1000 MG CAPS Take daily.   XALATAN  ophth soln Place 1 drop into both eyes daily.   Magnesium 250 MG TABS Take  2 times daily   minoxidil (  2.5 MG tablet TAKE 1 - 2 TABLETS DAILY   Multiple Vitamin  Take 1 tablet  daily.   OMEGA-3 FISH OIL  1200 MG CAPS Take 1,200 mg daily    omeprazole (PRILOSEC) 20 MG capsule TAKE 1 CAPSULE DAILY   zinc 50 MG tablet Take daily.     Allergies  Allergen Reactions   Adhesive [Tape] Other (See Comments)    reddness     PMHx:   Past Medical History:  Diagnosis Date   Benign localized prostatic hyperplasia with lower urinary tract symptoms (LUTS)    Bruises easily    Elevated PSA    GERD (gastroesophageal reflux disease)    History of gout    History of kidney stones    Hyperlipidemia    Hypertension    Nephrolithiasis    Nocturia    Pre-diabetes    Vitamin D deficiency    Wears dentures    full upper and lower partial      Immunization History  Administered Date(s) Administered   DTaP 08/31/2005   PFIZER SARS-COV-2 Vacc  06/14/2019, 07/04/2019   PPD Test 03/17/2014   Td 07/26/2017     Past Surgical History:  Procedure Laterality Date   COLONOSCOPY  2008   CYSTOSCOPY WITH RETROGRADE PYELOGRAM, URETEROSCOPY AND STENT PLACEMENT Bilateral 04/19/2016   Procedure: CYSTOSCOPY WITH BILATERAL RETROGRADE URETEROSCOPY BASKET EXTRACTION;  Surgeon: Bjorn Pippin, MD;  Location: [facility];  Service: Urology;  Laterality: Bilateral;   EXTRACORPOREAL SHOCK WAVE LITHOTRIPSY  [phone]   HAND LIGAMENT  RECONSTRUCTION Right 2009   PROSTATE BIOPSY  2000, 12/2004   negative    FHx:    Reviewed / unchanged   SHx:    Reviewed / unchanged    Systems Review:  Constitutional: Denies fever, chills, wt changes, headaches, insomnia, fatigue, night sweats, change in appetite. Eyes: Denies redness, blurred vision, diplopia, discharge, itchy, watery eyes.  ENT: Denies discharge, congestion, post nasal drip, epistaxis, sore throat, earache, hearing loss, dental pain, tinnitus, vertigo, sinus pain, snoring.  CV: Denies chest pain, palpitations, irregular heartbeat, syncope, dyspnea, diaphoresis, orthopnea, PND, claudication or edema. Respiratory: denies cough, dyspnea, DOE, pleurisy, hoarseness, laryngitis, wheezing.  Gastrointestinal: Denies dysphagia, odynophagia, heartburn, reflux, water brash, abdominal pain or cramps, nausea, vomiting, bloating, diarrhea, constipation, hematemesis, melena, hematochezia  or hemorrhoids. Genitourinary: Denies dysuria, frequency, urgency, nocturia, hesitancy, discharge, hematuria or flank pain. Musculoskeletal: Denies arthralgias, myalgias, stiffness, jt. swelling, pain, limping or strain/sprain.  Skin: Denies pruritus, rash, hives, warts, acne, eczema or change in skin lesion(s). Neuro: No weakness, tremor, incoordination, spasms, paresthesia or pain. Psychiatric: Denies confusion, memory loss or sensory loss. Endo: Denies change in weight, skin or hair change.  Heme/Lymph: No excessive bleeding, bruising or enlarged lymph nodes.   Physical Exam  There were no vitals taken for this visit.  Appears  well nourished, well groomed  and in no distress.  Eyes: PERRLA, EOMs, conjunctiva no swelling or erythema. Sinuses: No frontal/maxillary tenderness ENT/Mouth: EAC's clear, TM's nl w/o erythema, bulging. Nares clear w/o erythema, swelling, exudates. Oropharynx clear without erythema or exudates. Oral hygiene is good. Tongue normal, non obstructing. Hearing intact.   Neck: Supple. Thyroid not palpable. Car 2+/2+ without bruits, nodes or JVD. Chest: Respirations nl with BS clear & equal w/o rales, rhonchi, wheezing or stridor.  Cor: Heart sounds normal w/ regular rate and rhythm without sig. murmurs, gallops, clicks or rubs. Peripheral pulses normal and equal  without edema.  Abdomen: Soft & bowel sounds normal. Non-tender w/o  guarding, rebound, hernias, masses or organomegaly.  Lymphatics: Unremarkable.  Musculoskeletal: Full ROM all peripheral extremities, joint stability, 5/5 strength and normal gait.  Skin: Warm, dry without exposed rashes, lesions or ecchymosis apparent.  Neuro: Cranial nerves intact, reflexes equal bilaterally. Sensory-motor testing grossly intact. Tendon reflexes grossly intact.  Pysch: Alert & oriented x 3.  Insight and judgement nl & appropriate. No ideations.   Assessment and Plan:  1. Essential hypertension  - Continue medication, monitor blood pressure at home.  - Continue DASH diet.  Reminder to go to the ER if any CP,  SOB, nausea, dizziness, severe HA, changes vision/speech.    - CBC with Differential/Platelet - COMPLETE METABOLIC PANEL WITH GFR - Magnesium - TSH  2. Hyperlipidemia, mixed  - Continue diet/meds, exercise,& lifestyle modifications.  - Continue monitor periodic cholesterol/liver & renal functions    - Lipid panel - TSH  3. Abnormal glucose  - Continue diet, exercise  - Lifestyle modifications.  - Monitor appropriate labs    - Hemoglobin A1c - Insulin, random  4. Vitamin D deficiency   - Continue supplementation   - VITAMIN D 25 Hydroxy   5. Gout  - Uric acid  6. Medication management  - CBC with Differential/Platelet - COMPLETE METABOLIC PANEL WITH GFR - Magnesium - Lipid panel - TSH - Hemoglobin A1c - Insulin, random - VITAMIN D 25 Hydroxy - Uric acid        Discussed  regular exercise, BP monitoring, weight control to achieve/maintain BMI less than 25 and discussed med  and SE's. Recommended labs to assess /monitor clinical status .  I discussed the assessment and treatment plan with the patient. The patient was provided an opportunity to ask questions and all were answered. The patient agreed with the plan and demonstrated an understanding of the instructions.  I provided over 30 minutes of exam, counseling, chart review and  complex critical decision making.        The patient was advised to call back or seek an in-person evaluation if the symptoms worsen or if the condition fails to improve as anticipated.   Marinus Maw, MD

## 2022-10-31 ENCOUNTER — Encounter: Payer: Self-pay | Admitting: Internal Medicine

## 2022-10-31 ENCOUNTER — Ambulatory Visit (INDEPENDENT_AMBULATORY_CARE_PROVIDER_SITE_OTHER): Payer: PPO | Admitting: Internal Medicine

## 2022-10-31 VITALS — BP 138/70 | HR 67 | Temp 97.9°F | Resp 16 | Ht 75.5 in | Wt 203.0 lb

## 2022-10-31 DIAGNOSIS — E559 Vitamin D deficiency, unspecified: Secondary | ICD-10-CM

## 2022-10-31 DIAGNOSIS — M109 Gout, unspecified: Secondary | ICD-10-CM | POA: Diagnosis not present

## 2022-10-31 DIAGNOSIS — Z79899 Other long term (current) drug therapy: Secondary | ICD-10-CM

## 2022-10-31 DIAGNOSIS — I10 Essential (primary) hypertension: Secondary | ICD-10-CM

## 2022-10-31 DIAGNOSIS — E782 Mixed hyperlipidemia: Secondary | ICD-10-CM | POA: Diagnosis not present

## 2022-10-31 DIAGNOSIS — R7309 Other abnormal glucose: Secondary | ICD-10-CM | POA: Diagnosis not present

## 2022-11-01 LAB — URIC ACID: Uric Acid, Serum: 5.8 mg/dL (ref 4.0–8.0)

## 2022-11-01 LAB — COMPLETE METABOLIC PANEL WITH GFR
AG Ratio: 2 (calc) (ref 1.0–2.5)
ALT: 25 U/L (ref 9–46)
AST: 25 U/L (ref 10–35)
Albumin: 4.3 g/dL (ref 3.6–5.1)
Alkaline phosphatase (APISO): 58 U/L (ref 35–144)
BUN: 14 mg/dL (ref 7–25)
CO2: 30 mmol/L (ref 20–32)
Calcium: 9.9 mg/dL (ref 8.6–10.3)
Chloride: 104 mmol/L (ref 98–110)
Creat: 0.76 mg/dL (ref 0.70–1.22)
Globulin: 2.1 g/dL (calc) (ref 1.9–3.7)
Glucose, Bld: 105 mg/dL — ABNORMAL HIGH (ref 65–99)
Potassium: 4.4 mmol/L (ref 3.5–5.3)
Sodium: 140 mmol/L (ref 135–146)
Total Bilirubin: 0.5 mg/dL (ref 0.2–1.2)
Total Protein: 6.4 g/dL (ref 6.1–8.1)
eGFR: 90 mL/min/{1.73_m2} (ref >=60)

## 2022-11-01 LAB — HEMOGLOBIN A1C
Hgb A1c MFr Bld: 5.7 % of total Hgb — ABNORMAL HIGH (ref <5.7)
Mean Plasma Glucose: 117 mg/dL
eAG (mmol/L): 6.5 mmol/L

## 2022-11-01 LAB — CBC WITH DIFFERENTIAL/PLATELET
Absolute Monocytes: 536 cells/uL (ref 200–950)
Basophils Absolute: 47 cells/uL (ref 0–200)
Basophils Relative: 0.7 %
Eosinophils Absolute: 67 cells/uL (ref 15–500)
Eosinophils Relative: 1 %
HCT: 41.2 % (ref 38.5–50.0)
Hemoglobin: 13.9 g/dL (ref 13.2–17.1)
Lymphs Abs: 1601 cells/uL (ref 850–3900)
MCH: 32 pg (ref 27.0–33.0)
MCHC: 33.7 g/dL (ref 32.0–36.0)
MCV: 94.7 fL (ref 80.0–100.0)
MPV: 10.1 fL (ref 7.5–12.5)
Monocytes Relative: 8 %
Neutro Abs: 4449 cells/uL (ref 1500–7800)
Neutrophils Relative %: 66.4 %
Platelets: 218 10*3/uL (ref 140–400)
RBC: 4.35 10*6/uL (ref 4.20–5.80)
RDW: 12.7 % (ref 11.0–15.0)
Total Lymphocyte: 23.9 %
WBC: 6.7 10*3/uL (ref 3.8–10.8)

## 2022-11-01 LAB — LIPID PANEL
Cholesterol: 153 mg/dL (ref <200)
HDL: 63 mg/dL (ref >=40)
LDL Cholesterol (Calc): 73 mg/dL (calc)
Non-HDL Cholesterol (Calc): 90 mg/dL (calc) (ref <130)
Total CHOL/HDL Ratio: 2.4 (calc) (ref <5.0)
Triglycerides: 85 mg/dL (ref <150)

## 2022-11-01 LAB — INSULIN, RANDOM: Insulin: 7.6 u[IU]/mL

## 2022-11-01 LAB — MAGNESIUM: Magnesium: 2 mg/dL (ref 1.5–2.5)

## 2022-11-01 LAB — VITAMIN D 25 HYDROXY (VIT D DEFICIENCY, FRACTURES): Vit D, 25-Hydroxy: 89 ng/mL (ref 30–100)

## 2022-11-01 LAB — TSH: TSH: 1.29 mIU/L (ref 0.40–4.50)

## 2022-12-02 NOTE — Progress Notes (Signed)
Cardiology Office Note:    Date:  12/15/2022   ID:  Michael Waller, DOB [birth year], MRN [id number]  PCP:  [name], MD  Cardiologist:  Parke Poisson, MD  Electrophysiologist:  None   Referring MD: [name], MD   Chief Complaint/Reason for Referral: Hospital follow-up  History of Present Illness:    Michael Waller is a 82 y.o. male with a history of hypertension, hyperlipidemia, GERD, gout, and nephrolithiasis, here for follow-up.  12/15/22: Stable overall, home blood pressure and heart rate monitor reviewed with normal blood pressure and heart rates in the 50s to 60s.  He continues to be active mowing his own yard and has no exercise intolerance, no presyncope or syncope.  Continues on half a tab of bisoprolol HCTZ combination pill (1.25-3.125 mg total dose daily), and half a tab of enalapril for a total of 10 mg.  His regimen is primarily being monitored and adjusted by his primary care doctor.  Prior visits:  On 08/12/2021 he was admitted to the hospital following a syncopal episode with a 5 minute loss of consciousness. At the time he was sitting in front of the computer. A 2D echo showed no wall motion abnormalities EF of 60%, and telemetry remained sinus rhythm 65-70 bpm. After cardiology consultation a 30 day monitor was recommended as an outpatient. Monitor results showed no pauses or malignant arrhythmia to explain syncope.  Occasionally he continues to have his flare-ups of what he refers to as "nervous stomach." However, he is working on dietary changes. He usually drinks at least 80 oz of water. He continues to avoid adding any salt to his diet.  PCP made changes to his bisoprolol hctz and he is now on 1/2 tab, as well as 1/2 tab of enalapril, 10 mg daily. Attempt made to stop bisoprolol-hctz but patient describes symptoms while off of BB which are not tolerable. He prefers to be on BB, helps with symptoms associated with anxiety. I have informed him we may  need to move to a non-combination pill. He will consider. BP overall stable, mildly above goal today. He would prefer to make no changes today.  He denies any palpitations, chest pain, shortness of breath, or peripheral edema. No lightheadedness, headaches, syncope, orthopnea, or PND.   Past Medical History:  Diagnosis Date   Benign localized prostatic hyperplasia with lower urinary tract symptoms (LUTS)    Bruises easily    Elevated PSA    GERD (gastroesophageal reflux disease)    History of gout    History of kidney stones    Hyperlipidemia    Hypertension    Nephrolithiasis    Nocturia    Pre-diabetes    Vitamin D deficiency    Wears dentures    full upper and lower partial     Past Surgical History:  Procedure Laterality Date   COLONOSCOPY  2008   CYSTOSCOPY WITH RETROGRADE PYELOGRAM, URETEROSCOPY AND STENT PLACEMENT Bilateral 04/19/2016   Procedure: CYSTOSCOPY WITH BILATERAL RETROGRADE URETEROSCOPY BASKET EXTRACTION;  Surgeon: Bjorn Pippin, MD;  Location: [facility];  Service: Urology;  Laterality: Bilateral;   EXTRACORPOREAL SHOCK WAVE LITHOTRIPSY  [phone]   HAND LIGAMENT RECONSTRUCTION Right 2009   PROSTATE BIOPSY  2000, 12/2004   negative    Current Medications: Current Meds  Medication Sig   acetaminophen (TYLENOL) 500 MG tablet Take 1,000 mg by mouth every 6 (six) hours as needed for mild pain.   Alfalfa 500 MG TABS Take 1,000 mg by mouth  daily.   allopurinol (ZYLOPRIM) 300 MG tablet TAKE 1 TABLET BY MOUTH DAILY FOR GOUT   aspirin 81 MG tablet Take 81 mg by mouth daily.   atorvastatin (LIPITOR) 10 MG tablet TAKE 1 TABLET BY MOUTH DAILY FOR CHOLESTEROL   bisoprolol-hydrochlorothiazide (ZIAC) 2.5-6.25 MG tablet Take  1 tablet Daily for BP                                                                                           /                                                                   TAKE                                         BY                                                  MOUTH   blood glucose meter kit and supplies Dispense based insurance preference. E11.9   cetirizine (ZYRTEC) 10 MG tablet Take 10 mg by mouth daily as needed for allergies.   Cholecalciferol (VITAMIN D3) 2000 units TABS Take 6,000 Units by mouth daily.   Cinnamon 500 MG TABS Take 1,000 mg by mouth daily.   doxazosin (CARDURA) 8 MG tablet TAKE 1/2 TABLET BY MOUTH AT BEDTIME FOR BLOOD PRESSURE AND PROSTATE   enalapril (VASOTEC) 20 MG tablet Take 10 mg by mouth daily.   finasteride (PROSCAR) 5 MG tablet Take 5 mg by mouth every evening.    Garlic 1000 MG CAPS Take 1,000 mg by mouth daily.   glucose blood (ONE TOUCH ULTRA TEST) test strip USE TO CHECK BLOOD SUGAR ONCE DAILY   glucose blood (ONETOUCH ULTRA) test strip Check   Blood Sugar   Daily  (Dx: e11.9)   glucose blood test strip Check blood sugar once daily   latanoprost (XALATAN) 0.005 % ophthalmic solution Place 1 drop into both eyes daily.   Magnesium 250 MG TABS Take 250 mg by mouth 2 (two) times daily. Breakfast and lunch   minoxidil (LONITEN) 2.5 MG tablet TAKE 1 - 2 TABLETS BY MOUTH DAILY IN THE MORNING FOR BLOOD PRESSURE   Multiple Vitamin (MULTIVITAMIN) tablet Take 1 tablet by mouth daily.   Omega-3 Fatty Acids (OMEGA-3 FISH OIL) 1200 MG CAPS Take 1,200 mg by mouth daily with lunch.   omeprazole (PRILOSEC) 20 MG capsule TAKE 1 CAPSULE BY MOUTH DAILY   OneTouch Delica Lancets 33G MISC Check blood sugar 1 time a day.   zinc gluconate 50 MG tablet Take 50 mg by mouth daily.     Allergies:  Adhesive [tape]   Social History   Tobacco Use   Smoking status: Former    Current packs/day: 0.00    Types: Cigarettes    Start date: 05/23/1960    Quit date: 05/23/1965    Years since quitting: 57.6   Smokeless tobacco: Former    Types: Chew    Quit date: 04/11/1966  Vaping Use   Vaping status: Never Used  Substance Use Topics   Alcohol use: No   Drug use: No     Family History: The  patient's family history includes Breast cancer in his sister; Cancer in his sister and sister; Colon cancer in his sister; Diabetes in his brother, brother, brother, father, mother, and sister; Heart attack in his father; Heart disease in his brother, father, and mother; Pancreatic cancer in his brother; Stroke in his brother. There is no history of Stomach cancer.  ROS:   Please see the history of present illness.     All other systems reviewed and are negative.  EKGs/Labs/Other Studies Reviewed:    The following studies were reviewed today:  Monitor 08/2021: Impression: no pauses or malignant arrhythmia to explain syncope.   Patch Wear Time:  14 days and 0 hours (2023-03-27T18:16:45-0400 to 2023-04-10T18:16:45-0400)   Patient had a min HR of 37 bpm, max HR of 152 bpm, and avg HR of 56 bpm. Predominant underlying rhythm was Sinus Rhythm. 15 Supraventricular Tachycardia runs occurred, the run with the fastest interval lasting 5 beats with a max rate of 152 bpm, the  longest lasting 14 beats with an avg rate of 104 bpm. Isolated SVEs were rare (<1.0%), SVE Couplets were rare (<1.0%), and SVE Triplets were rare (<1.0%). Isolated VEs were rare (<1.0%), VE Couplets were rare (<1.0%), and no VE Triplets were present.   Echo 08/12/2021:  1. Left ventricular ejection fraction, by estimation, is 60 to 65%. The  left ventricle has normal function. The left ventricle has no regional  wall motion abnormalities. Left ventricular diastolic parameters are  indeterminate.   2. Right ventricular systolic function is normal. The right ventricular  size is normal. There is normal pulmonary artery systolic pressure.   3. Left atrial size was mildly dilated.   4. The mitral valve is normal in structure. No evidence of mitral valve  regurgitation. No evidence of mitral stenosis.   5. The aortic valve is tricuspid. Aortic valve regurgitation is not  visualized. No aortic stenosis is present.   6. The  inferior vena cava is normal in size with greater than 50%  respiratory variability, suggesting right atrial pressure of 3 mmHg.   Bilateral Carotid Duplex 08/12/2021: Summary:  Right Carotid: Velocities in the right ICA are consistent with a 1-39%  stenosis.   Left Carotid: Velocities in the left ICA are consistent with a 1-39%  stenosis.   Vertebrals: Bilateral vertebral arteries demonstrate antegrade flow.    EKG:  EKG is personally reviewed. EKG Interpretation Date/Time:  Thursday December 15 2022 09:02:32 EDT Ventricular Rate:  55 PR Interval:  192 QRS Duration:  96 QT Interval:  416 QTC Calculation: 397 R Axis:   8  Text Interpretation: Sinus bradycardia When compared with ECG of 12-Aug-2021 08:15, PREVIOUS ECG IS PRESENT Confirmed by Weston Brass ([id number]) on 12/15/2022 9:20:02 AM   06/20/22: NSR 12/17/2021:  EKG was not ordered. 09/13/2021: Sinus bradycardia. Rate 53 bpm.  Imaging studies that I have independently reviewed today: n/a  Recent Labs: 10/31/2022: ALT 25; BUN 14; Creat 0.76; Hemoglobin 13.9; Magnesium 2.0;  Platelets 218; Potassium 4.4; Sodium 140; TSH 1.29   Recent Lipid Panel    Component Value Date/Time   CHOL 153 10/31/2022 1009   TRIG 85 10/31/2022 1009   HDL 63 10/31/2022 1009   CHOLHDL 2.4 10/31/2022 1009   VLDL 11 12/22/2016 1020   LDLCALC 73 10/31/2022 1009    Physical Exam:    VS:  BP 138/62   Pulse (!) 55   Ht 6' 3.5" (1.918 m)   Wt 202 lb 12.8 oz (92 kg)   SpO2 97%   BMI 25.01 kg/m     Wt Readings from Last 5 Encounters:  12/15/22 202 lb 12.8 oz (92 kg)  10/31/22 203 lb (92.1 kg)  07/25/22 205 lb 3.2 oz (93.1 kg)  06/20/22 208 lb 3.2 oz (94.4 kg)  04/13/22 203 lb (92.1 kg)    Constitutional: No acute distress Eyes: sclera non-icteric, normal conjunctiva and lids ENMT: normal dentition, moist mucous membranes Cardiovascular: regular rhythm, normal rate, soft systolic murmur. S1 and S2 normal. No jugular venous distention.   Respiratory: clear to auscultation bilaterally GI : normal bowel sounds, soft and nontender. No distention.   MSK: extremities warm, well perfused. No edema.  NEURO: grossly nonfocal exam, moves all extremities. PSYCH: alert and oriented x 3, normal mood and affect.   ASSESSMENT:    1. Essential hypertension   2. Syncope and collapse   3. Mixed hyperlipidemia      PLAN:    Essential hypertension - Plan: EKG 12-Lead  Syncope and collapse - Plan: EKG 12-Lead  Mixed hyperlipidemia  Monitor result benign. Syncope unlikely to be arrhythmic. May have been related to hypotension, his BP meds are being followed by PCP. BP log reviewed again today and stable.  -Mildly bradycardic, bisoprolol-HCTZ combination pill, of which she takes a half a tab.  He has previously tried to stop this medication entirely, but does not feel well off of BB. No changes today based on shared decision making. - continue current antihypertensive therapy, he has close follow up with PCP. Stable BP log on review. - continue ASA 81 mg daily -contine atorvastatin 10 mg daily, lipids from June 2024 reviewed and are optimized, LDL is 73.  Follow-up:  6 months.  Total time of encounter: 20 minutes total time of encounter, including 13 minutes spent in face-to-face patient care on the date of this encounter. This time includes coordination of care and counseling regarding above mentioned problem list. Remainder of non-face-to-face time involved reviewing chart documents/testing relevant to the patient encounter and documentation in the medical record. I have independently reviewed documentation from referring provider.   Weston Brass, MD, [facility]  [facility]   Shared Decision Making/Informed Consent:       Medication Adjustments/Labs and Tests Ordered: Current medicines are reviewed at length with the patient today.  Concerns regarding medicines are outlined above.   Orders Placed This Encounter   Procedures   EKG 12-Lead   No orders of the defined types were placed in this encounter.  Patient Instructions  Medication Instructions:  Your physician recommends that you continue on your current medications as directed. Please refer to the Current Medication list given to you today.  *If you need a refill on your cardiac medications before your next appointment, please call your pharmacy*    Follow-Up: At [facility], you and your health needs are our priority.  As part of our continuing mission to provide you with exceptional heart care, we have created  designated Provider Care Teams.  These Care Teams include your primary Cardiologist (physician) and Advanced Practice Providers (APPs -  Physician Assistants and Nurse Practitioners) who all work together to provide you with the care you need, when you need it.  We recommend signing up for the patient portal called "MyChart".  Sign up information is provided on this After Visit Summary.  MyChart is used to connect with patients for Virtual Visits (Telemedicine).  Patients are able to view lab/test results, encounter notes, upcoming appointments, etc.  Non-urgent messages can be sent to your provider as well.   To learn more about what you can do with MyChart, go to [email or web address].    Your next appointment:   6 month(s)  Provider:   Parke Poisson, MD

## 2022-12-05 DIAGNOSIS — R351 Nocturia: Secondary | ICD-10-CM | POA: Diagnosis not present

## 2022-12-05 DIAGNOSIS — N401 Enlarged prostate with lower urinary tract symptoms: Secondary | ICD-10-CM | POA: Diagnosis not present

## 2022-12-05 DIAGNOSIS — R102 Pelvic and perineal pain: Secondary | ICD-10-CM | POA: Diagnosis not present

## 2022-12-05 DIAGNOSIS — N200 Calculus of kidney: Secondary | ICD-10-CM | POA: Diagnosis not present

## 2022-12-07 DIAGNOSIS — L723 Sebaceous cyst: Secondary | ICD-10-CM | POA: Diagnosis not present

## 2022-12-07 DIAGNOSIS — L720 Epidermal cyst: Secondary | ICD-10-CM | POA: Diagnosis not present

## 2022-12-07 DIAGNOSIS — L989 Disorder of the skin and subcutaneous tissue, unspecified: Secondary | ICD-10-CM | POA: Diagnosis not present

## 2022-12-07 DIAGNOSIS — L570 Actinic keratosis: Secondary | ICD-10-CM | POA: Diagnosis not present

## 2022-12-13 ENCOUNTER — Other Ambulatory Visit: Payer: Self-pay | Admitting: Nurse Practitioner

## 2022-12-14 ENCOUNTER — Encounter: Payer: Self-pay | Admitting: Internal Medicine

## 2022-12-15 ENCOUNTER — Ambulatory Visit: Payer: PPO | Attending: Internal Medicine | Admitting: Internal Medicine

## 2022-12-15 ENCOUNTER — Encounter: Payer: Self-pay | Admitting: Internal Medicine

## 2022-12-15 VITALS — BP 138/62 | HR 55 | Ht 75.5 in | Wt 202.8 lb

## 2022-12-15 DIAGNOSIS — I10 Essential (primary) hypertension: Secondary | ICD-10-CM | POA: Diagnosis not present

## 2022-12-15 DIAGNOSIS — E782 Mixed hyperlipidemia: Secondary | ICD-10-CM

## 2022-12-15 DIAGNOSIS — R55 Syncope and collapse: Secondary | ICD-10-CM

## 2022-12-15 NOTE — Patient Instructions (Signed)
Medication Instructions:  Your physician recommends that you continue on your current medications as directed. Please refer to the Current Medication list given to you today.  *If you need a refill on your cardiac medications before your next appointment, please call your pharmacy*  Follow-Up: At [facility], you and your health needs are our priority.  As part of our continuing mission to provide you with exceptional heart care, we have created designated Provider Care Teams.  These Care Teams include your primary Cardiologist (physician) and Advanced Practice Providers (APPs -  Physician Assistants and Nurse Practitioners) who all work together to provide you with the care you need, when you need it.  We recommend signing up for the patient portal called "MyChart".  Sign up information is provided on this After Visit Summary.  MyChart is used to connect with patients for Virtual Visits (Telemedicine).  Patients are able to view lab/test results, encounter notes, upcoming appointments, etc.  Non-urgent messages can be sent to your provider as well.   To learn more about what you can do with MyChart, go to [email or web address].    Your next appointment:   6 month(s)  Provider:   Gayatri A Acharya, MD      

## 2023-01-09 DIAGNOSIS — D485 Neoplasm of uncertain behavior of skin: Secondary | ICD-10-CM | POA: Diagnosis not present

## 2023-01-09 DIAGNOSIS — L570 Actinic keratosis: Secondary | ICD-10-CM | POA: Diagnosis not present

## 2023-01-09 DIAGNOSIS — C44329 Squamous cell carcinoma of skin of other parts of face: Secondary | ICD-10-CM | POA: Diagnosis not present

## 2023-02-06 ENCOUNTER — Encounter: Payer: Self-pay | Admitting: Nurse Practitioner

## 2023-02-06 ENCOUNTER — Ambulatory Visit (INDEPENDENT_AMBULATORY_CARE_PROVIDER_SITE_OTHER): Payer: PPO | Admitting: Nurse Practitioner

## 2023-02-06 VITALS — BP 140/62 | HR 58 | Temp 97.7°F | Ht 75.5 in | Wt 202.2 lb

## 2023-02-06 DIAGNOSIS — I10 Essential (primary) hypertension: Secondary | ICD-10-CM | POA: Diagnosis not present

## 2023-02-06 DIAGNOSIS — E782 Mixed hyperlipidemia: Secondary | ICD-10-CM | POA: Diagnosis not present

## 2023-02-06 DIAGNOSIS — H9071 Mixed conductive and sensorineural hearing loss, unilateral, right ear, with unrestricted hearing on the contralateral side: Secondary | ICD-10-CM

## 2023-02-06 DIAGNOSIS — Z85820 Personal history of malignant melanoma of skin: Secondary | ICD-10-CM

## 2023-02-06 DIAGNOSIS — Z79899 Other long term (current) drug therapy: Secondary | ICD-10-CM

## 2023-02-06 DIAGNOSIS — Z6825 Body mass index (BMI) 25.0-25.9, adult: Secondary | ICD-10-CM | POA: Diagnosis not present

## 2023-02-06 DIAGNOSIS — E559 Vitamin D deficiency, unspecified: Secondary | ICD-10-CM | POA: Diagnosis not present

## 2023-02-06 DIAGNOSIS — R001 Bradycardia, unspecified: Secondary | ICD-10-CM | POA: Diagnosis not present

## 2023-02-06 DIAGNOSIS — J301 Allergic rhinitis due to pollen: Secondary | ICD-10-CM

## 2023-02-06 DIAGNOSIS — N401 Enlarged prostate with lower urinary tract symptoms: Secondary | ICD-10-CM | POA: Diagnosis not present

## 2023-02-06 DIAGNOSIS — K219 Gastro-esophageal reflux disease without esophagitis: Secondary | ICD-10-CM

## 2023-02-06 DIAGNOSIS — R7309 Other abnormal glucose: Secondary | ICD-10-CM | POA: Diagnosis not present

## 2023-02-06 DIAGNOSIS — M109 Gout, unspecified: Secondary | ICD-10-CM | POA: Diagnosis not present

## 2023-02-06 NOTE — Patient Instructions (Signed)

## 2023-02-06 NOTE — Addendum Note (Signed)
Addended by: [name] on: 02/06/2023 10:29 AM   Modules accepted: Orders

## 2023-02-06 NOTE — Progress Notes (Signed)
FOLLOW UP  Assessment:   Essential hypertension Continue medications as directed. Discussed DASH (Dietary Approaches to Stop Hypertension) DASH diet is lower in sodium than a typical American diet. Cut back on foods that are high in saturated fat, cholesterol, and trans fats. Eat more whole-grain foods, fish, poultry, and nuts Remain active and exercise as tolerated daily.  Monitor BP at home-Call if greater than 130/80.  Check CMP/CBC  Hyperlipidemia, mixed Discussed lifestyle modifications. Recommended diet heavy in fruits and veggies, omega 3's. Decrease consumption of animal meats, cheeses, and dairy products. Remain active and exercise as tolerated. Continue to monitor. Check and monitor lipids  Bradycardia Cardiology following Controlled Continue to monitor HR, if consistently running in the 40's or developing dizziness contact office. Change position slowly Stay well hydrated.  Benign prostatic hyperplasia with lower urinary tract symptoms, symptom details unspecified [facility] following  Doing well at this time Continue medications: doxasosin 8mg  1/2 tab nighty, finasteride 5mg  evening Will continue to monitor PSA levels checked 03/2022 and WNL  Gout, unspecified cause, unspecified chronicity, unspecified site Continue allopurinol 300mg  daily No recent flares Discussed dietary modifications Continue to monitor  Gastroesophageal reflux disease without esophagitis Continue medication No suspected reflux complications (Barret/stricture). Lifestyle modification:  wt loss, avoid meals 2-3h before bedtime. Consider eliminating food triggers:  chocolate, caffeine, EtOH, acid/spicy food.  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 All questions and concerns regarding medications addressed.    Seasonal allergic rhinitis due to pollen Continue antihistamine Avoid triggers  BMI 24.0-24.9,adult Discussed appropriate BMI Diet modification. Physical activity. Encouraged/praised to build confidence.  Hx of Melanoma Continue to follow with [facility] for surgical removal of left temple skin change   Orders Placed This Encounter  Procedures   CBC with Differential/Platelet   COMPLETE METABOLIC PANEL WITH GFR   Hemoglobin A1c   Lipid panel     Notify office for further evaluation and treatment, questions or concerns if any reported s/s fail to improve.   The patient was advised to call back or seek an in-person evaluation if any symptoms worsen or if the condition fails to improve as anticipated.   Further disposition pending results of labs. Discussed med's effects and SE's.    I discussed the assessment and treatment plan with the patient. The patient was provided an opportunity to ask questions and all were answered. The patient agreed with the plan and demonstrated an understanding of the instructions.  Discussed med's effects and SE's. Screening labs and tests as requested with regular follow-up as recommended.  I provided 30 minutes of face-to-face time during this encounter including counseling, chart review, and critical decision making was preformed.  The Plan of Care is based on a patient-centered health care approach known as shared decision making - the decisions, tests and treatments allow for patient preferences and values to be balanced  with clinical evidence.    Future Appointments  Date Time Provider Department Center  04/18/2023 10:00 AM [name], MD [facility] None  07/19/2023 10:00 AM [name],  NP [facility] None    Subjective:  Michael Waller is a 82 y.o. presents for a 3 month follow up on hypertension, HLD, pre-diabetes, weight and vitamin D deficiency,GERD and gout controlled by meds.    Overall he reports feeling well today.  He spends lots of time in his garden and doing yard work.    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  Located on on right medial malleolar side. He will be following up next week to have another spot removed on his left temple.  He follows with Dr Elpidio Anis. Last seen 07/11/2022 for a wound check.  Healing well.  He is on Latanoprost drops for glaucoma from Dr. Hyacinth Meeker , was last seen 01/29/21  He does have seasonal allergic rhinitis which Claritin does help but notices fatigue the next day if he takes the medication so has not been taking regularly only PRN and seems to help.     His blood pressure has been controlled at home running 120's/60's, however elevated in clinic today.  Has BP log and average BP recording is <130/90.  Pulse has been mostly in the 50's, some low 60 values. He continues to follow with Cardiology. Denies dizziness, SOB.  BP Readings from Last 3 Encounters:  02/06/23 (!) 140/62  12/15/22 138/62  10/31/22 138/70    BMI is Body mass index is 24.94 kg/m., he is working on diet and exercise.  Wt Readings from Last 3 Encounters:  02/06/23 202 lb 3.2 oz (91.7 kg)  12/15/22 202 lb 12.8 oz (92 kg)  10/31/22 203 lb (92.1 kg)   He does not workout but works in the yard [employer]. He denies chest pain, shortness of breath, dizziness.  He is on cholesterol medication, lipitor 10 mg  and denies myalgias. His cholesterol is at goal. The cholesterol last visit was:   Lab Results   Component Value Date   CHOL 153 10/31/2022   HDL 63 10/31/2022   LDLCALC 73 10/31/2022   TRIG 85 10/31/2022   CHOLHDL 2.4 10/31/2022   He has not been working on diet and exercise for prediabetes, and denies hyperglycemia, hypoglycemia , nausea, polydipsia, polyuria and vomiting.  Last A1C in the office was:  Lab Results  Component Value Date   HGBA1C 5.7 (H) 10/31/2022     Last GFR Lab Results  Component Value Date   GFRNONAA >60 08/12/2021    Patient is on Vitamin D supplement.   Lab Results  Component Value Date   VD25OH 89 10/31/2022     He follows with [facility].  Seen 03/30/22 for follow up regarding further evaluation of BPH and lower urinary tract symptoms.  He remains on finasterride and doxazoin for his longstanding BPH with BOO.  PSA was stable at 1.52 4/23. He continues to have nocturia 1-2x.  No hesitancy and good stream, only slightly slower than the past.  He has a hx of stones with prior right URS.  He has stable LLP stones on KUB.  No hematuria.  Inermittnt perineal pain, but has ha lessened.  No change to POC.  He will f/u in 1 year.   Medication Review:   Current Outpatient Medications (Cardiovascular):    atorvastatin (LIPITOR) 10 MG tablet, TAKE 1 TABLET BY MOUTH DAILY FOR CHOLESTEROL   bisoprolol-hydrochlorothiazide (ZIAC) 2.5-6.25 MG tablet, Take  1 tablet Daily for BP                                                                                           /  TAKE                                         BY                                                 MOUTH   doxazosin (CARDURA) 8 MG tablet, TAKE 1/2 TABLET BY MOUTH AT BEDTIME FOR BLOOD PRESSURE AND PROSTATE   enalapril (VASOTEC) 20 MG tablet, Take 10 mg by mouth daily.   minoxidil (LONITEN) 2.5 MG tablet, TAKE 1 - 2 TABLETS BY MOUTH DAILY IN THE MORNING FOR BLOOD PRESSURE  Current Outpatient Medications (Respiratory):    cetirizine  (ZYRTEC) 10 MG tablet, Take 10 mg by mouth daily as needed for allergies.  Current Outpatient Medications (Analgesics):    acetaminophen (TYLENOL) 500 MG tablet, Take 1,000 mg by mouth every 6 (six) hours as needed for mild pain.   allopurinol (ZYLOPRIM) 300 MG tablet, TAKE 1 TABLET BY MOUTH DAILY FOR GOUT   aspirin 81 MG tablet, Take 81 mg by mouth daily.   Current Outpatient Medications (Other):    Alfalfa 500 MG TABS, Take 1,000 mg by mouth daily.   blood glucose meter kit and supplies, Dispense based insurance preference. E11.9   Cholecalciferol (VITAMIN D3) 2000 units TABS, Take 6,000 Units by mouth daily.   Cinnamon 500 MG TABS, Take 1,000 mg by mouth daily.   finasteride (PROSCAR) 5 MG tablet, Take 5 mg by mouth every evening.    Garlic 1000 MG CAPS, Take 1,000 mg by mouth daily.   glucose blood (ONE TOUCH ULTRA TEST) test strip, USE TO CHECK BLOOD SUGAR ONCE DAILY   glucose blood (ONETOUCH ULTRA) test strip, Check   Blood Sugar   Daily  (Dx: e11.9)   glucose blood test strip, Check blood sugar once daily   latanoprost (XALATAN) 0.005 % ophthalmic solution, Place 1 drop into both eyes daily.   Magnesium 250 MG TABS, Take 250 mg by mouth 2 (two) times daily. Breakfast and lunch   Multiple Vitamin (MULTIVITAMIN) tablet, Take 1 tablet by mouth daily.   Omega-3 Fatty Acids (OMEGA-3 FISH OIL) 1200 MG CAPS, Take 1,200 mg by mouth daily with lunch.   omeprazole (PRILOSEC) 20 MG capsule, TAKE 1 CAPSULE BY MOUTH DAILY   OneTouch Delica Lancets 33G MISC, Check blood sugar 1 time a day.   zinc gluconate 50 MG tablet, Take 50 mg by mouth daily.  Allergies: Allergies  Allergen Reactions   Adhesive [Tape] Other (See Comments)    reddness    Current Problems (verified) has Essential hypertension; Hyperlipidemia, mixed; Prediabetes; Vitamin D deficiency; Benign prostatic hyperplasia; Gout; Gastroesophageal reflux disease without esophagitis; Seasonal allergic rhinitis due to pollen; Syncope  and collapse; History of malignant neoplasm of skin; Rosacea; Urolithiasis; and Abnormal glucose on their problem list.  Screening Tests Immunization History  Administered Date(s) Administered   DTaP 08/31/2005   PFIZER(Purple Top)SARS-COV-2 Vaccination 06/14/2019, 07/04/2019   PPD Test 03/17/2014   Td 07/26/2017   Patient Care Team: [name], MD as PCP - General (Internal Medicine) Parke Poisson, MD as PCP - Cardiology (Cardiology) [facility], Elvin So, MD as Referring Physician (Dermatology) Iva Boop, MD as Consulting Physician (Gastroenterology) Bjorn Pippin, MD as Attending Physician (Urology) Hyacinth Meeker,  Kennon Rounds, OD (Optometry)  Surgical: He  has a past surgical history that includes Prostate biopsy (2000, 12/2004); Extracorporeal shock wave lithotripsy [phone]); Hand ligament reconstruction (Right, 2009); Cystoscopy with retrograde pyelogram, ureteroscopy and stent placement (Bilateral, 04/19/2016); and Colonoscopy (2008). Family His family history includes Breast cancer in his sister; Cancer in his sister and sister; Colon cancer in his sister; Diabetes in his brother, brother, brother, father, mother, and sister; Heart attack in his father; Heart disease in his brother, father, and mother; Pancreatic cancer in his brother; Stroke in his brother. Social history  He reports that he quit smoking about 57 years ago. His smoking use included cigarettes. He started smoking about 62 years ago. He quit smokeless tobacco use about 56 years ago.  His smokeless tobacco use included chew. He reports that he does not drink alcohol and does not use drugs.  Review of Systems  Constitutional:  Negative for chills and fever.  HENT:  Negative for congestion, hearing loss, sinus pain, sore throat and tinnitus.   Eyes:  Negative for blurred vision and double vision.  Respiratory:  Negative for cough, hemoptysis, sputum production, shortness of breath and wheezing.    Cardiovascular:  Negative for chest pain, palpitations and leg swelling.  Gastrointestinal:  Negative for abdominal pain, constipation, diarrhea, heartburn, nausea and vomiting.  Genitourinary:  Negative for dysuria and urgency.  Musculoskeletal:  Negative for back pain, falls, joint pain, myalgias and neck pain.  Skin:  Negative for rash.  Neurological:  Positive for tingling (bottom of feet). Negative for dizziness, tremors, weakness and headaches.  Endo/Heme/Allergies:  Does not bruise/bleed easily.  Psychiatric/Behavioral:  Negative for depression and suicidal ideas. The patient is not nervous/anxious and does not have insomnia.     Objective:   Today's Vitals   02/06/23 0919  BP: (!) 140/62  Pulse: (!) 58  Temp: 97.7 F (36.5 C)  SpO2: 96%  Weight: 202 lb 3.2 oz (91.7 kg)  Height: 6' 3.5" (1.918 m)     Body mass index is 24.94 kg/m.  General appearance: alert, no distress, WD/WN, male HEENT: normocephalic, sclerae anicteric, TMs pearly, nares patent, no discharge or erythema, pharynx normal Oral cavity: MMM, no lesions Neck: supple, no lymphadenopathy, no thyromegaly, no masses Heart: RRR, normal S1, S2, no murmurs Lungs: CTA bilaterally, no wheezes, rhonchi, or rales Abdomen: +bs, soft, non tender, non distended, no masses, no hepatomegaly, no splenomegaly Musculoskeletal: nontender, no swelling, no obvious deformity Extremities: no edema, no cyanosis, no clubbing Pulses: 2+ symmetric, upper and lower extremities, normal cap refill Neurological: alert, oriented x 3, CN2-12 intact, strength normal upper extremities and lower extremities, sensation normal throughout, DTRs 2+ throughout, no cerebellar signs, gait normal Psychiatric: normal affect, behavior normal, pleasant   [name], NP [facility]  02/06/2023

## 2023-02-07 LAB — CBC WITH DIFFERENTIAL/PLATELET
Absolute Monocytes: 540 {cells}/uL (ref 200–950)
Basophils Absolute: 28 cells/uL (ref 0–200)
Basophils Relative: 0.4 %
Eosinophils Absolute: 107 {cells}/uL (ref 15–500)
Eosinophils Relative: 1.5 %
HCT: 41.8 % (ref 38.5–50.0)
Hemoglobin: 14.1 g/dL (ref 13.2–17.1)
Lymphs Abs: 1498 {cells}/uL (ref 850–3900)
MCH: 32.3 pg (ref 27.0–33.0)
MCHC: 33.7 g/dL (ref 32.0–36.0)
MCV: 95.9 fL (ref 80.0–100.0)
MPV: 10.4 fL (ref 7.5–12.5)
Monocytes Relative: 7.6 %
Neutro Abs: 4927 {cells}/uL (ref 1500–7800)
Neutrophils Relative %: 69.4 %
Platelets: 201 10*3/uL (ref 140–400)
RBC: 4.36 10*6/uL (ref 4.20–5.80)
RDW: 12.3 % (ref 11.0–15.0)
Total Lymphocyte: 21.1 %
WBC: 7.1 10*3/uL (ref 3.8–10.8)

## 2023-02-07 LAB — COMPLETE METABOLIC PANEL WITH GFR
AG Ratio: 2 (calc) (ref 1.0–2.5)
ALT: 21 U/L (ref 9–46)
AST: 23 U/L (ref 10–35)
Albumin: 4.4 g/dL (ref 3.6–5.1)
Alkaline phosphatase (APISO): 64 U/L (ref 35–144)
BUN: 17 mg/dL (ref 7–25)
CO2: 30 mmol/L (ref 20–32)
Calcium: 9.9 mg/dL (ref 8.6–10.3)
Chloride: 101 mmol/L (ref 98–110)
Creat: 0.8 mg/dL (ref 0.70–1.22)
Globulin: 2.2 g/dL (ref 1.9–3.7)
Glucose, Bld: 98 mg/dL (ref 65–99)
Potassium: 4.3 mmol/L (ref 3.5–5.3)
Sodium: 141 mmol/L (ref 135–146)
Total Bilirubin: 0.6 mg/dL (ref 0.2–1.2)
Total Protein: 6.6 g/dL (ref 6.1–8.1)
eGFR: 88 mL/min/{1.73_m2} (ref >=60)

## 2023-02-07 LAB — TEST AUTHORIZATION: TEST CODE:: 16802

## 2023-02-08 DIAGNOSIS — H401131 Primary open-angle glaucoma, bilateral, mild stage: Secondary | ICD-10-CM | POA: Diagnosis not present

## 2023-02-13 DIAGNOSIS — C44329 Squamous cell carcinoma of skin of other parts of face: Secondary | ICD-10-CM | POA: Diagnosis not present

## 2023-02-15 ENCOUNTER — Other Ambulatory Visit: Payer: Self-pay | Admitting: Nurse Practitioner

## 2023-02-20 DIAGNOSIS — L570 Actinic keratosis: Secondary | ICD-10-CM | POA: Diagnosis not present

## 2023-03-15 ENCOUNTER — Other Ambulatory Visit: Payer: Self-pay

## 2023-03-15 ENCOUNTER — Ambulatory Visit (INDEPENDENT_AMBULATORY_CARE_PROVIDER_SITE_OTHER): Payer: PPO | Admitting: Nurse Practitioner

## 2023-03-15 ENCOUNTER — Encounter: Payer: Self-pay | Admitting: Nurse Practitioner

## 2023-03-15 VITALS — BP 140/72 | HR 73 | Temp 98.3°F | Ht 75.5 in | Wt 200.8 lb

## 2023-03-15 DIAGNOSIS — J0100 Acute maxillary sinusitis, unspecified: Secondary | ICD-10-CM

## 2023-03-15 DIAGNOSIS — Z1152 Encounter for screening for COVID-19: Secondary | ICD-10-CM | POA: Diagnosis not present

## 2023-03-15 LAB — POC COVID19 BINAXNOW: SARS Coronavirus 2 Ag: NEGATIVE

## 2023-03-15 NOTE — Progress Notes (Signed)
Assessment and Plan:  Michael Waller was seen today for an episodic visit.  Diagnoses and all order for this visit:  Encounter for screening for COVID-19 Negative  - POC COVID-19  Acute non-recurrent maxillary sinusitis Stop Sudafed Start Mucinex - sample provided in office Continue Zyrtec Stay well hydrated to keep mucus thin and productive. Neti Pot or warm steamy shower to help loosen mucus. Contact office if s/s fail to improve.  Notify office for further evaluation and treatment, questions or concerns if s/s fail to improve. The risks and benefits of my recommendations, as well as other treatment options were discussed with the patient today. Questions were answered.  Further disposition pending results of labs. Discussed med's effects and SE's.    Over 15 minutes of exam, counseling, chart review, and critical decision making was performed.   Future Appointments  Date Time Provider Department Center  04/18/2023 10:00 AM [name], MD [facility] None  07/19/2023 10:00 AM [name], NP [facility] None    ------------------------------------------------------------------------------------------------------------------   HPI BP (!) 140/72   Pulse 73   Temp 98.3 F (36.8 C)   Ht 6' 3.5" (1.918 m)   Wt 200 lb 12.8 oz (91.1 kg)   SpO2 96%   BMI 24.77 kg/m    Patient complains of possible sinusitis. Symptoms include facial pain, nasal congestion, sinus pressure, and sneezing. Onset of symptoms was 2 days ago, and has been unchanged since that time. Felt to have been associated with attending church service on Monday night.  Treatment to date: antihistamines and decongestants.  Denies fever, chills, N/V.     Past Medical History:  Diagnosis Date   Benign localized prostatic hyperplasia with lower urinary tract symptoms (LUTS)    Bruises easily    Elevated PSA    GERD (gastroesophageal reflux disease)    History of gout    History of kidney stones     Hyperlipidemia    Hypertension    Nephrolithiasis    Nocturia    Pre-diabetes    Vitamin D deficiency    Wears dentures    full upper and lower partial      Allergies  Allergen Reactions   Adhesive [Tape] Other (See Comments)    reddness    Current Outpatient Medications on File Prior to Visit  Medication Sig   acetaminophen (TYLENOL) 500 MG tablet Take 1,000 mg by mouth every 6 (six) hours as needed for mild pain.   Alfalfa 500 MG TABS Take 1,000 mg by mouth daily.   allopurinol (ZYLOPRIM) 300 MG tablet TAKE 1 TABLET BY MOUTH DAILY FOR GOUT   aspirin 81 MG tablet Take 81 mg by mouth daily.   atorvastatin (LIPITOR) 10 MG tablet TAKE 1 TABLET BY MOUTH DAILY FOR CHOLESTEROL   bisoprolol-hydrochlorothiazide (ZIAC) 2.5-6.25 MG tablet Take  1 tablet Daily for BP                                                                                           /  TAKE                                         BY                                                 MOUTH   blood glucose meter kit and supplies Dispense based insurance preference. E11.9   cetirizine (ZYRTEC) 10 MG tablet Take 10 mg by mouth daily as needed for allergies.   Cholecalciferol (VITAMIN D3) 2000 units TABS Take 6,000 Units by mouth daily.   Cinnamon 500 MG TABS Take 1,000 mg by mouth daily.   doxazosin (CARDURA) 8 MG tablet TAKE 1/2 TABLET BY MOUTH AT BEDTIME FOR BLOOD PRESSURE AND PROSTATE   enalapril (VASOTEC) 20 MG tablet Take 10 mg by mouth daily.   finasteride (PROSCAR) 5 MG tablet Take 5 mg by mouth every evening.    Garlic 1000 MG CAPS Take 1,000 mg by mouth daily.   glucose blood (ONE TOUCH ULTRA TEST) test strip USE TO CHECK BLOOD SUGAR ONCE DAILY   glucose blood (ONETOUCH ULTRA) test strip Check   Blood Sugar   Daily  (Dx: e11.9)   glucose blood test strip Check blood sugar once daily   latanoprost (XALATAN) 0.005 % ophthalmic solution Place 1 drop into  both eyes daily.   Magnesium 250 MG TABS Take 250 mg by mouth 2 (two) times daily. Breakfast and lunch   minoxidil (LONITEN) 2.5 MG tablet TAKE 1-2 TABLETS BY MOUTH EVERY MORNING FOR BLOOD PRESSURE   Multiple Vitamin (MULTIVITAMIN) tablet Take 1 tablet by mouth daily.   Omega-3 Fatty Acids (OMEGA-3 FISH OIL) 1200 MG CAPS Take 1,200 mg by mouth daily with lunch.   omeprazole (PRILOSEC) 20 MG capsule TAKE 1 CAPSULE BY MOUTH DAILY   OneTouch Delica Lancets 33G MISC Check blood sugar 1 time a day.   zinc gluconate 50 MG tablet Take 50 mg by mouth daily.   No current facility-administered medications on file prior to visit.    ROS: all negative except what is noted in the HPI.   Physical Exam:  BP (!) 140/72   Pulse 73   Temp 98.3 F (36.8 C)   Ht 6' 3.5" (1.918 m)   Wt 200 lb 12.8 oz (91.1 kg)   SpO2 96%   BMI 24.77 kg/m   General Appearance: NAD.  Awake, conversant and cooperative. Eyes: PERRLA, EOMs intact.  Sclera white.  Conjunctiva without erythema. Sinuses: No frontal/maxillary tenderness.  No nasal discharge. Nares patent.  ENT/Mouth: Ext aud canals clear.  Bilateral TMs w/DOL and without erythema or bulging. Hearing intact.  Posterior pharynx without swelling or exudate.  Tonsils without swelling or erythema.  Neck: Supple.  No masses, nodules or thyromegaly. Respiratory: Effort is regular with non-labored breathing. Breath sounds are equal bilaterally without rales, rhonchi, wheezing or stridor.  Cardio: RRR with no MRGs. Brisk peripheral pulses without edema.  Abdomen: Active BS in all four quadrants.  Soft and non-tender without guarding, rebound tenderness, hernias or masses. Lymphatics: Non tender without lymphadenopathy.  Musculoskeletal: Full ROM, 5/5 strength, normal ambulation.  No clubbing or cyanosis. Skin: Appropriate color for ethnicity. Warm without rashes, lesions, ecchymosis, ulcers.  Neuro: CN II-XII grossly normal. Normal muscle tone without  cerebellar  symptoms and intact sensation.   Psych: AO X 3,  appropriate mood and affect, insight and judgment.     [name], NP 3:39 PM [facility]

## 2023-03-15 NOTE — Patient Instructions (Signed)

## 2023-03-19 ENCOUNTER — Encounter: Payer: Self-pay | Admitting: Internal Medicine

## 2023-03-19 NOTE — Progress Notes (Unsigned)
Annual  Screening/Preventative Visit  & Comprehensive Evaluation & Examination   Future Appointments  Date Time Provider Department  03/20/2023                   cpe 11:00 AM Michael Cowboy, MD [facility]  07/19/2023                    3 mo/wellness 10:00 AM [name], NP [facility]                                             This very nice 82 y.o. [name] with HTN, HLD, Prediabetes and Vitamin D Deficiency who presents for a Screening /Preventative Visit & comprehensive evaluation and management of multiple medical co-morbidities.   Patient's Gout is  controlled on Allopurinol.   Patient also has GERD controlled with diet & Prilosec. Patient has Obs BPH with LUTS controlled on his Doxazosin/Finastertide.       HTN predates circa 1986. Patient's BP has been controlled at home.  Today's BP is at goal -                               .                     Patient denies any cardiac symptoms as chest pain, palpitations, shortness of breath, dizziness or ankle swelling.       Patient's hyperlipidemia is controlled with diet and Atorvastatin. Patient denies myalgias or other medication SE's. Last lipids were at goal :   Lab Results  Component Value Date   CHOL 153 10/31/2022   HDL 63 10/31/2022   LDLCALC 73 10/31/2022   TRIG 85 10/31/2022   CHOLHDL 2.4 10/31/2022        Patient has hx/o prediabetes (A1c 5.8% /2006) and patient denies reactive hypoglycemic symptoms, visual blurring, diabetic polys or paresthesias. Last A1c was normal & at goal :   Lab Results  Component Value Date   HGBA1C 5.7 (H) 10/31/2022        Finally, patient has history of Vitamin D Deficiency ("49"on tx /2008) and last vitamin D was at goal :  Lab Results  Component Value Date   VD25OH 89 10/31/2022       Current Outpatient Medications  Medication Instructions   acetaminophen   1,000 mg Every 6 hours PRN   Alfalfa  1,000 mg Daily   allopurinol  300 MG tablet TAKE 1 TABLET  DAILY FOR  GOUT   aspirin 81 mg, Oral, Daily   atorvastatin 10 MG tablet TAKE 1 TABLET  DAILY    bisoprolol-hctz 2.5-6.25 MG tablet TAKE 1 TABLET  DAILY   cetirizine  10 mg Daily PRN   Cinnamon  1,000 mg Daily   doxazosin 8 MG tablet TAKE 1/2 TABLET AT BEDTIME     enalapril  20 MG tablet TAKE 1 TABLET  TWICE DAILY    finasteride   5 mg Every evening   Garlic  1,000 mg  Daily   XALATAN 0.005 % ophth soln 1 drop, Both Eyes, Daily   Magnesium 250 mg 2 times daily, Breakfast and lunch   minoxidil 2.5 MG tablet TAKE 1-2 TABLETS EVERY MORNING    Multiple Vitamin  1 tablet  Daily   Omega-3  Fish Oil 1,200 mg Daily with lunch   omeprazole  20 MG capsule TAKE 1 CAPSULE  DAILY   Vitamin D  6,000 Units Daily   zinc 50 mg Daily     Allergies  Allergen Reactions   Adhesive [Tape] Other (See Comments)    reddness     Past Medical History:  Diagnosis Date   Benign localized prostatic hyperplasia with lower urinary tract symptoms (LUTS)    Bruises easily    Elevated PSA    GERD (gastroesophageal reflux disease)    History of gout    History of kidney stones    Hyperlipidemia    Hypertension    Nephrolithiasis    Nocturia    Pre-diabetes    Vitamin D deficiency    Wears dentures    full upper and lower partial     Health Maintenance  Topic Date Due   Hepatitis C Screening  Never done   COVID-19 Vaccine (3 - Booster for Pfizer series) 01/01/2020   TETANUS/TDAP  07/27/2027   HPV VACCINES  Aged Out   INFLUENZA VACCINE  Discontinued   PNA vac Low Risk Adult  Discontinued     Immunization History  Administered Date(s) Administered   DTaP 08/31/2005   PFIZER SARS-COV-2 Vacc 06/14/2019, 07/04/2019   PPD Test 03/17/2014   Td 07/26/2017    Last Colon - 12/26/2017 - Negative & Dr Leone Payor- recc no f/u due to age.   Past Surgical History:  Procedure Laterality Date   COLONOSCOPY  2008   CYSTOSCOPY WITH RETROGRADE PYELOGRAM, URETEROSCOPY AND STENT PLACEMENT Bilateral 04/19/2016    Procedure: CYSTOSCOPY WITH BILATERAL RETROGRADE URETEROSCOPY BASKET EXTRACTION;  Surgeon: Bjorn Pippin, MD;  Location: [facility];  Service: Urology;  Laterality: Bilateral;   EXTRACORPOREAL SHOCK WAVE LITHOTRIPSY  [phone]   HAND LIGAMENT RECONSTRUCTION Right 2009   PROSTATE BIOPSY  2000, 12/2004   negative     Family History  Problem Relation Age of Onset   Heart disease Mother    Diabetes Mother    Diabetes Father    Heart disease Father    Heart attack Father    Diabetes Sister    Colon cancer Sister    Diabetes Brother    Pancreatic cancer Brother    Diabetes Brother    Diabetes Brother    Heart disease Brother    Stroke Brother    Cancer Sister        breast   Breast cancer Sister    Cancer Sister        colon   Stomach cancer Neg Hx      Social History   Socioeconomic History   Marital status: Married    Spouse name: Talbert Forest   Number of children: None  Occupational History   Not on file  Tobacco Use   Smoking status: Former Smoker    Years: 5.00    Types: Cigarettes    Quit date: 05/23/1965    Years since quitting: 55.3   Smokeless tobacco: Former Neurosurgeon    Types: Chew    Quit date: 04/11/1966  Vaping Use   Vaping Use: Never used  Substance and Sexual Activity   Alcohol use: No   Drug use: No   Sexual activity: Not on file  Social History Narrative   The patient is married without children 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   1 caffeinated beverage daily.  Former user of smokeless tobacco and former smoker - no current smoking drug use or alcohol     ROS Constitutional: Denies fever, chills, weight loss/gain, headaches, insomnia,  night sweats or change in appetite. Does c/o fatigue. Eyes: Denies redness, blurred vision, diplopia, discharge, itchy or watery eyes.  ENT: Denies discharge, congestion, post nasal drip, epistaxis, sore throat, earache, hearing loss, dental  pain, Tinnitus, Vertigo, Sinus pain or snoring.  Cardio: Denies chest pain, palpitations, irregular heartbeat, syncope, dyspnea, diaphoresis, orthopnea, PND, claudication or edema Respiratory: denies cough, dyspnea, DOE, pleurisy, hoarseness, laryngitis or wheezing.  Gastrointestinal: Denies dysphagia, heartburn, reflux, water brash, pain, cramps, nausea, vomiting, bloating, diarrhea, constipation, hematemesis, melena, hematochezia, jaundice or hemorrhoids Genitourinary: Denies dysuria, frequency, urgency, nocturia, hesitancy, discharge, hematuria or flank pain Musculoskeletal: Denies arthralgia, myalgia, stiffness, Jt. Swelling, pain, limp or strain/sprain. Denies Falls. Skin: Denies puritis, rash, hives, warts, acne, eczema or change in skin lesion Neuro: No weakness, tremor, incoordination, spasms, paresthesia or pain Psychiatric: Denies confusion, memory loss or sensory loss. Denies Depression. Endocrine: Denies change in weight, skin, hair change, nocturia, and paresthesia, diabetic polys, visual blurring or hyper / hypo glycemic episodes.  Heme/Lymph: No excessive bleeding, bruising or enlarged lymph nodes.  Physical Exam  There were no vitals taken for this visit.  General Appearance: Well nourished and well groomed and in no apparent distress.  Eyes: PERRLA, EOMs, conjunctiva no swelling or erythema, normal fundi and vessels. Sinuses: No frontal/maxillary tenderness ENT/Mouth: EACs patent / TMs  nl. Nares clear without erythema, swelling, mucoid exudates. Oral hygiene is good. No erythema, swelling, or exudate. Tongue normal, non-obstructing. Tonsils not swollen or erythematous. Hearing normal.  Neck: Supple, thyroid not palpable. No bruits, nodes or JVD. Respiratory: Respiratory effort normal.  BS equal and clear bilateral without rales, rhonci, wheezing or stridor. Cardio: Heart sounds are normal with regular rate and rhythm and no murmurs, rubs or gallops. Peripheral pulses are  normal and equal bilaterally without edema. No aortic or femoral bruits. Chest: symmetric with normal excursions and percussion.  Abdomen: Soft, with Nl bowel sounds. Nontender, no guarding, rebound, hernias, masses, or organomegaly.  Lymphatics: Non tender without lymphadenopathy.  Musculoskeletal: Full ROM all peripheral extremities, joint stability, 5/5 strength, and normal gait. Skin: Warm and dry without rashes, lesions, cyanosis, clubbing or  ecchymosis.  Neuro: Cranial nerves intact, reflexes equal bilaterally. Normal muscle tone, no cerebellar symptoms. Sensation intact.  Pysch: Alert and oriented X 3 with normal affect, insight and judgment appropriate.   Assessment and Plan  1. Annual Preventative/Screening Exam    2. Essential hypertension  - EKG 12-Lead - [place], RETROPERITNL ABD,  LTD - Urinalysis, Routine w reflex microscopic - Microalbumin / creatinine urine ratio - CBC with Differential/Platelet - COMPLETE METABOLIC PANEL WITH GFR - Magnesium - TSH  3. Hyperlipidemia, mixed  - EKG 12-Lead - [place], RETROPERITNL ABD,  LTD - Lipid panel - TSH  4. Abnormal glucose  - EKG 12-Lead - [place], RETROPERITNL ABD,  LTD - Hemoglobin A1c - Insulin, random  5. Vitamin D deficiency  - VITAMIN D 25 Hydroxyl 6. Gout  - Uric acid  7. BPH with obstruction/lower urinary tract symptoms  - PSA  8. Prostate cancer screening  - PSA  9. Screening for colorectal cancer  - POC Hemoccult Bld/Stl   10. Gastroesophageal reflux disease  - CBC with Differential/Platelet  11. Screening for ischemic heart disease  - EKG 12-Lead  12. FHx: heart disease  - EKG 12-Lead - [place], RETROPERITNL ABD,  LTD  13. Former smoker  - EKG 12-Lead - [place], RETROPERITNL ABD,  LTD  14. Screening for AAA (aortic abdominal aneurysm)  - [place], RETROPERITNL ABD,  LTD  15. Medication management  - Urinalysis, Routine w reflex microscopic - Microalbumin / creatinine urine ratio - Uric acid - CBC  with Differential/Platelet - COMPLETE METABOLIC PANEL WITH GFR - Magnesium - Lipid panel - TSH - Hemoglobin A1c - Insulin, random - VITAMIN D 25 Hydroxyl          Patient was counseled in prudent diet, weight control to achieve/maintain BMI less than 25, BP monitoring, regular exercise and medications as discussed.  Discussed med effects and SE's. Routine screening labs and tests as requested with regular follow-up as recommended. Over 40 minutes of exam, counseling, chart review and high complex critical decision making was performed   Marinus Maw, MD

## 2023-03-19 NOTE — Patient Instructions (Signed)

## 2023-03-20 ENCOUNTER — Encounter: Payer: Self-pay | Admitting: Internal Medicine

## 2023-03-20 ENCOUNTER — Ambulatory Visit (INDEPENDENT_AMBULATORY_CARE_PROVIDER_SITE_OTHER): Payer: PPO | Admitting: Internal Medicine

## 2023-03-20 VITALS — BP 130/62 | HR 70 | Temp 97.5°F | Resp 16 | Ht 75.5 in | Wt 197.2 lb

## 2023-03-20 DIAGNOSIS — Z87891 Personal history of nicotine dependence: Secondary | ICD-10-CM

## 2023-03-20 DIAGNOSIS — M109 Gout, unspecified: Secondary | ICD-10-CM

## 2023-03-20 DIAGNOSIS — E782 Mixed hyperlipidemia: Secondary | ICD-10-CM | POA: Diagnosis not present

## 2023-03-20 DIAGNOSIS — I700 Atherosclerosis of aorta: Secondary | ICD-10-CM

## 2023-03-20 DIAGNOSIS — K219 Gastro-esophageal reflux disease without esophagitis: Secondary | ICD-10-CM | POA: Diagnosis not present

## 2023-03-20 DIAGNOSIS — R7309 Other abnormal glucose: Secondary | ICD-10-CM | POA: Diagnosis not present

## 2023-03-20 DIAGNOSIS — Z0000 Encounter for general adult medical examination without abnormal findings: Secondary | ICD-10-CM

## 2023-03-20 DIAGNOSIS — N401 Enlarged prostate with lower urinary tract symptoms: Secondary | ICD-10-CM | POA: Diagnosis not present

## 2023-03-20 DIAGNOSIS — Z1211 Encounter for screening for malignant neoplasm of colon: Secondary | ICD-10-CM

## 2023-03-20 DIAGNOSIS — Z8249 Family history of ischemic heart disease and other diseases of the circulatory system: Secondary | ICD-10-CM

## 2023-03-20 DIAGNOSIS — Z79899 Other long term (current) drug therapy: Secondary | ICD-10-CM | POA: Diagnosis not present

## 2023-03-20 DIAGNOSIS — Z136 Encounter for screening for cardiovascular disorders: Secondary | ICD-10-CM | POA: Diagnosis not present

## 2023-03-20 DIAGNOSIS — E559 Vitamin D deficiency, unspecified: Secondary | ICD-10-CM

## 2023-03-20 DIAGNOSIS — I10 Essential (primary) hypertension: Secondary | ICD-10-CM | POA: Diagnosis not present

## 2023-03-20 DIAGNOSIS — Z125 Encounter for screening for malignant neoplasm of prostate: Secondary | ICD-10-CM

## 2023-03-20 DIAGNOSIS — Z0001 Encounter for general adult medical examination with abnormal findings: Secondary | ICD-10-CM

## 2023-03-21 LAB — LIPID PANEL
Cholesterol: 170 mg/dL (ref <200)
HDL: 51 mg/dL (ref >=40)
LDL Cholesterol (Calc): 101 mg/dL — ABNORMAL HIGH
Non-HDL Cholesterol (Calc): 119 mg/dL (ref <130)
Total CHOL/HDL Ratio: 3.3 (calc) (ref <5.0)
Triglycerides: 90 mg/dL (ref <150)

## 2023-03-21 LAB — CBC WITH DIFFERENTIAL/PLATELET
Absolute Lymphocytes: 2017 {cells}/uL (ref 850–3900)
Absolute Monocytes: 730 {cells}/uL (ref 200–950)
Basophils Absolute: 55 {cells}/uL (ref 0–200)
Basophils Relative: 0.5 %
Eosinophils Absolute: 109 {cells}/uL (ref 15–500)
Eosinophils Relative: 1 %
HCT: 43.5 % (ref 38.5–50.0)
Hemoglobin: 14.3 g/dL (ref 13.2–17.1)
MCH: 30.9 pg (ref 27.0–33.0)
MCHC: 32.9 g/dL (ref 32.0–36.0)
MCV: 94 fL (ref 80.0–100.0)
MPV: 9.7 fL (ref 7.5–12.5)
Monocytes Relative: 6.7 %
Neutro Abs: 7990 {cells}/uL — ABNORMAL HIGH (ref 1500–7800)
Neutrophils Relative %: 73.3 %
Platelets: 302 10*3/uL (ref 140–400)
RBC: 4.63 10*6/uL (ref 4.20–5.80)
RDW: 12 % (ref 11.0–15.0)
Total Lymphocyte: 18.5 %
WBC: 10.9 10*3/uL — ABNORMAL HIGH (ref 3.8–10.8)

## 2023-03-21 LAB — COMPLETE METABOLIC PANEL WITH GFR
AG Ratio: 1.5 (calc) (ref 1.0–2.5)
ALT: 20 U/L (ref 9–46)
AST: 19 U/L (ref 10–35)
Albumin: 4.2 g/dL (ref 3.6–5.1)
Alkaline phosphatase (APISO): 76 U/L (ref 35–144)
BUN: 19 mg/dL (ref 7–25)
CO2: 30 mmol/L (ref 20–32)
Calcium: 9.9 mg/dL (ref 8.6–10.3)
Chloride: 99 mmol/L (ref 98–110)
Creat: 0.76 mg/dL (ref 0.70–1.22)
Globulin: 2.8 g/dL (ref 1.9–3.7)
Glucose, Bld: 93 mg/dL (ref 65–99)
Potassium: 4.5 mmol/L (ref 3.5–5.3)
Sodium: 140 mmol/L (ref 135–146)
Total Bilirubin: 0.5 mg/dL (ref 0.2–1.2)
Total Protein: 7 g/dL (ref 6.1–8.1)
eGFR: 90 mL/min/{1.73_m2} (ref >=60)

## 2023-03-21 LAB — URINALYSIS, ROUTINE W REFLEX MICROSCOPIC
Bilirubin Urine: NEGATIVE
Glucose, UA: NEGATIVE
Hgb urine dipstick: NEGATIVE
Ketones, ur: NEGATIVE
Leukocytes,Ua: NEGATIVE
Nitrite: NEGATIVE
Protein, ur: NEGATIVE
Specific Gravity, Urine: 1.01 (ref 1.001–1.035)
pH: 7 (ref 5.0–8.0)

## 2023-03-21 LAB — TSH: TSH: 1.49 m[IU]/L (ref 0.40–4.50)

## 2023-03-21 LAB — MICROALBUMIN / CREATININE URINE RATIO
Creatinine, Urine: 48 mg/dL (ref 20–320)
Microalb Creat Ratio: 15 mg/g{creat} (ref <30)
Microalb, Ur: 0.7 mg/dL

## 2023-03-21 LAB — HEMOGLOBIN A1C
Hgb A1c MFr Bld: 5.8 %{Hb} — ABNORMAL HIGH (ref <5.7)
Mean Plasma Glucose: 120 mg/dL
eAG (mmol/L): 6.6 mmol/L

## 2023-03-21 LAB — PSA: PSA: 1.33 ng/mL (ref <=4.00)

## 2023-03-21 LAB — MAGNESIUM: Magnesium: 2 mg/dL (ref 1.5–2.5)

## 2023-03-21 LAB — INSULIN, RANDOM: Insulin: 7 u[IU]/mL

## 2023-03-21 LAB — URIC ACID: Uric Acid, Serum: 5.8 mg/dL (ref 4.0–8.0)

## 2023-03-21 LAB — VITAMIN D 25 HYDROXY (VIT D DEFICIENCY, FRACTURES): Vit D, 25-Hydroxy: 91 ng/mL (ref 30–100)

## 2023-03-21 NOTE — Progress Notes (Signed)
<>*<>*<>*<>*<>*<>*<>*<>*<>*<>*<>*<>*<>*<>*<>*<>*<>*<>*<>*<>*<>*<>*<>*<>*<> <>*<>*<>*<>*<>*<>*<>*<>*<>*<>*<>*<>*<>*<>*<>*<>*<>*<>*<>*<>*<>*<>*<>*<>*<>  -Test results slightly outside the reference range are not unusual. If there is anything important, I will review this with you,  otherwise it is considered normal test values.  If you have further questions,  please do not hesitate to contact me at the office or via My Chart.   <>*<>*<>*<>*<>*<>*<>*<>*<>*<>*<>*<>*<>*<>*<>*<>*<>*<>*<>*<>*<>*<>*<>*<>*<> <>*<>*<>*<>*<>*<>*<>*<>*<>*<>*<>*<>*<>*<>*<>*<>*<>*<>*<>*<>*<>*<>*<>*<>*<>  -   PSA = 1.35  remains low   <>*<>*<>*<>*<>*<>*<>*<>*<>*<>*<>*<>*<>*<>*<>*<>*<>*<>*<>*<>*<>*<>*<>*<>*<> <>*<>*<>*<>*<>*<>*<>*<>*<>*<>*<>*<>*<>*<>*<>*<>*<>*<>*<>*<>*<>*<>*<>*<>*<>  -   Chol = 170    Excellent   - Very low risk for Heart Attack  / Stroke  <>*<>*<>*<>*<>*<>*<>*<>*<>*<>*<>*<>*<>*<>*<>*<>*<>*<>*<>*<>*<>*<>*<>*<>*<> <>*<>*<>*<>*<>*<>*<>*<>*<>*<>*<>*<>*<>*<>*<>*<>*<>*<>*<>*<>*<>*<>*<>*<>*<>  -   A1c = 5.8%  still borderline high sugar    Being diabetic has a  300% increased risk for heart attack,                                                        stroke, cancer, and alzheimer- type vascular dementia.   It is very important that you work harder with diet by                                         avoiding all foods that are white except chicken, fish & calliflower.  - Avoid white rice  (brown & wild rice is OK),   - Avoid white potatoes  (sweet potatoes in moderation is OK),   White bread or wheat bread or anything made out of   white flour like bagels, donuts, rolls, buns, biscuits, cakes,  - pastries, cookies, pizza crust, and pasta (made from  white flour & egg whites)   - vegetarian pasta or spinach or wheat pasta is OK.  - Multigrain breads like Arnold's, Pepperidge Farm or   multigrain sandwich thins or high fiber breads like   Eureka bread or "Dave's Killer" breads that are   4 to 5 grams fiber per slice !  are best.    Diet, exercise and weight loss can reverse and cure  diabetes in the early stages.    <>*<>*<>*<>*<>*<>*<>*<>*<>*<>*<>*<>*<>*<>*<>*<>*<>*<>*<>*<>*<>*<>*<>*<>*<> <>*<>*<>*<>*<>*<>*<>*<>*<>*<>*<>*<>*<>*<>*<>*<>*<>*<>*<>*<>*<>*<>*<>*<>*<>  -   Vitamin D = 91  - Excellent - Please keep dose same   - Vitamin D goal is between 70-100.   - It is very important as a natural anti-inflammatory and helping the                           immune system protect against viral infections, like the Covid-19    helping hair, skin, and nails, as well as reducing stroke and heart attack risk.   - It helps your bones and helps with mood.  - It also decreases numerous cancer risks so please    - Low Vit D is associated with a 200-300% higher risk for CANCER   and 200-300% higher risk for HEART   ATTACK  &  STROKE.    - It is also associated with higher death rate at younger ages,   autoimmune diseases like Rheumatoid arthritis, Lupus, Multiple Sclerosis.     - Also many other serious conditions, like depression, Alzheimer's  Dementia,  muscle aches, fatigue, fibromyalgia   <>*<>*<>*<>*<>*<>*<>*<>*<>*<>*<>*<>*<>*<>*<>*<>*<>*<>*<>*<>*<>*<>*<>*<>*<> <>*<>*<>*<>*<>*<>*<>*<>*<>*<>*<>*<>*<>*<>*<>*<>*<>*<>*<>*<>*<>*<>*<>*<>*<>  -   Uric Acid  / Gout test is Normal & OK - Please continue Allopurinol  same   <>*<>*<>*<>*<>*<>*<>*<>*<>*<>*<>*<>*<>*<>*<>*<>*<>*<>*<>*<>*<>*<>*<>*<>*<> <>*<>*<>*<>*<>*<>*<>*<>*<>*<>*<>*<>*<>*<>*<>*<>*<>*<>*<>*<>*<>*<>*<>*<>*<>  -   All Else - CBC - Kidneys - Electrolytes - Liver - Magnesium & Thyroid    - all  Normal / OK  <>*<>*<>*<>*<>*<>*<>*<>*<>*<>*<>*<>*<>*<>*<>*<>*<>*<>*<>*<>*<>*<>*<>*<>*<> <>*<>*<>*<>*<>*<>*<>*<>*<>*<>*<>*<>*<>*<>*<>*<>*<>*<>*<>*<>*<>*<>*<>*<>*<>                                                                 (Copied to Dr Annabell Howells)

## 2023-03-29 DIAGNOSIS — N200 Calculus of kidney: Secondary | ICD-10-CM | POA: Diagnosis not present

## 2023-03-29 DIAGNOSIS — N401 Enlarged prostate with lower urinary tract symptoms: Secondary | ICD-10-CM | POA: Diagnosis not present

## 2023-03-29 DIAGNOSIS — R102 Pelvic and perineal pain: Secondary | ICD-10-CM | POA: Diagnosis not present

## 2023-03-29 DIAGNOSIS — R351 Nocturia: Secondary | ICD-10-CM | POA: Diagnosis not present

## 2023-04-05 ENCOUNTER — Encounter: Payer: Self-pay | Admitting: Internal Medicine

## 2023-04-07 DIAGNOSIS — L570 Actinic keratosis: Secondary | ICD-10-CM | POA: Diagnosis not present

## 2023-04-07 DIAGNOSIS — L219 Seborrheic dermatitis, unspecified: Secondary | ICD-10-CM | POA: Diagnosis not present

## 2023-04-18 ENCOUNTER — Encounter: Payer: PPO | Admitting: Internal Medicine

## 2023-05-31 ENCOUNTER — Other Ambulatory Visit: Payer: Self-pay

## 2023-05-31 DIAGNOSIS — K219 Gastro-esophageal reflux disease without esophagitis: Secondary | ICD-10-CM

## 2023-05-31 DIAGNOSIS — Z1211 Encounter for screening for malignant neoplasm of colon: Secondary | ICD-10-CM

## 2023-05-31 DIAGNOSIS — Z1212 Encounter for screening for malignant neoplasm of rectum: Secondary | ICD-10-CM

## 2023-05-31 LAB — POC HEMOCCULT BLD/STL (HOME/3-CARD/SCREEN)
Card #2 Fecal Occult Blod, POC: NEGATIVE
Card #3 Fecal Occult Blood, POC: NEGATIVE
Fecal Occult Blood, POC: NEGATIVE

## 2023-06-09 DIAGNOSIS — H401131 Primary open-angle glaucoma, bilateral, mild stage: Secondary | ICD-10-CM | POA: Diagnosis not present

## 2023-06-14 ENCOUNTER — Other Ambulatory Visit: Payer: Self-pay | Admitting: Nurse Practitioner

## 2023-06-14 DIAGNOSIS — K219 Gastro-esophageal reflux disease without esophagitis: Secondary | ICD-10-CM

## 2023-06-20 ENCOUNTER — Other Ambulatory Visit: Payer: Self-pay | Admitting: Nurse Practitioner

## 2023-07-19 ENCOUNTER — Ambulatory Visit: Payer: PPO | Admitting: Nurse Practitioner

## 2023-07-31 ENCOUNTER — Ambulatory Visit: Payer: PPO | Admitting: Family Medicine

## 2023-08-01 ENCOUNTER — Encounter: Payer: Self-pay | Admitting: Family Medicine

## 2023-08-01 NOTE — Progress Notes (Unsigned)
   Michael Kimberlin T. Piper Albro, MD, CAQ Sports Medicine [facility] at [facility] [address] [place], [address]  Phone: [phone]  FAX: [phone]  Michael Waller - 83 y.o. male  MRN [id number]  Date of Birth: [birth year]  Date: 08/02/2023  PCP: Michael Cowboy, MD  Referral: Michael Cowboy, MD  No chief complaint on file.  Subjective:   Michael Waller is a 83 y.o. very pleasant male patient with There is no height or weight on file to calculate BMI. who presents with the following:  The patient is here for a new patient evaluation.  He had previously been a patient of Dr. Oneta Rack, who recently passed away.  For his blood pressure, he does take bisoprolol 2.5 mg, hydrochlorothiazide 6.25 mg, enalapril 20 mg, doxazosin 8 mg.  For his prostate, he does take finasteride 5 mg a day.  He also does have prediabetes.  High cholesterol, he does take Lipitor 10 mg.    Review of Systems is noted in the HPI, as appropriate  Objective:   There were no vitals taken for this visit.  GEN: No acute distress; alert,appropriate. PULM: Breathing comfortably in no respiratory distress PSYCH: Normally interactive.   Laboratory and Imaging Data:  Assessment and Plan:   ***

## 2023-08-02 ENCOUNTER — Ambulatory Visit (INDEPENDENT_AMBULATORY_CARE_PROVIDER_SITE_OTHER): Payer: PPO | Admitting: Family Medicine

## 2023-08-02 ENCOUNTER — Encounter: Payer: Self-pay | Admitting: Family Medicine

## 2023-08-02 VITALS — BP 150/60 | HR 59 | Temp 97.8°F | Ht 75.5 in | Wt 202.1 lb

## 2023-08-02 DIAGNOSIS — J301 Allergic rhinitis due to pollen: Secondary | ICD-10-CM

## 2023-08-02 DIAGNOSIS — R7303 Prediabetes: Secondary | ICD-10-CM | POA: Diagnosis not present

## 2023-08-02 DIAGNOSIS — E782 Mixed hyperlipidemia: Secondary | ICD-10-CM | POA: Diagnosis not present

## 2023-08-02 DIAGNOSIS — K219 Gastro-esophageal reflux disease without esophagitis: Secondary | ICD-10-CM | POA: Diagnosis not present

## 2023-08-02 DIAGNOSIS — I10 Essential (primary) hypertension: Secondary | ICD-10-CM

## 2023-08-02 DIAGNOSIS — M1A00X0 Idiopathic chronic gout, unspecified site, without tophus (tophi): Secondary | ICD-10-CM | POA: Diagnosis not present

## 2023-08-17 IMAGING — CR DG CHEST 2V
2 series · 2 of 2 positions shown · non-contrast
Comparison: 05/21/2013 .

CLINICAL DATA: Syncope

EXAM:
CHEST - 2 VIEW

[x chest ap]
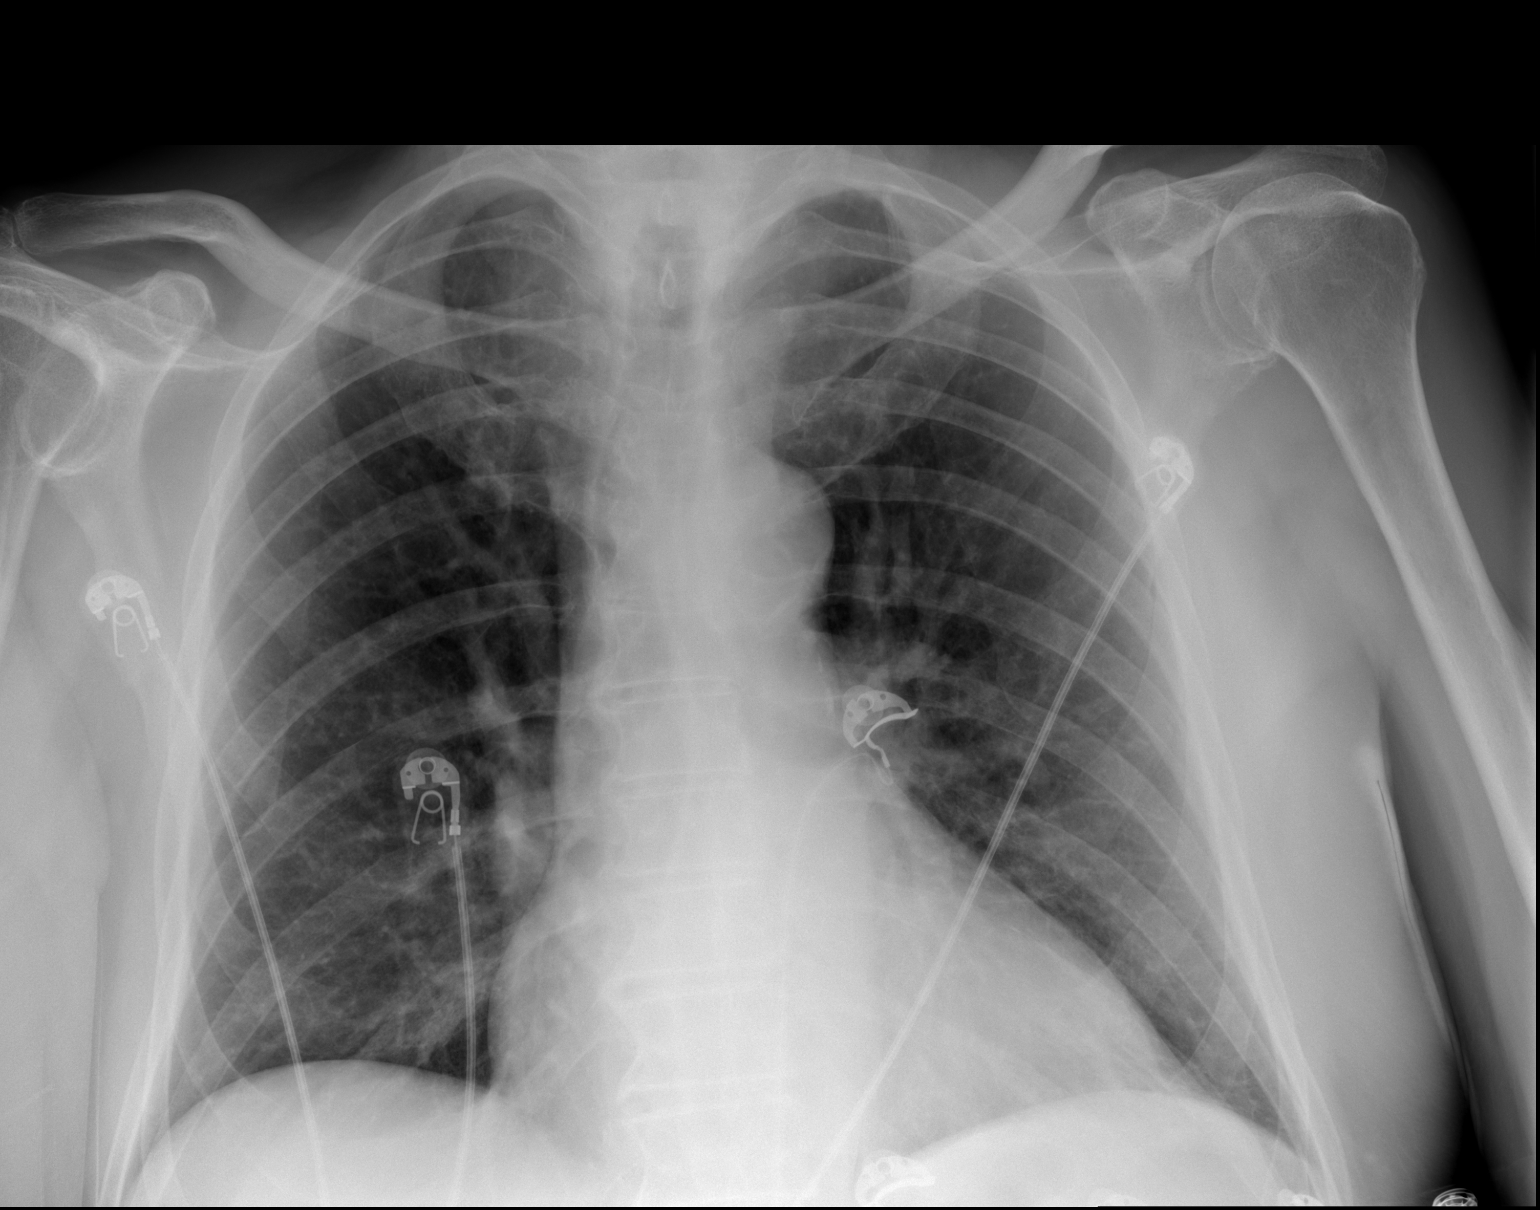

[w chest lat]
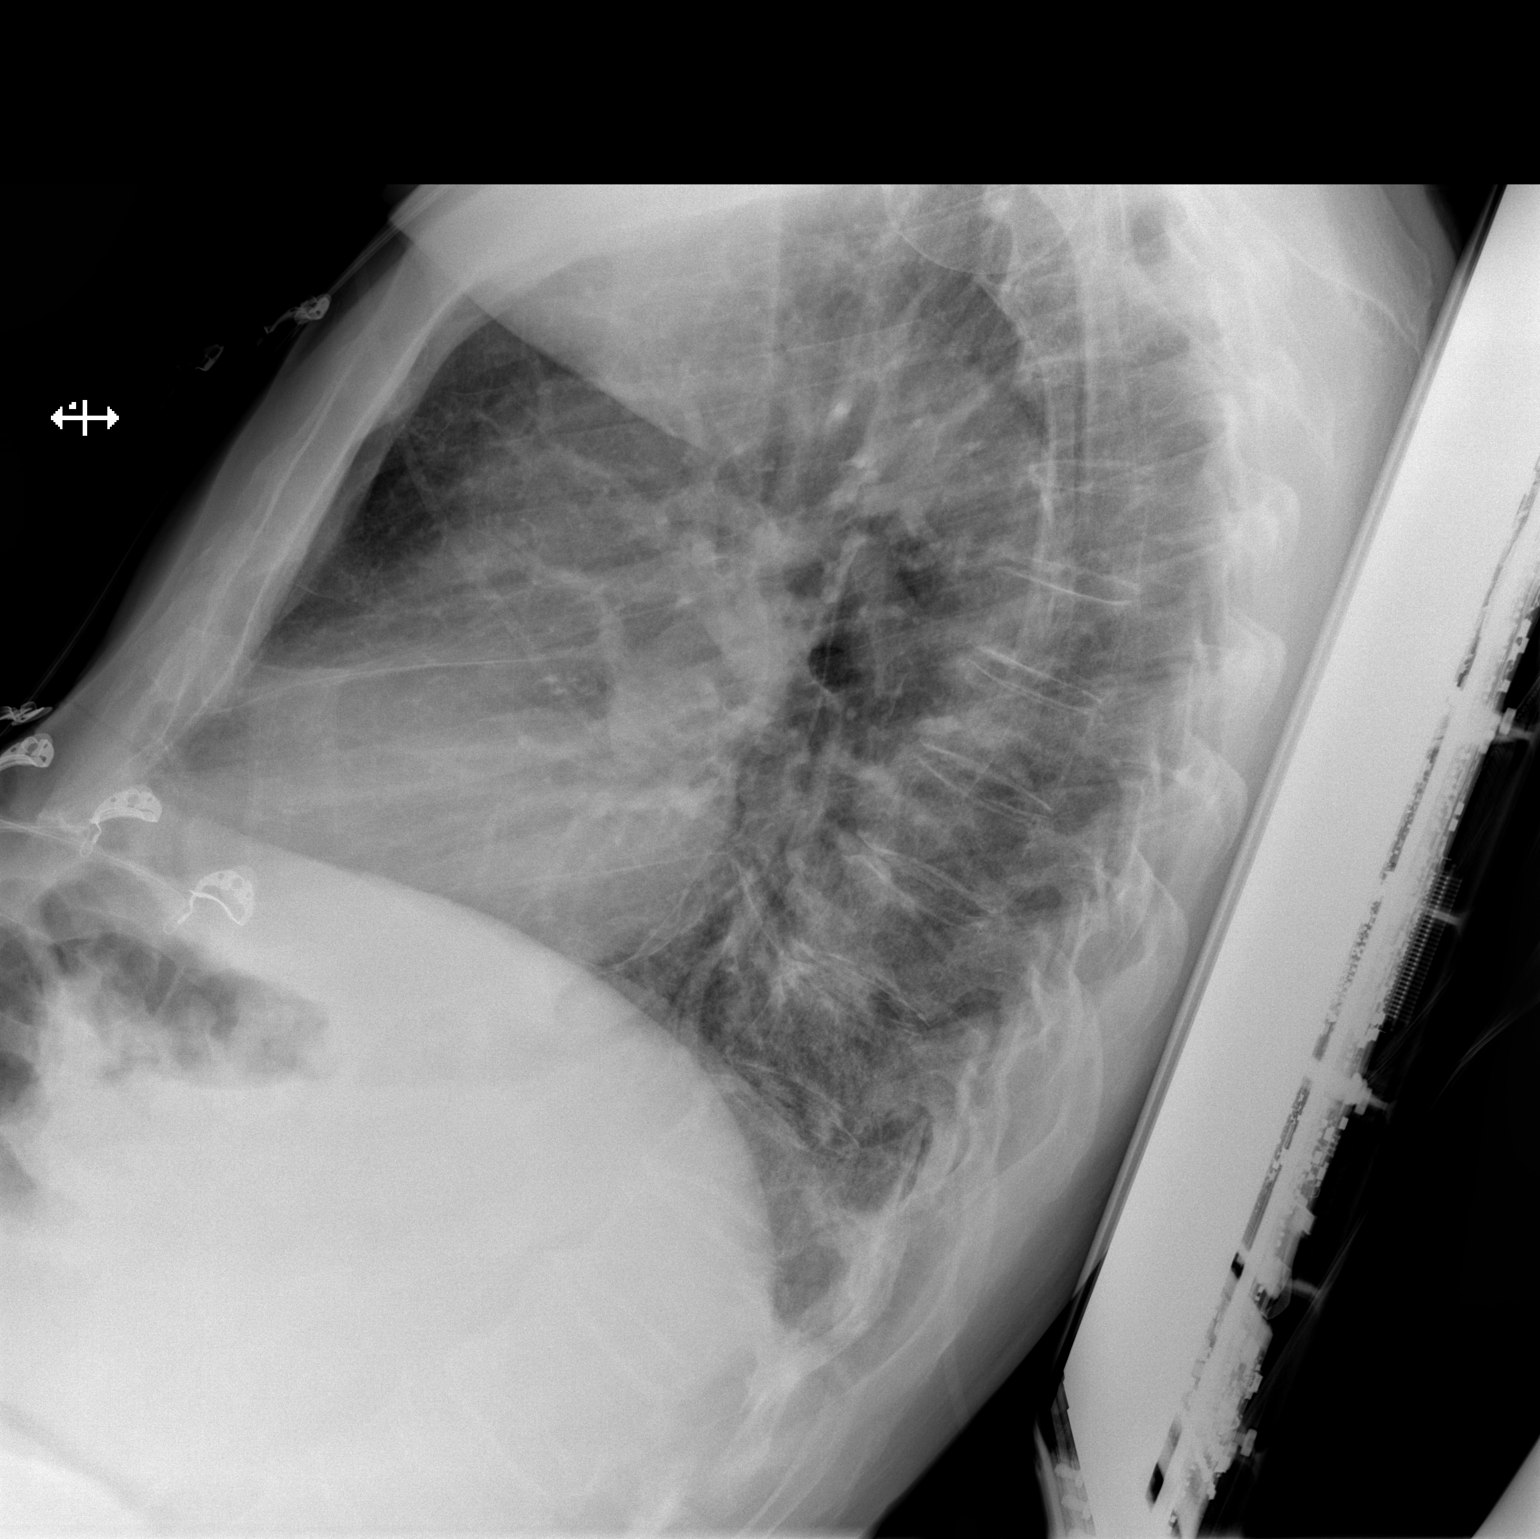

[2 of 2 positions shown; findings below may reference images not displayed]

FINDINGS: The heart size and mediastinal contours are within normal limits.
Aortic atherosclerosis. Both lungs are clear. The visualized
skeletal structures are unremarkable.
IMPRESSION: No active cardiopulmonary disease.

## 2023-08-21 ENCOUNTER — Other Ambulatory Visit: Payer: Self-pay | Admitting: Family Medicine

## 2023-08-21 NOTE — Telephone Encounter (Unsigned)
 Copied from CRM [id number]. Topic: Clinical - Medication Refill >> Aug 21, 2023  1:07 PM Pascal Lux wrote: Most Recent Primary Care Visit:  Provider: Hannah Beat  Department: [facility]  Visit Type: NEW PT - OFFICE VISIT  Date: 08/02/2023  Medication: doxazosin (CARDURA) 8 MG tablet [[id number]]  Has the patient contacted their pharmacy? Yes (Agent: If no, request that the patient contact the pharmacy for the refill. If patient does not wish to contact the pharmacy document the reason why and proceed with request.) (Agent: If yes, when and what did the pharmacy advise?) Told to call provider  Is this the correct pharmacy for this prescription? Yes If no, delete pharmacy and type the correct one.  This is the patient's preferred pharmacy:  [facility] - [place], [place] - [address] [place] [address] Phone: [phone] Fax: [phone]   Has the prescription been filled recently? No  Is the patient out of the medication? No  Has the patient been seen for an appointment in the last year OR does the patient have an upcoming appointment? Yes  Can we respond through MyChart? No  Agent: Please be advised that Rx refills may take up to 3 business days. We ask that you follow-up with your pharmacy.

## 2023-08-22 DIAGNOSIS — H401131 Primary open-angle glaucoma, bilateral, mild stage: Secondary | ICD-10-CM | POA: Diagnosis not present

## 2023-08-22 MED ORDER — DOXAZOSIN MESYLATE 8 MG PO TABS: ORAL_TABLET | ORAL | 3 refills | Status: AC

## 2023-09-07 DIAGNOSIS — L570 Actinic keratosis: Secondary | ICD-10-CM | POA: Diagnosis not present

## 2023-09-07 DIAGNOSIS — L821 Other seborrheic keratosis: Secondary | ICD-10-CM | POA: Diagnosis not present

## 2023-09-07 DIAGNOSIS — Z85828 Personal history of other malignant neoplasm of skin: Secondary | ICD-10-CM | POA: Diagnosis not present

## 2023-09-07 DIAGNOSIS — D225 Melanocytic nevi of trunk: Secondary | ICD-10-CM | POA: Diagnosis not present

## 2023-09-07 DIAGNOSIS — L578 Other skin changes due to chronic exposure to nonionizing radiation: Secondary | ICD-10-CM | POA: Diagnosis not present

## 2023-09-07 DIAGNOSIS — L814 Other melanin hyperpigmentation: Secondary | ICD-10-CM | POA: Diagnosis not present

## 2023-09-19 ENCOUNTER — Ambulatory Visit (INDEPENDENT_AMBULATORY_CARE_PROVIDER_SITE_OTHER)

## 2023-09-19 VITALS — BP 150/60 | Ht 75.5 in | Wt 200.0 lb

## 2023-09-19 DIAGNOSIS — Z2821 Immunization not carried out because of patient refusal: Secondary | ICD-10-CM

## 2023-09-19 DIAGNOSIS — Z0000 Encounter for general adult medical examination without abnormal findings: Secondary | ICD-10-CM

## 2023-09-19 NOTE — Patient Instructions (Signed)
 Mr. Michael Waller , Thank you for taking time to come for your Medicare Wellness Visit. I appreciate your ongoing commitment to your health goals. Please review the following plan we discussed and let me know if I can assist you in the future.   Referrals/Orders/Follow-Ups/Clinician Recommendations: follow up as scheduled for next AWV.  This is a list of the screening recommended for you and due dates:  Health Maintenance  Topic Date Due   COVID-19 Vaccine (3 - Pfizer risk series) 08/01/2019   Zoster (Shingles) Vaccine (1 of 2) 11/02/2023*   Pneumonia Vaccine (1 of 1 - PCV) 08/01/2024*   Flu Shot  12/22/2023   Medicare Annual Wellness Visit  09/18/2024   DTaP/Tdap/Td vaccine (3 - Tdap) 07/27/2027   HPV Vaccine  Aged Out   Meningitis B Vaccine  Aged Out  *Topic was postponed. The date shown is not the original due date.    Advanced directives: (Declined) Advance directive discussed with you today. Even though you declined this today, please call our office should you change your mind, and we can give you the proper paperwork for you to fill out.  Next Medicare Annual Wellness Visit scheduled for next year: Yes

## 2023-09-19 NOTE — Progress Notes (Signed)
 Because this visit was a virtual/telehealth visit,  certain criteria was not obtained, such a blood pressure, CBG if applicable, and timed get up and go. Any medications not marked as "taking" were not mentioned during the medication reconciliation part of the visit. Any vitals not documented were not able to be obtained due to this being a telehealth visit or patient was unable to self-report a recent blood pressure reading due to a lack of equipment at home via telehealth. Vitals that have been documented are verbally provided by the patient.   This visit was performed by a medical professional under my direct supervision. I was immediately available for consultation/collaboration. I have reviewed and agree with the Annual Wellness Visit documentation.  Subjective:   Michael Waller is a 83 y.o. who presents for a Medicare Wellness preventive visit.  Visit Complete: Virtual I connected with  Michael Waller on 09/19/23 by a audio enabled telemedicine application and verified that I am speaking with the correct person using two identifiers.  Patient Location: Home  Provider Location: Home Office  I discussed the limitations of evaluation and management by telemedicine. The patient expressed understanding and agreed to proceed.  Vital Signs: Because this visit was a virtual/telehealth visit, some criteria may be missing or patient reported. Any vitals not documented were not able to be obtained and vitals that have been documented are patient reported.  VideoDeclined- This patient declined Librarian, academic. Therefore the visit was completed with audio only.  Persons Participating in Visit: Patient.  AWV Questionnaire: No: Patient Medicare AWV questionnaire was not completed prior to this visit.  Cardiac Risk Factors include: advanced age (>88men, >53 women);dyslipidemia;male gender;hypertension     Objective:    Today's Vitals   09/19/23 0916  BP: (!)  150/60  Weight: 200 lb (90.7 kg)  Height: 6' 3.5" (1.918 m)   Body mass index is 24.67 kg/m.     09/19/2023    9:22 AM 07/25/2022   10:14 AM 12/21/2021    9:55 AM 08/12/2021    8:30 AM 03/31/2021    9:49 AM 03/11/2020   10:30 AM 05/23/2019   10:09 AM  Advanced Directives  Does Patient Have a Medical Advance Directive? No No No No No No No  Does patient want to make changes to medical advance directive?       No - Patient declined  Would patient like information on creating a medical advance directive? No - Patient declined  No - Patient declined No - Patient declined No - Patient declined Yes (MAU/Ambulatory/Procedural Areas - Information given)     Current Medications (verified) Outpatient Encounter Medications as of 09/19/2023  Medication Sig   acetaminophen  (TYLENOL ) 500 MG tablet Take 1,000 mg by mouth every 6 (six) hours as needed for mild pain.   Alfalfa 500 MG TABS Take 1,000 mg by mouth daily.   allopurinol  (ZYLOPRIM ) 300 MG tablet Take 150 mg by mouth daily.   aspirin  81 MG tablet Take 81 mg by mouth daily.   atorvastatin  (LIPITOR) 10 MG tablet TAKE 1 TABLET BY MOUTH DAILY FOR CHOLESTEROL   bisoprolol -hydrochlorothiazide  (ZIAC ) 2.5-6.25 MG tablet Take 0.5 tablets by mouth daily.   blood glucose meter kit and supplies Dispense based insurance preference. E11.9   Cholecalciferol (VITAMIN D3) 2000 units TABS Take 6,000 Units by mouth daily.   Cinnamon 500 MG TABS Take 1,000 mg by mouth daily.   doxazosin  (CARDURA ) 8 MG tablet TAKE 1/2 TABLET BY MOUTH AT BEDTIME FOR  BLOOD PRESSURE AND PROSTATE   enalapril  (VASOTEC ) 20 MG tablet Take 10 mg by mouth daily.   finasteride  (PROSCAR ) 5 MG tablet Take 5 mg by mouth every evening.    Garlic 1000 MG CAPS Take 1,000 mg by mouth daily.   glucose blood (ONE TOUCH ULTRA TEST) test strip USE TO CHECK BLOOD SUGAR ONCE DAILY   glucose blood (ONETOUCH ULTRA) test strip Check   Blood Sugar   Daily  (Dx: e11.9)   glucose blood test strip Check  blood sugar once daily   latanoprost  (XALATAN ) 0.005 % ophthalmic solution Place 1 drop into both eyes daily.   loratadine (EQ ALLERGY RELIEF) 10 MG tablet Take 10 mg by mouth daily as needed for allergies.   Magnesium  250 MG TABS Take 250 mg by mouth 2 (two) times daily. Breakfast and lunch   minoxidil  (LONITEN ) 2.5 MG tablet TAKE 1-2 TABLETS BY MOUTH EVERY MORNING FOR BLOOD PRESSURE   Multiple Vitamin (MULTIVITAMIN) tablet Take 1 tablet by mouth daily.   Omega-3 Fatty Acids (OMEGA-3 FISH OIL) 1200 MG CAPS Take 1,200 mg by mouth daily with lunch.   omeprazole  (PRILOSEC) 20 MG capsule TAKE 1 CAPSULE BY MOUTH DAILY   OneTouch Delica Lancets 33G MISC Check blood sugar 1 time a day.   zinc gluconate 50 MG tablet Take 50 mg by mouth daily.   No facility-administered encounter medications on file as of 09/19/2023.    Allergies (verified) Adhesive [tape]   History: Past Medical History:  Diagnosis Date   Benign localized prostatic hyperplasia with lower urinary tract symptoms (LUTS)    Elevated PSA    GERD (gastroesophageal reflux disease)    History of gout    History of kidney stones    Hyperlipidemia    Hypertension    Nephrolithiasis    Nocturia    Pre-diabetes    Vitamin D  deficiency    Past Surgical History:  Procedure Laterality Date   COLONOSCOPY  2008   CYSTOSCOPY WITH RETROGRADE PYELOGRAM, URETEROSCOPY AND STENT PLACEMENT Bilateral 04/19/2016   Procedure: CYSTOSCOPY WITH BILATERAL RETROGRADE URETEROSCOPY BASKET EXTRACTION;  Surgeon: Homero Luster, MD;  Location: [facility];  Service: Urology;  Laterality: Bilateral;   EXTRACORPOREAL SHOCK WAVE LITHOTRIPSY  [phone]   HAND LIGAMENT RECONSTRUCTION Right 2009   PROSTATE BIOPSY  2000, 12/2004   negative   Family History  Problem Relation Age of Onset   Heart disease Mother    Diabetes Mother    Diabetes Father    Heart disease Father    Heart attack Father    Diabetes Sister    Colon cancer Sister     Diabetes Brother    Pancreatic cancer Brother    Diabetes Brother    Diabetes Brother    Heart disease Brother    Stroke Brother    Cancer Sister        breast   Breast cancer Sister    Cancer Sister        colon   Stomach cancer Neg Hx    Social History   Socioeconomic History   Marital status: Married    Spouse name: Not on file   Number of children: Not on file   Years of education: Not on file   Highest education level: Associate degree: occupational, Scientist, product/process development, or vocational program  Occupational History   Not on file  Tobacco Use   Smoking status: Former    Current packs/day: 0.00    Types: Cigarettes    Start  date: 05/23/1960    Quit date: 05/23/1965    Years since quitting: 58.3   Smokeless tobacco: Former    Types: Chew    Quit date: 04/11/1966  Vaping Use   Vaping status: Never Used  Substance and Sexual Activity   Alcohol use: No   Drug use: No   Sexual activity: Not on file  Other Topics Concern   Not on file  Social History Narrative   The patient is married without children 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   1 caffeinated beverage daily.   Former user of smokeless tobacco and former smoker no current smoking drug use or alcohol   Social Drivers of Corporate investment banker Strain: Low Risk  (09/19/2023)   Overall Financial Resource Strain (CARDIA)    Difficulty of Paying Living Expenses: Not hard at all  Food Insecurity: No Food Insecurity (09/19/2023)   Hunger Vital Sign    Worried About Running Out of Food in the Last Year: Never true    Ran Out of Food in the Last Year: Never true  Transportation Needs: No Transportation Needs (09/19/2023)   PRAPARE - Administrator, Civil Service (Medical): No    Lack of Transportation (Non-Medical): No  Physical Activity: Insufficiently Active (09/19/2023)   Exercise Vital Sign    Days of Exercise per Week: 4 days    Minutes of  Exercise per Session: 30 min  Stress: No Stress Concern Present (09/19/2023)   [employer] of Occupational Health - Occupational Stress Questionnaire    Feeling of Stress : Not at all  Social Connections: Socially Integrated (09/19/2023)   Social Connection and Isolation Panel [NHANES]    Frequency of Communication with Friends and Family: More than three times a week    Frequency of Social Gatherings with Friends and Family: Twice a week    Attends Religious Services: More than 4 times per year    Active Member of [employer] or Organizations: Yes    Attends Banker Meetings: Never    Marital Status: Married    Tobacco Counseling Counseling given: Not Answered    Clinical Intake:  Pre-visit preparation completed: Yes  Pain : No/denies pain     BMI - recorded: 24.67 Nutritional Status: BMI of 19-24  Normal Nutritional Risks: None Diabetes: No  Lab Results  Component Value Date   HGBA1C 5.8 (H) 03/20/2023   HGBA1C 5.7 (H) 10/31/2022   HGBA1C 5.9 (H) 07/25/2022     How often do you need to have someone help you when you read instructions, pamphlets, or other written materials from your doctor or pharmacy?: 1 - Never     Information entered by :: [employer]   Activities of Daily Living     09/19/2023    9:21 AM 03/19/2023   11:16 PM  In your present state of health, do you have any difficulty performing the following activities:  Hearing? 1 0  Comment wears hearing aids   Vision? 0 0  Difficulty concentrating or making decisions? 0 0  Walking or climbing stairs? 0 0  Dressing or bathing? 0   Doing errands, shopping? 0 0  Preparing Food and eating ? N   Using the Toilet? N   In the past six months, have you accidently leaked urine? Y   Do you have problems with loss of bowel control? N   Managing your Medications?  N   Managing your Finances? N   Housekeeping or managing your Housekeeping? N     Patient Care Team: Scherrie Curt,  MD as PCP - General (Family Medicine) Euell Herrlich, MD as PCP - Cardiology (Cardiology) [place], Thornell Flirt, MD as Referring Physician (Dermatology) Kenney Peacemaker, MD as Consulting Physician (Gastroenterology) Homero Luster, MD as Attending Physician (Urology) Princella Brooklyn, OD (Optometry)  Indicate any recent Medical Services you may have received from other than [facility] providers in the past year (date may be approximate).     Assessment:   This is a routine wellness examination for Michael Waller.  Hearing/Vision screen Hearing Screening - Comments:: Patient wears hearing aids Vision Screening - Comments:: Patient wears glasses   Goals Addressed               This Visit's Progress     Patient Stated (pt-stated)        Patient states his biggest goal is to go to heaven in the end        Depression Screen     09/19/2023    9:23 AM 03/19/2023   11:15 PM 10/31/2022   10:41 PM 07/25/2022   10:14 AM 04/12/2022   11:35 PM 12/21/2021    9:58 AM 03/31/2021    9:49 AM  PHQ 2/9 Scores  PHQ - 2 Score 0 0 0 0 0 0 0  PHQ- 9 Score 2          Fall Risk     09/19/2023    9:22 AM 03/19/2023   11:15 PM 10/31/2022   10:41 PM 07/25/2022   10:14 AM 04/12/2022   11:35 PM  Fall Risk   Falls in the past year? 0 0 0 0 0  Number falls in past yr: 0      Injury with Fall? 0      Risk for fall due to : No Fall Risks No Fall Risks No Fall Risks  No Fall Risks  Follow up Falls prevention discussed;Falls evaluation completed Falls prevention discussed;Education provided;Falls evaluation completed Falls prevention discussed;Education provided;Falls evaluation completed  Falls prevention discussed;Education provided;Falls evaluation completed    MEDICARE RISK AT HOME:  Medicare Risk at Home Any stairs in or around the home?: Yes If so, are there any without handrails?: No Home free of loose throw rugs in walkways, pet beds, electrical cords, etc?: Yes Adequate lighting in your home to reduce  risk of falls?: Yes Life alert?: No Use of a cane, walker or w/c?: No Grab bars in the bathroom?: Yes Shower chair or bench in shower?: No Elevated toilet seat or a handicapped toilet?: Yes  TIMED UP AND GO:  Was the test performed?  No  Cognitive Function: 6CIT completed    03/11/2020   10:30 AM  MMSE - Mini Mental State Exam  Orientation to time 5  Orientation to Place 5  Registration 3  Attention/ Calculation 5  Recall 3  Language- name 2 objects 2  Language- repeat 1  Language- follow 3 step command 3  Language- read & follow direction 1  Write a sentence 1  Copy design 1  Total score 30        09/19/2023    9:19 AM  6CIT Screen  What Year? 0 points  What month? 0 points  What time? 0 points  Count back from 20 2 points  Months in reverse 2 points  Repeat phrase 0 points  Total Score 4 points    Immunizations  Immunization History  Administered Date(s) Administered   DTaP 08/31/2005   PFIZER(Purple Top)SARS-COV-2 Vaccination 06/14/2019, 07/04/2019   PPD Test 03/17/2014   Td 07/26/2017    Screening Tests Health Maintenance  Topic Date Due   COVID-19 Vaccine (3 - Pfizer risk series) 08/01/2019   Zoster Vaccines- Shingrix (1 of 2) 11/02/2023 (Originally 11/05/1959)   Pneumonia Vaccine 45+ Years old (1 of 1 - PCV) 08/01/2024 (Originally 11/05/1990)   INFLUENZA VACCINE  12/22/2023   Medicare Annual Wellness (AWV)  09/18/2024   DTaP/Tdap/Td (3 - Tdap) 07/27/2027   HPV VACCINES  Aged Out   Meningococcal B Vaccine  Aged Out    Health Maintenance  Health Maintenance Due  Topic Date Due   COVID-19 Vaccine (3 - Pfizer risk series) 08/01/2019   Health Maintenance Items Addressed:patient declined covid vaccine  Additional Screening:  Vision Screening: Recommended annual ophthalmology exams for early detection of glaucoma and other disorders of the eye.  Dental Screening: Recommended annual dental exams for proper oral hygiene  Community Resource  Referral / Chronic Care Management: CRR required this visit?  No   CCM required this visit?  No     Plan:     I have personally reviewed and noted the following in the patient's chart:   Medical and social history Use of alcohol, tobacco or illicit drugs  Current medications and supplements including opioid prescriptions. Patient is not currently taking opioid prescriptions. Functional ability and status Nutritional status Physical activity Advanced directives List of other physicians Hospitalizations, surgeries, and ER visits in previous 12 months Vitals Screenings to include cognitive, depression, and falls Referrals and appointments  In addition, I have reviewed and discussed with patient certain preventive protocols, quality metrics, and best practice recommendations. A written personalized care plan for preventive services as well as general preventive health recommendations were provided to patient.     Freeda Jerry, [place]   09/19/2023   After Visit Summary: (MyChart) Due to this being a telephonic visit, the after visit summary with patients personalized plan was offered to patient via MyChart   Notes: Nothing significant to report at this time.

## 2023-09-22 ENCOUNTER — Ambulatory Visit: Payer: PPO | Admitting: Internal Medicine

## 2023-09-27 DIAGNOSIS — L570 Actinic keratosis: Secondary | ICD-10-CM | POA: Diagnosis not present

## 2023-11-13 ENCOUNTER — Telehealth: Payer: Self-pay

## 2023-11-13 ENCOUNTER — Other Ambulatory Visit: Payer: Self-pay | Admitting: Family Medicine

## 2023-11-13 NOTE — Telephone Encounter (Signed)
 Left message for Michael Waller to return call to office.  Need to verify allopurinol  dose.  We have 300 mg to take 1/2 tablet daily but the refill request is for 300 mg to take one tablet daily.

## 2023-11-13 NOTE — Telephone Encounter (Signed)
 Copied from CRM [id number]. Topic: Clinical - Medication Question >> Nov 13, 2023  2:09 PM Aisha D wrote: Reason for CRM: Disregard message that was sent regarding medication refill for this pt. Accidentally sent CRM to wrong office.  Please see CRM sent to our office in error by [facility]

## 2023-11-13 NOTE — Telephone Encounter (Signed)
 Copied from CRM [id number]. Topic: Clinical - Medication Question >> Nov 13, 2023  2:09 PM Aisha D wrote: Reason for CRM: Communication Reason for CRM: Left message for Mr. Ake to return call to office.  Need to verify allopurinol  dose.  We have 300 mg to take 1/2 tablet daily but the refill request is for 300 mg to take one tablet daily.      UPDATE: Pt stated that he takes 300mg  1/2 tablet daily

## 2023-11-14 ENCOUNTER — Telehealth: Payer: Self-pay | Admitting: *Deleted

## 2023-11-14 NOTE — Telephone Encounter (Signed)
 See refill request from Monday.  This has been taken care of.

## 2023-11-14 NOTE — Telephone Encounter (Signed)
 Copied from CRM [id number]. Topic: Clinical - Medication Question >> Nov 13, 2023  2:07 PM Aisha D wrote: Reason for CRM: Left message for Mr. Mccalip to return call to office.  Need to verify allopurinol  dose.  We have 300 mg to take 1/2 tablet daily but the refill request is for 300 mg to take one tablet daily.    UPDATE: Pt stated that he takes 300mg  1/2 tablet daily

## 2023-11-30 ENCOUNTER — Other Ambulatory Visit: Payer: Self-pay | Admitting: Family Medicine

## 2023-11-30 NOTE — Telephone Encounter (Signed)
 Copied from CRM [id number]. Topic: Clinical - Medication Refill >> Nov 30, 2023  5:09 PM Sophia H wrote: Medication: bisoprolol -hydrochlorothiazide  (ZIAC ) 2.5-6.25 MG tablet  Has the patient contacted their pharmacy? Yes, pharmacy states faxed refill request with no answer back. Original provider that was filling passed which is why the patient is requesting the fill from Dr. Watt.   This is the patient's preferred pharmacy:  [facility] - [place], [place] - [address] [place] [address] Phone: [phone] Fax: [phone]   Is this the correct pharmacy for this prescription? Yes If no, delete pharmacy and type the correct one.   Has the prescription been filled recently? No, not by current provider. Previous prescribing provider passed away, this is the first time the patient is requesting a refill.   Is the patient out of the medication? Yes  Has the patient been seen for an appointment in the last year OR does the patient have an upcoming appointment? Yes, Appt scheduled in October 2025  Can we respond through MyChart? No, patient prefers phone call # [phone] (wife) Orlean   Agent: Please be advised that Rx refills may take up to 3 business days. We ask that you follow-up with your pharmacy.   ----------------------------------------------------------------------- From previous Reason for Contact - Prescription Issue: Reason for CRM:

## 2023-12-01 MED ORDER — BISOPROLOL-HYDROCHLOROTHIAZIDE 2.5-6.25 MG PO TABS: 0.5000 | ORAL_TABLET | Freq: Every day | ORAL | 1 refills | Status: AC

## 2023-12-11 ENCOUNTER — Ambulatory Visit: Admitting: Nurse Practitioner

## 2023-12-25 DIAGNOSIS — L723 Sebaceous cyst: Secondary | ICD-10-CM | POA: Diagnosis not present

## 2023-12-25 DIAGNOSIS — L570 Actinic keratosis: Secondary | ICD-10-CM | POA: Diagnosis not present

## 2023-12-25 DIAGNOSIS — L821 Other seborrheic keratosis: Secondary | ICD-10-CM | POA: Diagnosis not present

## 2024-01-26 DIAGNOSIS — L570 Actinic keratosis: Secondary | ICD-10-CM | POA: Diagnosis not present

## 2024-02-12 ENCOUNTER — Other Ambulatory Visit: Payer: Self-pay | Admitting: Nurse Practitioner

## 2024-02-14 ENCOUNTER — Other Ambulatory Visit: Payer: Self-pay | Admitting: Family Medicine

## 2024-02-14 NOTE — Telephone Encounter (Signed)
 Michael Waller notified by telephone that refill has been sent in.

## 2024-02-14 NOTE — Telephone Encounter (Signed)
 Wife called to check on prescription refill status.

## 2024-02-19 ENCOUNTER — Other Ambulatory Visit: Payer: Self-pay | Admitting: Family Medicine

## 2024-02-19 DIAGNOSIS — I10 Essential (primary) hypertension: Secondary | ICD-10-CM

## 2024-02-19 NOTE — Telephone Encounter (Signed)
 Spoke with Michael Waller to confirm how he is taking his enalapril .  He states he has 20 mg tablets and takes 1/2 tablet daily.

## 2024-02-23 ENCOUNTER — Ambulatory Visit: Admitting: Internal Medicine

## 2024-02-26 ENCOUNTER — Ambulatory Visit: Attending: Internal Medicine | Admitting: Internal Medicine

## 2024-02-26 ENCOUNTER — Encounter: Payer: Self-pay | Admitting: Internal Medicine

## 2024-02-26 VITALS — BP 146/80 | HR 49 | Ht 75.0 in | Wt 197.0 lb

## 2024-02-26 DIAGNOSIS — E782 Mixed hyperlipidemia: Secondary | ICD-10-CM

## 2024-02-26 DIAGNOSIS — R001 Bradycardia, unspecified: Secondary | ICD-10-CM | POA: Diagnosis not present

## 2024-02-26 DIAGNOSIS — R55 Syncope and collapse: Secondary | ICD-10-CM | POA: Diagnosis not present

## 2024-02-26 DIAGNOSIS — I10 Essential (primary) hypertension: Secondary | ICD-10-CM | POA: Diagnosis not present

## 2024-02-26 NOTE — Patient Instructions (Signed)
 Medication Instructions:  The current medical regimen is effective;  continue present plan and medications.  *If you need a refill on your cardiac medications before your next appointment, please call your pharmacy*  Follow-Up: At [facility], you and your health needs are our priority.  As part of our continuing mission to provide you with exceptional heart care, our providers are all part of one team.  This team includes your primary Cardiologist (physician) and Advanced Practice Providers or APPs (Physician Assistants and Nurse Practitioners) who all work together to provide you with the care you need, when you need it.  Your next appointment:   1 year(s)  Provider:   Soyla DELENA Merck, MD    We recommend signing up for the patient portal called MyChart.  Sign up information is provided on this After Visit Summary.  MyChart is used to connect with patients for Virtual Visits (Telemedicine).  Patients are able to view lab/test results, encounter notes, upcoming appointments, etc.  Non-urgent messages can be sent to your provider as well.   To learn more about what you can do with MyChart, go to [email or web address].

## 2024-02-26 NOTE — Progress Notes (Signed)
  Cardiology Office Note:  .   Date:  02/26/2024  ID:  Michael Waller, DOB 08/26/1940, MRN [id number] PCP: Watt Mirza, MD  [facility] Providers Cardiologist:  Soyla DELENA Merck, MD    History of Present Illness: .   Michael Waller is a 83 y.o. male.  Discussed the use of AI scribe software for clinical note transcription with the patient, who gave verbal consent to proceed.  History of Present Illness Michael Waller is an 83 year old male with hypertension who presents for a cardiovascular follow-up.  He has not experienced significant changes in symptoms since his last visit over a year ago. His feet tire more quickly, but there is no swelling, numbness, or unusual sensations. He does not experience lightheadedness, dizziness, or fainting.  He is on bisoprolol  HCTZ 2.5-6.25 mg (half tab), doxazosin  4 mg (half tab), enalapril  10 mg (half tab), and minoxidil  2.5 mg for blood pressure management. He also takes atorvastatin  10 mg daily and a baby aspirin  daily.  Home blood pressure readings range from 128/63 to 140/71. His heart rate increases with physical activity but remains comfortable.   He is less active than before but still engages in gardening and mission work. He attends church regularly and travels less frequently now.    ROS: negative except per HPI above.  Studies Reviewed: SABRA   EKG Interpretation Date/Time:  Monday February 26 2024 08:45:08 EDT Ventricular Rate:  49 PR Interval:  176 QRS Duration:  98 QT Interval:  432 QTC Calculation: 390 R Axis:   -15  Text Interpretation: Sinus bradycardia When compared with ECG of 15-Dec-2022 09:02, No significant change was found Confirmed by Merck Soyla ([id number]) on 02/26/2024 8:58:53 AM    Results DIAGNOSTIC EKG: Normal (02/26/2024) Risk Assessment/Calculations:       Physical Exam:   VS:  BP (!) 146/80   Pulse (!) 49   Ht 6' 3 (1.905 m)   Wt 197 lb (89.4 kg)   SpO2 96%   BMI 24.62 kg/m    Wt  Readings from Last 3 Encounters:  02/26/24 197 lb (89.4 kg)  09/19/23 200 lb (90.7 kg)  08/02/23 202 lb 2 oz (91.7 kg)     Physical Exam GENERAL: Alert, cooperative, well developed, no acute distress HEENT: Normocephalic, normal oropharynx, moist mucous membranes CHEST: Clear to auscultation bilaterally, no wheezes, rhonchi, or crackles CARDIOVASCULAR: Normal heart rate and rhythm, S1 and S2 normal without murmurs ABDOMEN: Soft, non-tender, non-distended, without organomegaly, normal bowel sounds EXTREMITIES: No cyanosis or edema NEUROLOGICAL: Cranial nerves grossly intact, moves all extremities without gross motor or sensory deficit   ASSESSMENT AND PLAN: .    Assessment and Plan Assessment & Plan Essential hypertension Hx syncope Bradycardia Blood pressure generally acceptable with occasional elevations. Current regimen includes bisoprolol  HCTZ, doxazosin , enalapril , and minoxidil . Heart rate controlled with bisoprolol , low dose, pt has not had further syncope, experiences sx tachycardia when off of BB. Future consideration to reduce medications with age while maintaining heart rate control. - Continue bisoprolol  HCTZ 2.5-6.25 mg (half tab), doxazosin  4 mg (half tab), enalapril  10 mg (half tab), and minoxidil  2.5 mg daily - Monitor blood pressure at home and report symptoms of lightheadedness, dizziness, or fainting. - Consider future reduction of antihypertensive medications with age, maintaining heart rate control.  Mixed hyperlipidemia Cholesterol levels acceptable. Managed with atorvastatin  10 mg daily. - Continue atorvastatin  10 mg daily.         Soyla Merck, MD, FACC

## 2024-03-17 NOTE — Progress Notes (Signed)
 Laureen Frederic T. Star Resler, MD, CAQ Sports Medicine [facility] [address] [place], [address]  Phone: [phone]  FAX: [phone]  TYKWON FERA - 83 y.o. male  MRN [id number]  Date of Birth: [birth year]  Date: 03/20/2024  PCP: Watt Mirza, MD  Referral: Watt Mirza, MD  No chief complaint on file.  Patient Care Team: Watt Mirza, MD as PCP - General (Family Medicine) Loni Soyla LABOR, MD as PCP - Cardiology (Cardiology) Haverstock, Tawni LITTIE, MD as Referring Physician (Dermatology) Avram Lupita BRAVO, MD as Consulting Physician (Gastroenterology) Watt Rush, MD as Attending Physician (Urology) Cleotilde Sewer, OD (Optometry) Subjective:   Michael Waller is a 83 y.o. pleasant patient who presents with the following:  Discussed the use of AI scribe software for clinical note transcription with the patient, who gave verbal consent to proceed.  History of Present Illness     Preventative Health Maintenance Visit:  Health Maintenance Summary Reviewed and updated, unless pt declines services.  Tobacco History Reviewed. Alcohol: No concerns, no excessive use Exercise Habits: Some activity, rec at least 30 mins 5 times a week STD concerns: no risk or activity to increase risk Drug Use: None  The patient is a former Dr. Tonita patient.  Multiple medical problems.  Prevnar 20 Shingrix RSV COVID booster  Chronic medical problems include hypertension, hyperlipidemia, prediabetes, gout.  Health Maintenance  Topic Date Due   Zoster Vaccines- Shingrix (1 of 2) Never done   COVID-19 Vaccine (3 - Pfizer risk series) 08/01/2019   Influenza Vaccine  Never done   Pneumococcal Vaccine: 50+ Years (1 of 1 - PCV) 08/01/2024 (Originally 11/05/1990)   Medicare Annual Wellness (AWV)  09/18/2024   DTaP/Tdap/Td (3 - Tdap) 07/27/2027   Meningococcal B Vaccine  Aged Out   Immunization History  Administered Date(s)  Administered   DTaP 08/31/2005   PFIZER(Purple Top)SARS-COV-2 Vaccination 06/14/2019, 07/04/2019   PPD Test 03/17/2014   Td 07/26/2017   Patient Active Problem List   Diagnosis Date Noted   Essential hypertension 05/22/2013    Priority: Medium    Hyperlipidemia, mixed 05/22/2013    Priority: Medium    Gastroesophageal reflux disease without esophagitis 05/14/2018    Priority: Low   Seasonal allergic rhinitis due to pollen 05/14/2018    Priority: Low   Prediabetes 05/22/2013    Priority: Low   Benign prostatic hyperplasia 05/22/2013    Priority: Low   Chronic idiopathic gout 05/22/2013    Priority: Low   History of malignant neoplasm of skin 08/12/2021   Rosacea 08/12/2021   Urolithiasis 08/12/2021   Vitamin D  deficiency 05/22/2013    Past Medical History:  Diagnosis Date   Benign localized prostatic hyperplasia with lower urinary tract symptoms (LUTS)    Elevated PSA    GERD (gastroesophageal reflux disease)    History of gout    History of kidney stones    Hyperlipidemia    Hypertension    Nephrolithiasis    Nocturia    Pre-diabetes    Vitamin D  deficiency     Past Surgical History:  Procedure Laterality Date   COLONOSCOPY  2008   CYSTOSCOPY WITH RETROGRADE PYELOGRAM, URETEROSCOPY AND STENT PLACEMENT Bilateral 04/19/2016   Procedure: CYSTOSCOPY WITH BILATERAL RETROGRADE URETEROSCOPY BASKET EXTRACTION;  Surgeon: Rush Watt, MD;  Location: [facility];  Service: Urology;  Laterality: Bilateral;   EXTRACORPOREAL SHOCK WAVE LITHOTRIPSY  [phone]   HAND LIGAMENT RECONSTRUCTION Right 2009   PROSTATE BIOPSY  2000, 12/2004   negative    Family History  Problem Relation Age of Onset   Heart disease Mother    Diabetes Mother    Diabetes Father    Heart disease Father    Heart attack Father    Diabetes Sister    Colon cancer Sister    Diabetes Brother    Pancreatic cancer Brother    Diabetes Brother    Diabetes Brother    Heart disease  Brother    Stroke Brother    Cancer Sister        breast   Breast cancer Sister    Cancer Sister        colon   Stomach cancer Neg Hx     Social History   Social History Narrative   The patient is married without children 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   1 caffeinated beverage daily.   Former user of smokeless tobacco and former smoker no current smoking drug use or alcohol    Past Medical History, Surgical History, Social History, Family History, Problem List, Medications, and Allergies have been reviewed and updated if relevant.  Review of Systems: Pertinent positives are listed above.  Otherwise, a full 14 point review of systems has been done in full and it is negative except where it is noted positive.  Objective:   There were no vitals taken for this visit. Ideal Body Weight:    Ideal Body Weight:   No results found.    09/19/2023    9:23 AM 03/19/2023   11:15 PM 10/31/2022   10:41 PM 07/25/2022   10:14 AM 04/12/2022   11:35 PM  Depression screen PHQ 2/9  Decreased Interest 0 0 0 0 0  Down, Depressed, Hopeless 0 0 0 0 0  PHQ - 2 Score 0 0 0 0 0  Altered sleeping 1      Tired, decreased energy 1      Change in appetite 0      Feeling bad or failure about yourself  0      Trouble concentrating 0      Moving slowly or fidgety/restless 0      Suicidal thoughts 0      PHQ-9 Score 2      Difficult doing work/chores Not difficult at all         GEN: well developed, well nourished, no acute distress Eyes: conjunctiva and lids normal, PERRLA, EOMI ENT: TM clear, nares clear, oral exam WNL Neck: supple, no lymphadenopathy, no thyromegaly, no JVD Pulm: clear to auscultation and percussion, respiratory effort normal CV: regular rate and rhythm, S1-S2, no murmur, rub or gallop, no bruits, peripheral pulses normal and symmetric, no cyanosis, clubbing, edema or varicosities GI: soft, non-tender; no  hepatosplenomegaly, masses; active bowel sounds all quadrants GU: deferred Lymph: no cervical, axillary or inguinal adenopathy MSK: gait normal, muscle tone and strength WNL, no joint swelling, effusions, discoloration, crepitus  SKIN: clear, good turgor, color WNL, no rashes, lesions, or ulcerations Neuro: normal mental status, normal strength, sensation, and motion Psych: alert; oriented to person, place and time, normally interactive and not anxious or depressed in appearance.  All labs reviewed with patient. Results for orders placed or performed in visit on 05/31/23  POC Hemoccult Bld/Stl (3-Cd Home Screen)   Collection Time: 05/31/23  9:21 AM  Result Value Ref Range   Card #1 Date  Fecal Occult Blood, POC Negative Negative   Card #2 Date     Card #2 Fecal Occult Blod, POC Negative    Card #3 Date     Card #3 Fecal Occult Blood, POC Negative     Assessment and Plan:     ICD-10-CM   1. Healthcare maintenance  Z00.00      Assessment & Plan   Health Maintenance Exam: The patient's preventative maintenance and recommended screening tests for an annual wellness exam were reviewed in full today. Brought up to date unless services declined.  Counselled on the importance of diet, exercise, and its role in overall health and mortality. The patient's FH and SH was reviewed, including their home life, tobacco status, and drug and alcohol status.  Follow-up in 1 year for physical exam or additional follow-up below.  Disposition: No follow-ups on file.  No orders of the defined types were placed in this encounter.  There are no discontinued medications. No orders of the defined types were placed in this encounter.   Signed,  Jacques DASEN. Leelah Hanna, MD   Allergies as of 03/20/2024       Reactions   Adhesive [tape] Other (See Comments)   reddness        Medication List        Accurate as of March 17, 2024 10:25 AM. If you have any questions, ask your nurse or  doctor.          acetaminophen  500 MG tablet Commonly known as: TYLENOL  Take 1,000 mg by mouth every 6 (six) hours as needed for mild pain.   Alfalfa 500 MG Tabs Take 1,000 mg by mouth daily.   allopurinol  300 MG tablet Commonly known as: ZYLOPRIM  Take 0.5 tablets (150 mg total) by mouth daily.   aspirin  81 MG tablet Take 81 mg by mouth daily.   atorvastatin  10 MG tablet Commonly known as: LIPITOR TAKE 1 TABLET BY MOUTH DAILY FOR CHOLESTEROL   bisoprolol -hydrochlorothiazide  2.5-6.25 MG tablet Commonly known as: ZIAC  Take 0.5 tablets by mouth daily.   blood glucose meter kit and supplies Dispense based insurance preference. E11.9   Cinnamon 500 MG Tabs Take 1,000 mg by mouth daily.   doxazosin  8 MG tablet Commonly known as: CARDURA  TAKE 1/2 TABLET BY MOUTH AT BEDTIME FOR BLOOD PRESSURE AND PROSTATE   enalapril  20 MG tablet Commonly known as: VASOTEC  Take 0.5 tablets (10 mg total) by mouth daily.   EQ Allergy Relief 10 MG tablet Generic drug: loratadine Take 10 mg by mouth daily as needed for allergies.   finasteride  5 MG tablet Commonly known as: PROSCAR  Take 5 mg by mouth every evening.   Garlic 1000 MG Caps Take 1,000 mg by mouth daily.   glucose blood test strip Check blood sugar once daily   glucose blood test strip Commonly known as: ONE TOUCH ULTRA TEST USE TO CHECK BLOOD SUGAR ONCE DAILY   OneTouch Ultra test strip Generic drug: glucose blood Check   Blood Sugar   Daily  (Dx: e11.9)   latanoprost  0.005 % ophthalmic solution Commonly known as: XALATAN  Place 1 drop into both eyes daily.   Magnesium  250 MG Tabs Take 250 mg by mouth 2 (two) times daily. Breakfast and lunch   minoxidil  2.5 MG tablet Commonly known as: LONITEN  TAKE 1-2 TABLETS BY MOUTH EVERY MORNING FOR BLOOD PRESSURE   multivitamin tablet Take 1 tablet by mouth daily.   Omega-3 Fish Oil 1200 MG Caps Take 1,200 mg by mouth daily with  lunch.   omeprazole  20 MG  capsule Commonly known as: PRILOSEC TAKE 1 CAPSULE BY MOUTH DAILY   OneTouch Delica Lancets 33G Misc Check blood sugar 1 time a day.   Vitamin D3 50 MCG (2000 UT) Tabs Take 6,000 Units by mouth daily.   zinc gluconate 50 MG tablet Take 50 mg by mouth daily.

## 2024-03-20 ENCOUNTER — Encounter: Payer: Self-pay | Admitting: Family Medicine

## 2024-03-20 ENCOUNTER — Ambulatory Visit: Admitting: Family Medicine

## 2024-03-20 VITALS — BP 168/80 | HR 58 | Temp 97.7°F | Ht 73.75 in | Wt 197.5 lb

## 2024-03-20 DIAGNOSIS — Z0000 Encounter for general adult medical examination without abnormal findings: Secondary | ICD-10-CM | POA: Diagnosis not present

## 2024-03-20 DIAGNOSIS — E782 Mixed hyperlipidemia: Secondary | ICD-10-CM

## 2024-03-20 DIAGNOSIS — R3912 Poor urinary stream: Secondary | ICD-10-CM | POA: Diagnosis not present

## 2024-03-20 DIAGNOSIS — E559 Vitamin D deficiency, unspecified: Secondary | ICD-10-CM

## 2024-03-20 DIAGNOSIS — M1A00X0 Idiopathic chronic gout, unspecified site, without tophus (tophi): Secondary | ICD-10-CM | POA: Diagnosis not present

## 2024-03-20 DIAGNOSIS — Z79899 Other long term (current) drug therapy: Secondary | ICD-10-CM

## 2024-03-20 DIAGNOSIS — R7303 Prediabetes: Secondary | ICD-10-CM | POA: Diagnosis not present

## 2024-03-20 DIAGNOSIS — Z125 Encounter for screening for malignant neoplasm of prostate: Secondary | ICD-10-CM

## 2024-03-20 DIAGNOSIS — N401 Enlarged prostate with lower urinary tract symptoms: Secondary | ICD-10-CM

## 2024-03-20 LAB — LIPID PANEL
Cholesterol: 166 mg/dL (ref 0–200)
HDL: 51.4 mg/dL (ref >39.00)
LDL Cholesterol: 97 mg/dL (ref 0–99)
NonHDL: 114.91
Total CHOL/HDL Ratio: 3
Triglycerides: 88 mg/dL (ref 0.0–149.0)
VLDL: 17.6 mg/dL (ref 0.0–40.0)

## 2024-03-20 LAB — CBC WITH DIFFERENTIAL/PLATELET
Basophils Absolute: 0 K/uL (ref 0.0–0.1)
Basophils Relative: 0.7 % (ref 0.0–3.0)
Eosinophils Absolute: 0.1 K/uL (ref 0.0–0.7)
Eosinophils Relative: 1.8 % (ref 0.0–5.0)
HCT: 42.9 % (ref 39.0–52.0)
Hemoglobin: 14.2 g/dL (ref 13.0–17.0)
Lymphocytes Relative: 24.7 % (ref 12.0–46.0)
Lymphs Abs: 1.5 K/uL (ref 0.7–4.0)
MCHC: 33.1 g/dL (ref 30.0–36.0)
MCV: 94.5 fl (ref 78.0–100.0)
Monocytes Absolute: 0.6 K/uL (ref 0.1–1.0)
Monocytes Relative: 9.2 % (ref 3.0–12.0)
Neutro Abs: 3.9 K/uL (ref 1.4–7.7)
Neutrophils Relative %: 63.6 % (ref 43.0–77.0)
Platelets: 215 K/uL (ref 150.0–400.0)
RBC: 4.54 Mil/uL (ref 4.22–5.81)
RDW: 13.9 % (ref 11.5–15.5)
WBC: 6.1 K/uL (ref 4.0–10.5)

## 2024-03-20 LAB — BASIC METABOLIC PANEL WITH GFR
BUN: 18 mg/dL (ref 6–23)
CO2: 32 meq/L (ref 19–32)
Calcium: 9.9 mg/dL (ref 8.4–10.5)
Chloride: 100 meq/L (ref 96–112)
Creatinine, Ser: 0.76 mg/dL (ref 0.40–1.50)
GFR: 83.2 mL/min (ref >60.00)
Glucose, Bld: 108 mg/dL — ABNORMAL HIGH (ref 70–99)
Potassium: 4.4 meq/L (ref 3.5–5.1)
Sodium: 140 meq/L (ref 135–145)

## 2024-03-20 LAB — VITAMIN D 25 HYDROXY (VIT D DEFICIENCY, FRACTURES): VITD: 88.27 ng/mL (ref 30.00–100.00)

## 2024-03-20 LAB — HEPATIC FUNCTION PANEL
ALT: 24 U/L (ref 0–53)
AST: 22 U/L (ref 0–37)
Albumin: 4.6 g/dL (ref 3.5–5.2)
Alkaline Phosphatase: 67 U/L (ref 39–117)
Bilirubin, Direct: 0.1 mg/dL (ref 0.0–0.3)
Total Bilirubin: 0.7 mg/dL (ref 0.2–1.2)
Total Protein: 6.9 g/dL (ref 6.0–8.3)

## 2024-03-20 LAB — PSA, MEDICARE: PSA: 1.11 ng/mL (ref 0.10–4.00)

## 2024-03-20 LAB — HEMOGLOBIN A1C: Hgb A1c MFr Bld: 5.7 % (ref 4.6–6.5)

## 2024-03-20 LAB — URIC ACID: Uric Acid, Serum: 5.9 mg/dL (ref 4.0–7.8)

## 2024-03-20 LAB — TSH: TSH: 1.51 u[IU]/mL (ref 0.35–5.50)

## 2024-03-25 ENCOUNTER — Ambulatory Visit: Payer: Self-pay | Admitting: Family Medicine

## 2024-04-02 DIAGNOSIS — R3912 Poor urinary stream: Secondary | ICD-10-CM | POA: Diagnosis not present

## 2024-04-02 DIAGNOSIS — R350 Frequency of micturition: Secondary | ICD-10-CM | POA: Diagnosis not present

## 2024-04-02 DIAGNOSIS — N401 Enlarged prostate with lower urinary tract symptoms: Secondary | ICD-10-CM | POA: Diagnosis not present

## 2024-04-02 DIAGNOSIS — R399 Unspecified symptoms and signs involving the genitourinary system: Secondary | ICD-10-CM | POA: Diagnosis not present

## 2024-04-02 DIAGNOSIS — R351 Nocturia: Secondary | ICD-10-CM | POA: Diagnosis not present

## 2024-04-02 DIAGNOSIS — N200 Calculus of kidney: Secondary | ICD-10-CM | POA: Diagnosis not present

## 2024-04-10 ENCOUNTER — Encounter: Payer: Self-pay | Admitting: General Practice

## 2024-04-10 ENCOUNTER — Ambulatory Visit (INDEPENDENT_AMBULATORY_CARE_PROVIDER_SITE_OTHER): Admitting: General Practice

## 2024-04-10 VITALS — BP 132/62 | HR 56 | Temp 97.9°F | Ht 75.0 in | Wt 204.0 lb

## 2024-04-10 DIAGNOSIS — H9201 Otalgia, right ear: Secondary | ICD-10-CM | POA: Insufficient documentation

## 2024-04-10 DIAGNOSIS — J3089 Other allergic rhinitis: Secondary | ICD-10-CM

## 2024-04-10 DIAGNOSIS — J309 Allergic rhinitis, unspecified: Secondary | ICD-10-CM | POA: Insufficient documentation

## 2024-04-10 NOTE — Patient Instructions (Addendum)
 Start claritin daily.   Drink plenty of water and rest.   Update me if your symptoms worsen or do not improve.  It was a pleasure to see you today!

## 2024-04-10 NOTE — Progress Notes (Signed)
 Established Patient Office Visit  Subjective   Patient ID: Michael Waller, male    DOB: Dec 02, 1940  Age: 83 y.o. MRN: [id number]  Chief Complaint  Patient presents with   Sinus Problem    X 1-2 weeks and right ear fullness and soreness on the outside. Patient states he has some sinus congestion but not as much now just the issue with his right ear. Patient not sure if something bit his ear or if theres wax.     Sinus Problem Associated symptoms include congestion and ear pain. Pertinent negatives include no chills, headaches, shortness of breath or sore throat.    Michael Waller is a 83 year old male, patient of Dr. Watt, presents today for an acute visit.   Discussed the use of AI scribe software for clinical note transcription with the patient, who gave verbal consent to proceed.  History of Present Illness Michael Waller is an 83 year old male who presents with sinus congestion and right ear discomfort.  He has been experiencing sinus congestion for approximately a week and a half, primarily affecting his nose and around his eyes, with a sensation of fullness in his right ear. His nose feels 'dry' and 'stopped up' rather than runny. No fever, chills, cough, shortness of breath, dizziness, headache, or other systemic symptoms.  He has been using an over-the-counter antihistamine, specifically a generic form of Claritin, intermittently over the past few days, which has helped alleviate some symptoms. He has been taking one pill daily for four to five days. Despite this, he continues to experience ear discomfort, particularly in the right ear, which feels 'like you get in a barrel.' The soreness on the outside of the ear has improved over the past few days.  He has a history of similar episodes of congestion and ear discomfort, which he attributes to allergies. He has been working outdoors, gathering leaves and maintaining two yards, which may have exacerbated his symptoms. He  typically wears a mask while working outside to mitigate allergy symptoms.  He also has glaucoma and is cautious about using nasal sprays due to this condition.    Patient Active Problem List   Diagnosis Date Noted   Allergic rhinitis 04/10/2024   Right ear pain 04/10/2024   History of malignant neoplasm of skin 08/12/2021   Rosacea 08/12/2021   Urolithiasis 08/12/2021   Gastroesophageal reflux disease without esophagitis 05/14/2018   Seasonal allergic rhinitis due to pollen 05/14/2018   Essential hypertension 05/22/2013   Hyperlipidemia, mixed 05/22/2013   Prediabetes 05/22/2013   Vitamin D  deficiency 05/22/2013   Benign prostatic hyperplasia 05/22/2013   Chronic idiopathic gout 05/22/2013   Past Medical History:  Diagnosis Date   Benign localized prostatic hyperplasia with lower urinary tract symptoms (LUTS)    Elevated PSA    GERD (gastroesophageal reflux disease)    History of gout    History of kidney stones    Hyperlipidemia    Hypertension    Nephrolithiasis    Nocturia    Pre-diabetes    Vitamin D  deficiency    Past Surgical History:  Procedure Laterality Date   COLONOSCOPY  2008   CYSTOSCOPY WITH RETROGRADE PYELOGRAM, URETEROSCOPY AND STENT PLACEMENT Bilateral 04/19/2016   Procedure: CYSTOSCOPY WITH BILATERAL RETROGRADE URETEROSCOPY BASKET EXTRACTION;  Surgeon: Norleen Seltzer, MD;  Location: [facility];  Service: Urology;  Laterality: Bilateral;   EXTRACORPOREAL SHOCK WAVE LITHOTRIPSY  [phone]   HAND LIGAMENT RECONSTRUCTION Right 2009   PROSTATE BIOPSY  2000, 12/2004   negative   Allergies  Allergen Reactions   Adhesive [Tape] Other (See Comments)    reddness         04/10/2024    3:23 PM 09/19/2023    9:23 AM 03/19/2023   11:15 PM  Depression screen PHQ 2/9  Decreased Interest 0 0 0  Down, Depressed, Hopeless 0 0 0  PHQ - 2 Score 0 0 0  Altered sleeping 0 1   Tired, decreased energy 0 1   Change in appetite 0 0   Feeling bad  or failure about yourself  0 0   Trouble concentrating 0 0   Moving slowly or fidgety/restless 0 0   Suicidal thoughts 0 0   PHQ-9 Score 0 2    Difficult doing work/chores Not difficult at all Not difficult at all      Data saved with a previous flowsheet row definition       04/10/2024    3:23 PM  GAD 7 : Generalized Anxiety Score  Nervous, Anxious, on Edge 0  Control/stop worrying 0  Worry too much - different things 0  Trouble relaxing 0  Restless 0  Easily annoyed or irritable 0  Afraid - awful might happen 0  Total GAD 7 Score 0  Anxiety Difficulty Not difficult at all      Review of Systems  Constitutional:  Negative for chills and fever.  HENT:  Positive for congestion, ear pain and sinus pain. Negative for sore throat.   Respiratory:  Negative for shortness of breath.   Cardiovascular:  Negative for chest pain.  Gastrointestinal:  Negative for abdominal pain, constipation, diarrhea, heartburn, nausea and vomiting.  Genitourinary:  Negative for dysuria, frequency and urgency.  Neurological:  Negative for dizziness and headaches.  Endo/Heme/Allergies:  Negative for polydipsia.  Psychiatric/Behavioral:  Negative for depression and suicidal ideas. The patient is not nervous/anxious.       Objective:     BP 132/62   Pulse (!) 56   Temp 97.9 F (36.6 C) (Oral)   Ht 6' 3 (1.905 m)   Wt 204 lb (92.5 kg)   SpO2 96%   BMI 25.50 kg/m  BP Readings from Last 3 Encounters:  04/10/24 132/62  03/20/24 (!) 168/80  02/26/24 (!) 146/80   Wt Readings from Last 3 Encounters:  04/10/24 204 lb (92.5 kg)  03/20/24 197 lb 8 oz (89.6 kg)  02/26/24 197 lb (89.4 kg)      Physical Exam Vitals and nursing note reviewed.  Constitutional:      Appearance: Normal appearance.  HENT:     Right Ear: Tympanic membrane, ear canal and external ear normal.     Left Ear: Tympanic membrane, ear canal and external ear normal.     Nose: Congestion present.     Mouth/Throat:      Pharynx: Oropharynx is clear.  Eyes:     Conjunctiva/sclera: Conjunctivae normal.  Cardiovascular:     Rate and Rhythm: Normal rate and regular rhythm.     Pulses: Normal pulses.     Heart sounds: Normal heart sounds.  Pulmonary:     Effort: Pulmonary effort is normal.     Breath sounds: Normal breath sounds.  Neurological:     Mental Status: He is alert and oriented to person, place, and time.  Psychiatric:        Mood and Affect: Mood normal.        Behavior: Behavior normal.  Thought Content: Thought content normal.        Judgment: Judgment normal.      No results found for any visits on 04/10/24.     The ASCVD Risk score (Arnett DK, et al., 2019) failed to calculate for the following reasons:   The 2019 ASCVD risk score is only valid for ages 6 to 48    Assessment & Plan:  Right ear pain  Allergic rhinitis due to other allergic trigger, unspecified seasonality      Assessment and Plan Assessment & Plan Allergic rhinitis Congestion likely due to allergies exacerbated by yard work. Claritin effective previously. - Take Claritin daily until yard work is completed. - Hydrate and rest. - Update if no improvement by next week.  Right ear pain Mild soreness likely allergy-related. - no red flags one exam.  - Continue daily Claritin. - Update if no improvement.    Return if symptoms worsen or fail to improve.    Carrol Aurora, NP

## 2024-06-05 ENCOUNTER — Other Ambulatory Visit: Payer: Self-pay | Admitting: Family Medicine

## 2024-06-05 DIAGNOSIS — K219 Gastro-esophageal reflux disease without esophagitis: Secondary | ICD-10-CM

## 2024-06-05 MED ORDER — OMEPRAZOLE 20 MG PO CPDR: 20.0000 mg | DELAYED_RELEASE_CAPSULE | Freq: Every day | ORAL | 3 refills | Status: AC

## 2024-06-19 ENCOUNTER — Other Ambulatory Visit: Payer: Self-pay | Admitting: Family Medicine

## 2024-09-03 ENCOUNTER — Ambulatory Visit

## 2024-09-09 ENCOUNTER — Ambulatory Visit

## 2024-09-19 ENCOUNTER — Ambulatory Visit
